# Patient Record
Sex: Male | Born: 1945 | Race: White | Hispanic: No | Marital: Married | Smoking: Former smoker
Health system: Southern US, Community
[De-identification: ages and names within clinical notes are randomized; demographics above are authoritative.]

## PROBLEM LIST (undated history)

## (undated) DIAGNOSIS — E785 Hyperlipidemia, unspecified: Secondary | ICD-10-CM

## (undated) DIAGNOSIS — I4891 Unspecified atrial fibrillation: Secondary | ICD-10-CM

## (undated) DIAGNOSIS — R0600 Dyspnea, unspecified: Secondary | ICD-10-CM

## (undated) DIAGNOSIS — I10 Essential (primary) hypertension: Secondary | ICD-10-CM

## (undated) DIAGNOSIS — I5032 Chronic diastolic (congestive) heart failure: Secondary | ICD-10-CM

## (undated) DIAGNOSIS — N400 Enlarged prostate without lower urinary tract symptoms: Secondary | ICD-10-CM

## (undated) DIAGNOSIS — E119 Type 2 diabetes mellitus without complications: Secondary | ICD-10-CM

## (undated) DIAGNOSIS — G4730 Sleep apnea, unspecified: Secondary | ICD-10-CM

## (undated) DIAGNOSIS — N184 Chronic kidney disease, stage 4 (severe): Secondary | ICD-10-CM

## (undated) DIAGNOSIS — J849 Interstitial pulmonary disease, unspecified: Secondary | ICD-10-CM

## (undated) DIAGNOSIS — T86810 Lung transplant rejection: Secondary | ICD-10-CM

## (undated) DIAGNOSIS — J449 Chronic obstructive pulmonary disease, unspecified: Secondary | ICD-10-CM

## (undated) DIAGNOSIS — R079 Chest pain, unspecified: Secondary | ICD-10-CM

## (undated) HISTORY — DX: Benign prostatic hyperplasia without lower urinary tract symptoms: N40.0

## (undated) HISTORY — DX: Essential (primary) hypertension: I10

## (undated) HISTORY — DX: Hyperlipidemia, unspecified: E78.5

## (undated) HISTORY — PX: LUNG TRANSPLANT: SHX234

## (undated) HISTORY — DX: Chest pain, unspecified: R07.9

## (undated) HISTORY — DX: Dyspnea, unspecified: R06.00

## (undated) HISTORY — DX: Type 2 diabetes mellitus without complications: E11.9

## (undated) HISTORY — PX: BRONCHOSCOPY: SUR163

---

## 2007-06-12 ENCOUNTER — Ambulatory Visit: Payer: Self-pay | Admitting: Family Medicine

## 2008-03-17 ENCOUNTER — Ambulatory Visit: Payer: Self-pay | Admitting: Internal Medicine

## 2008-03-17 ENCOUNTER — Observation Stay (HOSPITAL_COMMUNITY): Admission: EM | Admit: 2008-03-17 | Discharge: 2008-03-18 | Payer: Self-pay | Admitting: Emergency Medicine

## 2008-12-31 DIAGNOSIS — I10 Essential (primary) hypertension: Secondary | ICD-10-CM

## 2008-12-31 DIAGNOSIS — R0602 Shortness of breath: Secondary | ICD-10-CM

## 2008-12-31 DIAGNOSIS — E785 Hyperlipidemia, unspecified: Secondary | ICD-10-CM

## 2008-12-31 DIAGNOSIS — R079 Chest pain, unspecified: Secondary | ICD-10-CM | POA: Insufficient documentation

## 2010-10-12 NOTE — Consult Note (Signed)
NAMEALY, STALTER            ACCOUNT NO.:  [id number]   MEDICAL RECORD NO.:  [id number]          PATIENT TYPE:  INP   LOCATION:                               FACILITY:  [facility]   PHYSICIAN:  Shaune Pascal. Bensimhon, MD     DATE OF BIRTH:   DATE OF CONSULTATION:  03/17/2008  DATE OF DISCHARGE:                                 CONSULTATION   PRIMARY CARE PHYSICIAN/REQUESTING DOCTOR:  Dr. Carlyle Basques.   REASON FOR CONSULTATION:  Chest pain and abnormal EKG.   HISTORY OF PRESENT ILLNESS:  Mr. Caleb [name] is a very pleasant 65 year old  male with a history of obesity, hypertension, hyperlipidemia, and  diabetes.  He has no known history of coronary artery disease.  He did  have a routine stress test and  nuclear stress test about 5 years ago  which he said was done for routine screening.  He really has never had  significant chest pain.  He has never had a cardiac catheterization.   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  He took several aspirin and the pain  went away in about an hour.  Since that time, he has had essentially  chronic chest pressure which is worse with exertion.  He also notes a  marked increase in his baseline dyspnea on exertion.  He is short of  breath now just with mild to moderate activity.  He has also noticed  some mild increase in his chronic lower extremity edema.  He went to see  his primary care doctor today, Dr. Kimber Relic, who got an EKG and he is  referred here for further evaluation.   He denies any orthopnea, no PND.  No fevers, chills, no nausea and  vomiting.  No palpitations.  No syncope or presyncope.  The remainder of  review of systems is negative except for HPI and problem list.   PROBLEM LIST:  1. Hypertension.  2. Borderline hyperlipidemia.  3. Obesity.  4. History of previous normal stress tests which were done for routine      screening.  5. Diabetes x9 years.  6. BPH.   MEDICATIONS:  1. Actos plus metformin 15/850 b.i.d.  2. Enalapril 20 mg a day.  3. Lovastatin 10 a day.  4. Aspirin 81 a day.  5. Flomax 0.4 mg daily.   HE HAD NO KNOWN DRUG ALLERGIES.   SOCIAL HISTORY:  He is married with 3 children.  He lives with his wife  in [place].  He works as a Advertising copywriter.   HISTORY:  Tobacco but quit in 1980.  He drinks occasional beer.   FAMILY HISTORY:  Mother died at 39 from cancer.  Father died at 51 from  myocardial infarction.  He has 2 sisters and his brother.  His brother  is 35 and has history of pacemaker.   PHYSICAL EXAM:  He is in no acute distress.  Ambulates around the clinic  without any respiratory difficulty.  Blood pressure is 152/94.  Heart  rate 67.  HEENT:  Normal.  NECK:  Supple.  He has a thick neck.  No obvious JVD but it is very hard  to tell.  Carotids are 2+ bilaterally without bruits.  There is no  lymphadenopathy or thyromegaly.  CARDIAC:  PMI is nondisplaced.  He had a regular rate and rhythm with  S4.  No murmurs.  LUNGS:  Clear.  ABDOMEN:  Obese, nontender, nondistended.  No hepatosplenomegaly.  No  bruits.  No masses.  EXTREMITIES:  Warm with no cyanosis or clubbing.  There is 2 to 3+ edema  bilaterally.  Distal pulses are 1+ bilaterally.  No rash.  NEURO:  Alert and oriented x3.  Cranial nerves II-XII are intact.  Moves  all 4 extremities without difficulty.  Affect is pleasant.  EKG shows  normal sinus rhythm at a rate of 67.  He has LVH and a left axis  deviation.  There is loss of R-waves from V4 through V6.  There is also  question of small inferior Q-waves.   Bedside echocardiogram shows an ejection fraction of about 55%.  There  is some question of mild hypokinesis of the basilar posterior wall.  Otherwise, no obvious wall motion abnormalities.  His right ventricle  was mildly enlarged but it seems to be functioning well.   ASSESSMENT AND PLAN:  Mr. Mckinnie's symptoms are very  concerning to me  for unstable angina.  We will admit him to the hospital, start him on  aspirin, beta-blocker, nitroglycerin, statin, and heparin.  He will need  a cardiac cath in the morning.  We will also check serial cardiac  markers, as well as a D-dimer.  He will need gentle diuresis.  Given his  mildly enlarged RV, I think it is also reasonable to consider outpatient  workup for sleep apnea.   DISPOSITION:  Pending the results of his heart catheterization.      Shaune Pascal. Bensimhon, MD  Electronically Signed     DRB/MEDQ  D:  03/17/2008  T:  03/17/2008  Job:  [id number]

## 2010-10-12 NOTE — Discharge Summary (Signed)
Caleb [name], [name]            ACCOUNT NO.:  [id number]   MEDICAL RECORD NO.:  [id number]          PATIENT TYPE:  INP   LOCATION:  68                         FACILITY:  [facility]   PHYSICIAN:  Shaune Pascal. Bensimhon, MDDATE OF BIRTH:  01/30/1946   DATE OF ADMISSION:  03/17/2008  DATE OF DISCHARGE:  03/18/2008                               DISCHARGE SUMMARY   PRIMARY CARDIOLOGIST:  Shaune Pascal. Bensimhon, MD   PRIMARY CARE PHYSICIAN:  Treasa School. Erlinda Hong, MD   PROCEDURES PERFORMED DURING HOSPITALIZATION:  Cardiac catheterization  completed on January 17, 2008, by Dr. Glori Bickers revealing normal  coronary arteries and normal left ventricular function with normal right  heart pressures.   FINAL DISCHARGE DIAGNOSES:  1. Noncardiac chest pain.  2. Gastroesophageal reflux disease.  3. Diabetes.  4. Hyperlipidemia.  5. Enalapril.   HOSPITAL COURSE:  This is a 65 year old male with history of  hypertension and hyperlipidemia but no history of CAD who had complaints  of intermittent chest pain and progressive dyspnea on exertion.  Last  echocardiogram revealed an EF of 55% with questionable mild posterior  hypokinesis.  The patient was admitted to have cardiac catheterization  from our office.  Cardiac catheterization was completed and found to be  normal with no evidence of CAD.  The patient was diagnosed with  noncardiac chest pain and started on PPI.  On the day of discharge, the  patient was seen and examined by Dr. Glori Bickers and found to be  stable post-catheterization.  He will return home and follow up with his  primary care physician for continued medical management with no need for  cardiac followup at this time.   DISCHARGE LABORATORY DATA:  Hemoglobin A1c 7.8.  D-dimer 0.27.  Cholesterol 105, lipid 46, HDL 30, and LDL 66.  Troponin 0.01.  Hemoglobin 12.7, hematocrit 38.3, white blood cells 8.6, and platelets  136.  BNP 18.  Chest x-ray, cardiomegaly with minimal  right basilar  atelectasis.   DISCHARGE MEDICATIONS:  Allopurinol, Actos, lovastatin, Flomax, vitamin  D, metformin (which will be held for 48 hours post-cath), and Nexium 40  mg daily, new prescription provided.   FOLLOWUP PLANS AND APPOINTMENTS:  1. The patient has been given post-cardiac catheterization      instructions with particular emphasis on the right groin site for      evidence of bleeding, hematoma, and signs of infection.  2. The patient has no need for cardiology followup at this time.  3. The patient will follow up with primary care physician for      continued medical management.   Time spent with the patient to include physician time, 30 minutes.      Phill Myron. Purcell Nails, NP      [name], MD  Electronically Signed    KML/MEDQ  D:  03/18/2008  T:  03/19/2008  Job:  [id number]   cc:   Treasa School. Kimber Relic, MD

## 2010-10-12 NOTE — Cardiovascular Report (Signed)
NAME:  Caleb [name], Caleb [name]            ACCOUNT NO.:  [id number]   MEDICAL RECORD NO.:  [id number]           PATIENT TYPE:   LOCATION:                                 FACILITY:   PHYSICIAN:  Caleb [name], MDDATE OF BIRTH:  11/01/1945   DATE OF PROCEDURE:  03/18/2008  DATE OF DISCHARGE:                            CARDIAC CATHETERIZATION   PRIMARY CARE PHYSICIAN:  Dr. Carlyle [name], in [place].   Caleb [name] is a very pleasant 65 year old male with multiple cardiac  risk factors including diabetes, hypertension, and hyperlipidemia.  He  presented with a 4- or 5-day history of progressive chest pain and  exercise intolerance.  He was suspicious for unstable angina.  EKG  showed loss of lateral R waves concerning for previous myocardial  infarction.  He was brought to the Catheterization Lab for cardiac  catheterization.   PROCEDURES PERFORMED:  1. Right heart catheterization.  2. Left heart catheterization.  3. Left ventriculogram.  4. Selective coronary angiography.   DESCRIPTION OF PROCEDURE:  The risks and indication of the  catheterization were explained, consent was signed, and placed on the  chart.  A 5-French arterial sheath was placed in the right femoral  artery using a modified Seldinger technique.  Standard catheters  including JL4, JR4, and angled pigtail were used for procedure.  All  catheter exchanges made over wire.  No apparent complications.  A 7-  French venous sheath was placed in the right femoral vein and a standard  Swan-Ganz catheter was used for the right heart cath.  No apparent  complications.   HEMODYNAMICS:  Central aortic pressure 110/69 with a mean of 85.  LV  pressure 111/6 with an EDP of 14.  Right atrial pressure mean of 4.  RV  pressure 27/7.  PA pressure 24/69 with a mean of 22.  Wedge pressure  with a mean of 6.  Fick cardiac output was 5.8.  Cardiac index was 2.6.   Left main was normal.   LAD coursed to the apex gave off 2 diagonals,  was angiographically  normal.   Left circumflex was a large system gave off a large ramus branch,  moderate-sized OM1, and a large OM2, was angiographically normal.   Right coronary artery was a moderate-sized dominant vessel, gave off a  PDA and posterolateral, was angiographically normal.   Left ventriculogram done in the RAO position showed an EF of 55-60% with  no regional wall motion abnormalities.  On panning down over the  abdominal aorta after V-gram, there is no evidence of aneurysmal  dilatation.   ASSESSMENT:  1. Normal coronary arteries.  2. Normal left ventricular function.  3. Normal right heart pressures.  4. No evidence of abdominal aortic aneurysm.   PLAN/DISCUSSION:  Caleb [name]'s chest pain and shortness of breath do  appear noncardiac.  We will check a D-dimer to rule out pulmonary  embolus.  Given his normal right heart pressure, I think this is a low  likelihood.  If everything is negative, we will start a proton pump  inhibitor and plan an outpatient GI workup.      Caleb [name]  Caleb Agee, MD  Electronically Signed     DRB/MEDQ  D:  03/18/2008  T:  03/18/2008  Job:  [id number]   cc:   Caleb Basques, MD

## 2010-11-15 ENCOUNTER — Encounter: Payer: Self-pay | Admitting: Internal Medicine

## 2011-03-01 LAB — COMPREHENSIVE METABOLIC PANEL
AST: 20
BUN: 12
Calcium: 8.6
Glucose, Bld: 128 — ABNORMAL HIGH
Potassium: 3.8
Sodium: 140
Total Bilirubin: 0.8

## 2011-03-01 LAB — GLUCOSE, CAPILLARY
Glucose-Capillary: 150 — ABNORMAL HIGH
Glucose-Capillary: 152 — ABNORMAL HIGH

## 2011-03-01 LAB — CBC
HCT: 38.3 — ABNORMAL LOW
Hemoglobin: 12.7 — ABNORMAL LOW
MCHC: 33.3
MCV: 91.6
Platelets: 136 — ABNORMAL LOW
RBC: 4.45
RDW: 13.4
WBC: 5.9

## 2011-03-01 LAB — POCT I-STAT 3, ART BLOOD GAS (G3+)
Acid-Base Excess: 1
pO2, Arterial: 67 — ABNORMAL LOW

## 2011-03-01 LAB — TROPONIN I
Troponin I: <0.01
Troponin I: <0.01

## 2011-03-01 LAB — CK TOTAL AND CKMB (NOT AT ARMC)
Relative Index: 1.2
Total CK: 151

## 2011-03-01 LAB — APTT: aPTT: 29

## 2011-03-01 LAB — POCT I-STAT 3, VENOUS BLOOD GAS (G3P V)
Acid-base deficit: 2
Bicarbonate: 24.2 — ABNORMAL HIGH
O2 Saturation: 71
TCO2: 22
pH, Ven: 7.226 — ABNORMAL LOW
pH, Ven: 7.339 — ABNORMAL HIGH
pO2, Ven: 39
pO2, Ven: 40

## 2011-03-01 LAB — LIPID PANEL
HDL: 30 — ABNORMAL LOW
LDL Cholesterol: 66

## 2011-03-01 LAB — DIFFERENTIAL
Basophils Absolute: 0
Basophils Relative: 1
Eosinophils Relative: 2
Lymphs Abs: 2.1
Monocytes Relative: 7
Neutro Abs: 3.2

## 2011-03-01 LAB — PROTIME-INR
INR: 1
Prothrombin Time: 13.6

## 2011-03-01 LAB — B-NATRIURETIC PEPTIDE (CONVERTED LAB): Pro B Natriuretic peptide (BNP): 18

## 2011-03-01 LAB — HEMOGLOBIN A1C: Hgb A1c MFr Bld: 7.8 — ABNORMAL HIGH

## 2012-06-13 ENCOUNTER — Ambulatory Visit: Payer: Self-pay | Admitting: Nurse Practitioner

## 2012-06-13 DIAGNOSIS — J849 Interstitial pulmonary disease, unspecified: Secondary | ICD-10-CM | POA: Insufficient documentation

## 2012-06-13 DIAGNOSIS — K219 Gastro-esophageal reflux disease without esophagitis: Secondary | ICD-10-CM | POA: Insufficient documentation

## 2012-06-18 ENCOUNTER — Ambulatory Visit: Payer: Self-pay | Admitting: Nurse Practitioner

## 2012-07-30 ENCOUNTER — Ambulatory Visit: Payer: Self-pay | Admitting: Nurse Practitioner

## 2012-12-13 ENCOUNTER — Encounter: Payer: Self-pay | Admitting: Physician Assistant

## 2012-12-13 ENCOUNTER — Ambulatory Visit (INDEPENDENT_AMBULATORY_CARE_PROVIDER_SITE_OTHER): Payer: BC Managed Care – PPO | Admitting: Physician Assistant

## 2012-12-13 VITALS — BP 148/90 | HR 60 | Temp 97.5°F | Resp 18 | Ht 67.25 in | Wt 223.0 lb

## 2012-12-13 DIAGNOSIS — N400 Enlarged prostate without lower urinary tract symptoms: Secondary | ICD-10-CM

## 2012-12-13 DIAGNOSIS — E119 Type 2 diabetes mellitus without complications: Secondary | ICD-10-CM

## 2012-12-13 DIAGNOSIS — E785 Hyperlipidemia, unspecified: Secondary | ICD-10-CM

## 2012-12-13 DIAGNOSIS — I10 Essential (primary) hypertension: Secondary | ICD-10-CM

## 2012-12-13 LAB — COMPLETE METABOLIC PANEL WITH GFR
AST: 17 U/L (ref 0–37)
BUN: 13 mg/dL (ref 6–23)
Calcium: 9.2 mg/dL (ref 8.4–10.5)
Chloride: 106 mEq/L (ref 96–112)
Creat: 1.1 mg/dL (ref 0.50–1.35)

## 2012-12-13 LAB — HEMOGLOBIN A1C
Hgb A1c MFr Bld: 6 % — ABNORMAL HIGH (ref <5.7)
Mean Plasma Glucose: 126 mg/dL — ABNORMAL HIGH (ref <117)

## 2012-12-13 MED ORDER — AMLODIPINE BESYLATE 5 MG PO TABS: 5.0000 mg | ORAL_TABLET | Freq: Every day | ORAL | Status: DC

## 2012-12-13 NOTE — Progress Notes (Signed)
Patient ID: Caleb [name] MRN: [id number], DOB: March 19, 1946, 67 y.o. Date of Encounter: @DATE @  Chief Complaint:  Chief Complaint  Patient presents with  . new pt  est care  med refills    HPI: 67 y.o. year old white male  presents for initial OV here to establish care here. He says he did go to [place]. Then went to [facility] but they "moved." says he went there 3 times. LOV there was 3 months ago.   1-DM: Says he followed diabetic diet "but then lost weight too fast'-went from 240 to 207 an dfriends asked him what was wrong so he "stopped losing all that weight" and "now is at 220lb"  Checks Fasting AM BS: 100-123. Occasionally checks at other times of day.  Has decreased Glimipide to 1/2 pill b/c when took whole, BS down to 50s.  2-HTN: Takes med as directed. No adv effects 3-HLD: Takes med as directed. No myalgia or other adv effect. 4. BPH: Takes med as directed. No adv effect.   Past Medical History  Diagnosis Date  . Dyspnea   . Chest pain, unspecified        *PT SAYS HE WAS HOSPITALIZED AT  [facility] SEVERAL YEARS AGO. NOT SURE WHAT TESTS WERE DONE BUT CARDIAC TESTS WERE NEGATIVE. I LOOKED IN EPIC BUT NO RECORDS IN EPIC.MYUST HAVE BEEN PRIOR TO EPIC.  Marland Kitchen Hyperlipidemia   . Hypertension   . BPH (benign prostatic hyperplasia)   . Diabetes mellitus without complication      Home Meds: See attached medication section for current medication list. Any medications entered into computer today will not appear on this note's list. The medications listed below were entered prior to today. No current outpatient prescriptions on file prior to visit.   No current facility-administered medications on file prior to visit.    Allergies: No Known Allergies  History   Social History  . Marital Status: Married    Spouse Name: N/A    Number of Children: N/A  . Years of Education: N/A   Occupational History  . Not on file.    Social History Main Topics  . Smoking status: Former Smoker    Types: Cigarettes    Quit date: 05/16/1979  . Smokeless tobacco: Never Used     Comment: Quit 1980  . Alcohol Use: Yes  . Drug Use: No  . Sexually Active: Not on file   Other Topics Concern  . 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  Family History  Problem Relation Age of Onset  . Coronary artery disease Other   . Diabetes Other      Review of Systems:  See HPI for pertinent ROS. All other ROS negative.    Physical Exam: Blood pressure 148/90, pulse 60, temperature 97.5 F (36.4 C), temperature source Oral, resp. rate 18, height 5' 7.25" (1.708 m), weight 223 lb (101.152 kg)., Body mass index is 34.67 kg/(m^2). General: WNWD WM.Appears in no acute distress. Neck: Supple. No thyromegaly. No lymphadenopathy. No carotid bruits Lungs: Clear bilaterally to auscultation without wheezes, rales, or rhonchi. Breathing is unlabored. Heart: RRR with S1 S2. No murmurs, rubs, or gallops. Abdomen: Soft, non-tender, non-distended with normoactive bowel sounds. No hepatomegaly. No rebound/guarding. No obvious abdominal masses. Musculoskeletal:  Strength and tone normal for age. Extremities/Skin: Warm and dry. No clubbing or cyanosis. No edema. No  rashes or suspicious lesions. Neuro: Alert and oriented X 3. Moves all extremities spontaneously. Gait is normal. CNII-XII grossly in tact. Psych:  Responds to questions appropriately with a normal affect. DIABETIC FOOT EXAM; SEE ATTACHED SECTION     ASSESSMENT AND PLAN:  67 y.o. year old male with  1. Diabetes mellitus without complication Cont current meds now. Check labs . He has annual eye exams.  - COMPLETE METABOLIC PANEL WITH GFR - Hemoglobin A1c - Microalbumin, urine  2. Hyperlipidemia Cont current meds now. Pt is not fasting and cannot RTC fasting in near future sec to work schedule. Will  check other labs now. When he returns in 3 monsht, will come fasting and check FLP then. - COMPLETE METABOLIC PANEL WITH GFR  3. Hypertension He checks BP at home and has been getting high readings there also. Will go ahead and add Norvasc. Cont current meds the same.  - COMPLETE METABOLIC PANEL WITH GFR - amLODipine (NORVASC) 5 MG tablet; Take 1 tablet (5 mg total) by mouth daily.  Dispense: 90 tablet; Refill: 3  4. BPH (benign prostatic hyperplasia) Cont current meds. Check PSA. - PSA  ROV 3 months. Come fasting so can check FLP at that time.  F/U sooner if needed.  [address] [place], [place], [facility] 12/13/2012 10:28 AM

## 2012-12-14 ENCOUNTER — Encounter: Payer: Self-pay | Admitting: Family Medicine

## 2013-01-24 ENCOUNTER — Telehealth: Payer: Self-pay | Admitting: Physician Assistant

## 2013-01-24 MED ORDER — PANTOPRAZOLE SODIUM 40 MG PO TBEC: 40.0000 mg | DELAYED_RELEASE_TABLET | Freq: Every day | ORAL | Status: DC

## 2013-01-24 NOTE — Telephone Encounter (Signed)
Medication refilled per protocol. 

## 2013-02-22 ENCOUNTER — Other Ambulatory Visit: Payer: Self-pay | Admitting: Physician Assistant

## 2013-02-22 NOTE — Telephone Encounter (Signed)
One month refill future appt pending

## 2013-02-28 ENCOUNTER — Other Ambulatory Visit: Payer: Self-pay | Admitting: Physician Assistant

## 2013-02-28 NOTE — Telephone Encounter (Signed)
One month refill sent .  Pt has 3 mth f/u appt later this month

## 2013-03-18 ENCOUNTER — Encounter: Payer: Self-pay | Admitting: Physician Assistant

## 2013-03-18 ENCOUNTER — Ambulatory Visit (INDEPENDENT_AMBULATORY_CARE_PROVIDER_SITE_OTHER): Payer: BC Managed Care – PPO | Admitting: Physician Assistant

## 2013-03-18 VITALS — BP 118/80 | HR 78 | Temp 97.1°F | Resp 18 | Wt 220.5 lb

## 2013-03-18 DIAGNOSIS — N400 Enlarged prostate without lower urinary tract symptoms: Secondary | ICD-10-CM

## 2013-03-18 DIAGNOSIS — E785 Hyperlipidemia, unspecified: Secondary | ICD-10-CM

## 2013-03-18 DIAGNOSIS — I10 Essential (primary) hypertension: Secondary | ICD-10-CM

## 2013-03-18 DIAGNOSIS — Z23 Encounter for immunization: Secondary | ICD-10-CM

## 2013-03-18 DIAGNOSIS — E119 Type 2 diabetes mellitus without complications: Secondary | ICD-10-CM

## 2013-03-18 LAB — COMPLETE METABOLIC PANEL WITH GFR
ALT: 21 U/L (ref 0–53)
AST: 17 U/L (ref 0–37)
Albumin: 4.1 g/dL (ref 3.5–5.2)
CO2: 25 mEq/L (ref 19–32)
Calcium: 9.1 mg/dL (ref 8.4–10.5)
Chloride: 106 mEq/L (ref 96–112)
Creat: 1.01 mg/dL (ref 0.50–1.35)
GFR, Est African American: >89 mL/min
GFR, Est Non African American: 78 mL/min
Glucose, Bld: 122 mg/dL — ABNORMAL HIGH (ref 70–99)
Total Bilirubin: 1 mg/dL (ref 0.3–1.2)

## 2013-03-18 LAB — LIPID PANEL
HDL: 38 mg/dL — ABNORMAL LOW (ref >39)
LDL Cholesterol: 74 mg/dL (ref 0–99)
Triglycerides: 97 mg/dL (ref <150)

## 2013-03-18 LAB — HEMOGLOBIN A1C
Hgb A1c MFr Bld: 6.4 % — ABNORMAL HIGH (ref <5.7)
Mean Plasma Glucose: 137 mg/dL — ABNORMAL HIGH (ref <117)

## 2013-03-18 NOTE — Progress Notes (Signed)
Patient ID: Caleb [name] MRN: [id number], DOB: 16-Jul-1945, 67 y.o. Date of Encounter: @DATE @  Chief Complaint:  Chief Complaint  Patient presents with  . 3 mos follow up  . Blood work    HPI: 67 y.o. year old white male  presents for a routine followup office visit.  I saw him as a new patient to our office to establish care here on 12/13/12. Prior to that he had gone to [facility] with Dr.  Darliss Ridgel. Then he went to [facility] but they  'moved." His last office visit with them was just 3 months prior to his initial visit with me in July.  #1 diabetes: His with me in July he stated that he was following a diabetic diet "but then lost weight too fast and went from 240-207 and friends asked him what was wrong so he stopped losing all that weight." Today I asked him about his diet. He says  the biggest thing was he has stopped bread and stopped soda. Says he does follow the diabetic diet for the most part. Reports fasting morning blood sugar is about 105-132 2 hours postprandials about 147.  #2 hypertension: Last office visit his blood pressure was slightly elevated. He reported that he was also getting slightly high readings at home. Therefore we went ahead and added Norvasc 5 mg. He says that he did add Norvasc. He is taking it daily with no adverse effects. Says he is getting about 120/80 for his blood pressure readings at home.  3 hyperlipidemia: He takes medication as directed. No myalgias or other adverse effects.  4 BPH: He is taking his medications as directed. Has no adverse effects. His symptoms are well-controlled with medications.  He went on vacation for one week last week. He says that he was noncompliant with diet during that time. however states that he did have time to do a lot of walking --Felt good with this.   Past Medical History  Diagnosis Date  . Dyspnea   . Chest pain, unspecified   . Hyperlipidemia   . Hypertension   . BPH  (benign prostatic hyperplasia)   . Diabetes mellitus without complication      Home Meds: See attached medication section for current medication list. Any medications entered into computer today will not appear on this note's list. The medications listed below were entered prior to today. Current Outpatient Prescriptions on File Prior to Visit  Medication Sig Dispense Refill  . amLODipine (NORVASC) 5 MG tablet Take 1 tablet (5 mg total) by mouth daily.  90 tablet  3  . aspirin 81 MG tablet Take 81 mg by mouth daily.      . Cholecalciferol (D3 MAXIMUM STRENGTH) 5000 UNITS capsule Take 5,000 Units by mouth daily.      Marland Kitchen dutasteride (AVODART) 0.5 MG capsule Take 0.5 mg by mouth daily.      . enalapril (VASOTEC) 20 MG tablet Take 20 mg by mouth daily.      Marland Kitchen glimepiride (AMARYL) 2 MG tablet Take 2 mg by mouth daily before breakfast.      . KOMBIGLYZE XR 2.09-998 MG TB24 TAKE 2 TABLETS BY MOUTH EVERY DAY  60 tablet  0  . lovastatin (MEVACOR) 20 MG tablet TAKE 1 TABLET BY MOUTH ONCE A DAY  30 tablet  0  . meloxicam (MOBIC) 7.5 MG tablet Take 7.5 mg by mouth daily.      . pantoprazole (PROTONIX) 40 MG tablet Take 1 tablet (40  mg total) by mouth daily.  30 tablet  3  . tamsulosin (FLOMAX) 0.4 MG CAPS Take 0.4 mg by mouth daily after breakfast.       No current facility-administered medications on file prior to visit.    Allergies: No Known Allergies  History   Social History  . Marital Status: Married    Spouse Name: N/A    Number of Children: N/A  . Years of Education: N/A   Occupational History  . Not on file.   Social History Main Topics  . Smoking status: Former Smoker    Types: Cigarettes    Quit date: 05/16/1979  . Smokeless tobacco: Never Used     Comment: Quit 1980  . Alcohol Use: Yes  . Drug Use: No  . Sexual Activity: Not on file   Other Topics Concern  . 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     Family  History  Problem Relation Age of Onset  . Coronary artery disease Other   . Diabetes Other      Review of Systems:  See HPI for pertinent ROS. All other ROS negative.    Physical Exam: Blood pressure 118/80, pulse 78, temperature 97.1 F (36.2 C), temperature source Oral, resp. rate 18, weight 220 lb 8 oz (100.018 kg)., Body mass index is 34.28 kg/(m^2). General: Well-nourished well-developed white male . Appears in no acute distress. Head: Normocephalic, atraumatic, eyes without discharge, sclera non-icteric, nares are without discharge. Bilateral auditory canals clear, TM's are without perforation, pearly grey and translucent with reflective cone of light bilaterally. Oral cavity moist, posterior pharynx without exudate, erythema, peritonsillar abscess, or post nasal drip.  Neck: Supple. No thyromegaly. No lymphadenopathy. No carotid bruits. Lungs: Clear bilaterally to auscultation without wheezes, rales, or rhonchi. Breathing is unlabored. Heart: RRR with S1 S2. No murmurs, rubs, or gallops. Abdomen: Soft, non-tender, non-distended with normoactive bowel sounds. No hepatomegaly. No rebound/guarding. No obvious abdominal masses. Musculoskeletal:  Strength and tone normal for age. Extremities/Skin: Warm and dry. No clubbing or cyanosis. No edema. No rashes or suspicious lesions. Neuro: Alert and oriented X 3. Moves all extremities spontaneously. Gait is normal. CNII-XII grossly in tact. Psych:  Responds to questions appropriately with a normal affect. Diabetic foot exam:  Inspection is normal. Sensation is normal.  2+ bilateral dorsalis pedis pulses. 1+ bilateral posterior tibial pulses      ASSESSMENT AND PLAN:  67 y.o. year old male with  1. Diabetes mellitus without complication - COMPLETE METABOLIC PANEL WITH GFR - Hemoglobin A1c Microalbumin normal 7/14. He has annual eye exam. Diabetic foot exam is within normal limits.  2. Hyperlipidemia - COMPLETE METABOLIC PANEL WITH  GFR - Lipid panel  3. Hypertension BP is now at goal on  Current medications. - COMPLETE METABOLIC PANEL WITH GFR  4. BPH (benign prostatic hyperplasia) - COMPLETE METABOLIC PANEL WITH GFR  5. greening colonoscopy: He reports he has had one colonoscopy in the past. He thinks it was about 7 or 8 years ago. On the one hand says that they told him it looked good. On the other hand he says that they saw a couple of small polyps. He does not know when he is supposed to have a repeat colonoscopy. Says that that colonoscopy was done at [facility]. He will talk to his wife about this as far as trying to find a copy of the report etc.  #6 screening PSA: This  was done in 12/13/12 and was normal  #7 immunizations: Pneumovax: He is now age 89. He says that he does not recall having any Pneumovax before and especially definitely has not had one in the past year since being 89. Therefore we'll give this today. Influenza vaccine: We'll get this today. Tetanus vaccine: He cannot remember the last 20 has and is quite certain it's been at least 10 years. He is due for this. However since forgetting the name of the knee and flu vaccine today we will await and give tetanus vaccine at his next visit.  Regular office visit in 3 months. GIVE TETANUS VACCINE AT NEXT OV  Signed, [address] [place], [place], [facility] 03/18/2013 8:33 AM

## 2013-03-25 ENCOUNTER — Encounter: Payer: Self-pay | Admitting: Family Medicine

## 2013-04-01 ENCOUNTER — Other Ambulatory Visit: Payer: Self-pay | Admitting: Physician Assistant

## 2013-04-01 NOTE — Telephone Encounter (Signed)
Medication refilled per protocol. 

## 2013-04-07 ENCOUNTER — Emergency Department: Payer: Self-pay | Admitting: Emergency Medicine

## 2013-04-07 LAB — CBC
HCT: 43 % (ref 40.0–52.0)
HGB: 14.8 g/dL (ref 13.0–18.0)
MCH: 30.6 pg (ref 26.0–34.0)
MCHC: 34.4 g/dL (ref 32.0–36.0)
RBC: 4.84 10*6/uL (ref 4.40–5.90)
RDW: 13.2 % (ref 11.5–14.5)
WBC: 8.5 10*3/uL (ref 3.8–10.6)

## 2013-04-07 LAB — COMPREHENSIVE METABOLIC PANEL
Albumin: 3.5 g/dL (ref 3.4–5.0)
Anion Gap: 5 — ABNORMAL LOW (ref 7–16)
BUN: 14 mg/dL (ref 7–18)
Chloride: 110 mmol/L — ABNORMAL HIGH (ref 98–107)
EGFR (African American): >60
EGFR (Non-African Amer.): >60
Potassium: 3.8 mmol/L (ref 3.5–5.1)
SGPT (ALT): 31 U/L (ref 12–78)
Sodium: 141 mmol/L (ref 136–145)
Total Protein: 7.5 g/dL (ref 6.4–8.2)

## 2013-04-07 LAB — URINALYSIS, COMPLETE
Bilirubin,UR: NEGATIVE
Blood: NEGATIVE
Glucose,UR: NEGATIVE mg/dL (ref 0–75)
Ketone: NEGATIVE
Leukocyte Esterase: NEGATIVE
Protein: NEGATIVE
Specific Gravity: 1.014 (ref 1.003–1.030)
Squamous Epithelial: NONE SEEN

## 2013-04-07 LAB — TROPONIN I: Troponin-I: <0.02 ng/mL

## 2013-04-07 LAB — PRO B NATRIURETIC PEPTIDE: B-Type Natriuretic Peptide: 53 pg/mL (ref 0–125)

## 2013-04-11 ENCOUNTER — Other Ambulatory Visit: Payer: Self-pay | Admitting: Physician Assistant

## 2013-04-29 ENCOUNTER — Encounter: Payer: Self-pay | Admitting: Physician Assistant

## 2013-04-29 ENCOUNTER — Ambulatory Visit (INDEPENDENT_AMBULATORY_CARE_PROVIDER_SITE_OTHER): Payer: BC Managed Care – PPO | Admitting: Physician Assistant

## 2013-04-29 VITALS — BP 122/86 | HR 72 | Temp 97.4°F | Resp 18 | Wt 223.0 lb

## 2013-04-29 DIAGNOSIS — R0602 Shortness of breath: Secondary | ICD-10-CM

## 2013-04-29 NOTE — Progress Notes (Signed)
Patient ID: Caleb [name] MRN: [id number], DOB: [birth year], 67 y.o. Date of Encounter: @DATE @  Chief Complaint:  Chief Complaint  Patient presents with  . c/o SOB, fatique x 1 mth    seen [place]  lung mass/chronic lung infection, seen Pulm in [place] has had on levaquin    HPI: 67 y.o. year old white male  presents with his wife today.  I have seen the patient twice before.  I saw him as a new patient to establish care here on 12/13/12. I then saw him again for followup visit on 03/18/13. I have been seeing him for diabetes, hypertension, hyperlipidemia. When he came here for his initial visit he said that prior to that he had gone to [facility] with Dr. Brunetta Genera. Then he went to [facility] family practice but they "moved."  Today he and his wife report the following information. Records are currently pending. None of the records are in the epic computer system.  He reports that he had been experiencing shortness of breath with exertion for approximately one month. However it worsened.   Also, he had an episode where he was laying down working on a truck and felt kind of lightheaded. He then started to go in the house and "felt staggery as if he was drunk" when he was walking. His wife was in the house and saw him " walking as if he was drunk".  She took him to the [facility] walk[facility]in [facility]. This was on 04/07/13. They report that they did a chest x-ray there which showed a " mass in the lung ". They sent him to the emergency room where they did a CT scan. This was also on 04/07/13. They say that the CT scan showed "chronic lung infection". They report that the ER prescribed Levaquin 750 mg daily x7 days. He took this from 11/10 through 11/17.  As well the ER had him follow up with Dr. Chancy Milroy a pulmonologist in [place]. This visit was on 04/08/13. He told the patient to take the Levaquin as prescribed from the ER. As well he gave him samples of BREO.  Patient states that he  felt better while on the antibiotics. However after he completed the antibiotics the symptoms began to recur.  He says that he went for an echocardiogram at Dr. Ruben Gottron on 04/18/13. Patient states that he has not received the results from that test.  Patient states that because the symptoms were returning once he had gotten off of the antibiotics he had called Dr. Marella Bile office on the morning of 11/26. However he got no call back until he got a call on the evening of 11/28 from Dr. Marella Bile nurse. Patient states that he had had some cramping in his hands on the Levaquin 750 mg. Therefore they prescribed a lower dose. They prescribed Levaquin 500 mg daily x10 days.  Patient has now been back on the Levaquin for 4 days. He says that his breathing is improving.  His wife states that what happens is if he does any exertion he becomes short of breath and starts to cough and sweats and coughs up small amounts of mucus/phlegm. She says that sometimes he continues the cycle of cough, shortness of breath, cough for 30 minutes. He has had no fevers or chills. He reports that he is scheduled to go for pulmonary function tests at Dr. Ruben Gottron office on 05/01/13. His next appointment with Dr. Carren Rang is not until 05/20/13. Patient says that he came here because  he called [place] and we were able to get him an appointment to come in, whereas when he called Dr. Ruben Gottron office he had trouble getting through with them. As well he says that he feels as if they are not telling him everything. If he would rather have [place] manage this problem.   Past Medical History  Diagnosis Date  . Dyspnea   . Chest pain, unspecified   . Hyperlipidemia   . Hypertension   . BPH (benign prostatic hyperplasia)   . Diabetes mellitus without complication      Home Meds: See attached medication section for current medication list. Any medications entered into computer today will not appear on this note's list. The medications listed below were  entered prior to today. Current Outpatient Prescriptions on File Prior to Visit  Medication Sig Dispense Refill  . amLODipine (NORVASC) 5 MG tablet Take 1 tablet (5 mg total) by mouth daily.  90 tablet  3  . aspirin 81 MG tablet Take 81 mg by mouth daily.      . Cholecalciferol (D3 MAXIMUM STRENGTH) 5000 UNITS capsule Take 5,000 Units by mouth daily.      Marland Kitchen dutasteride (AVODART) 0.5 MG capsule Take 0.5 mg by mouth daily.      . enalapril (VASOTEC) 20 MG tablet TAKE 1 TABLET BY MOUTH EVERY DAY  30 tablet  3  . glimepiride (AMARYL) 2 MG tablet Take 2 mg by mouth daily before breakfast.      . KOMBIGLYZE XR 2.09-998 MG TB24 TAKE 2 TABLETS BY MOUTH EVERY DAY  60 tablet  2  . lovastatin (MEVACOR) 20 MG tablet TAKE 1 TABLET BY MOUTH EVERY DAY  30 tablet  2  . meloxicam (MOBIC) 7.5 MG tablet Take 7.5 mg by mouth daily.      . pantoprazole (PROTONIX) 40 MG tablet Take 1 tablet (40 mg total) by mouth daily.  30 tablet  3  . tamsulosin (FLOMAX) 0.4 MG CAPS Take 0.4 mg by mouth daily after breakfast.       No current facility-administered medications on file prior to visit.    Allergies: No Known Allergies  History   Social History  . Marital Status: Married    Spouse Name: N/A    Number of Children: N/A  . Years of Education: N/A   Occupational History  . Not on file.   Social History Main Topics  . Smoking status: Former Smoker    Types: Cigarettes    Quit date: 05/16/1979  . Smokeless tobacco: Never Used     Comment: Quit 1980  . Alcohol Use: Yes  . Drug Use: No  . Sexual Activity: Not on file   Other Topics Concern  . 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     Family History  Problem Relation Age of Onset  . Coronary artery disease Other   . Diabetes Other      Review of Systems:  See HPI for pertinent ROS. All other ROS negative.    Physical Exam: Blood pressure 122/86, pulse 72, temperature 97.4 F (36.3 C),  temperature source Oral, resp. rate 18, weight 223 lb (101.152 kg), SpO2 94.00%., Body mass index is 34.67 kg/(m^2). General: WNWD WM. Appears in no acute distress. Lungs: Clear bilaterally to auscultation without wheezes, rales, or rhonchi. Breathing is unlabored. Heart: RRR with S1 S2. No murmurs, rubs, or gallops. Musculoskeletal:  Strength and tone normal for age. Extremities/Skin:  Warm and dry. No clubbing or cyanosis. No edema. No rashes or suspicious lesions. Neuro: Alert and oriented X 3. Moves all extremities spontaneously. Gait is normal. CNII-XII grossly in tact. Psych:  Responds to questions appropriately with a normal affect.     ASSESSMENT AND PLAN:  67 y.o. year old male with  1. DYSPNEA His lungs are pretty clear right now. I hear no wheezing. Good air movement and breath sounds. Patient has signed release for all of the records. I will follow up on making sure that we get all of these records.-- Including CT from the ER, echo report from Dr. Marella Bile office, office visit from Dr. Carren Rang. I told patient to keep the appointment for the pulmonary function tests scheduled at Dr. Marella Bile office. I will follow the patient as soon as I get these records. Otherwise continue current treatment in the meantime.  30 minutes spent with the patient and his wife as well as time obtaining and reviewing all the records. Additionally, once I obtain these records I will have to notify the patient and discuss results with him.  [address] [place], [place], [facility] 04/29/2013 11:39 AM

## 2013-05-02 ENCOUNTER — Telehealth: Payer: Self-pay | Admitting: Physician Assistant

## 2013-05-02 NOTE — Telephone Encounter (Signed)
See my office note dated 04/29/13 for details about this issue. At that time the patient had recently gone to urgent care, then emergency room then Dr. Yancey Flemings pulmonologist in [place]. At the end of that visit, I told the patient that I would obtain records and review them and follow up with him. Have obtained and reviewed all of the records. This included records from the urgent care, ER, and Dr. Yancey Flemings. I reviewed reports of head CT, chest CT, echocardiogram.  I told him to continue to followup with Dr. Yancey Flemings as scheduled. I saw nothing to add to the evaluation and treatment that was already being done by Dr. Chancy Milroy.  Patient was agreeable with this approach. He will continue to follow  Up with  Dr. Yancey Flemings for this problem.

## 2013-05-14 ENCOUNTER — Other Ambulatory Visit: Payer: Self-pay | Admitting: Internal Medicine

## 2013-05-14 DIAGNOSIS — K219 Gastro-esophageal reflux disease without esophagitis: Secondary | ICD-10-CM

## 2013-05-16 ENCOUNTER — Other Ambulatory Visit: Payer: Self-pay | Admitting: Internal Medicine

## 2013-05-16 ENCOUNTER — Ambulatory Visit
Admission: RE | Admit: 2013-05-16 | Discharge: 2013-05-16 | Disposition: A | Discharge status: Home / Self Care | Payer: Medicare Other | Source: Ambulatory Visit | Attending: Internal Medicine | Admitting: Internal Medicine

## 2013-05-16 DIAGNOSIS — K219 Gastro-esophageal reflux disease without esophagitis: Secondary | ICD-10-CM

## 2013-05-20 ENCOUNTER — Other Ambulatory Visit: Payer: Self-pay | Admitting: Physician Assistant

## 2013-05-20 NOTE — Telephone Encounter (Signed)
Medication refilled per protocol. 

## 2013-05-26 ENCOUNTER — Other Ambulatory Visit: Payer: Self-pay | Admitting: Physician Assistant

## 2013-06-06 ENCOUNTER — Other Ambulatory Visit: Payer: Self-pay | Admitting: Physician Assistant

## 2013-06-06 NOTE — Telephone Encounter (Signed)
Medication refilled per protocol. 

## 2013-06-08 ENCOUNTER — Other Ambulatory Visit: Payer: Self-pay | Admitting: Physician Assistant

## 2013-06-14 ENCOUNTER — Ambulatory Visit: Payer: Self-pay | Admitting: Internal Medicine

## 2013-06-17 ENCOUNTER — Emergency Department (HOSPITAL_COMMUNITY): Payer: Medicare Other

## 2013-06-17 ENCOUNTER — Encounter (HOSPITAL_COMMUNITY): Payer: Self-pay | Admitting: Emergency Medicine

## 2013-06-17 ENCOUNTER — Ambulatory Visit: Payer: BC Managed Care – PPO | Admitting: Physician Assistant

## 2013-06-17 ENCOUNTER — Emergency Department (HOSPITAL_COMMUNITY)
Admission: EM | Admit: 2013-06-17 | Discharge: 2013-06-17 | Disposition: A | Discharge status: Home / Self Care | Payer: Medicare Other | Attending: Emergency Medicine | Admitting: Emergency Medicine

## 2013-06-17 DIAGNOSIS — R0989 Other specified symptoms and signs involving the circulatory and respiratory systems: Secondary | ICD-10-CM | POA: Insufficient documentation

## 2013-06-17 DIAGNOSIS — Z87891 Personal history of nicotine dependence: Secondary | ICD-10-CM | POA: Insufficient documentation

## 2013-06-17 DIAGNOSIS — Z79899 Other long term (current) drug therapy: Secondary | ICD-10-CM | POA: Insufficient documentation

## 2013-06-17 DIAGNOSIS — E119 Type 2 diabetes mellitus without complications: Secondary | ICD-10-CM | POA: Insufficient documentation

## 2013-06-17 DIAGNOSIS — R079 Chest pain, unspecified: Secondary | ICD-10-CM | POA: Insufficient documentation

## 2013-06-17 DIAGNOSIS — E785 Hyperlipidemia, unspecified: Secondary | ICD-10-CM | POA: Insufficient documentation

## 2013-06-17 DIAGNOSIS — R110 Nausea: Secondary | ICD-10-CM | POA: Insufficient documentation

## 2013-06-17 DIAGNOSIS — R109 Unspecified abdominal pain: Secondary | ICD-10-CM

## 2013-06-17 DIAGNOSIS — I10 Essential (primary) hypertension: Secondary | ICD-10-CM | POA: Insufficient documentation

## 2013-06-17 DIAGNOSIS — J8410 Pulmonary fibrosis, unspecified: Secondary | ICD-10-CM

## 2013-06-17 DIAGNOSIS — J84112 Idiopathic pulmonary fibrosis: Secondary | ICD-10-CM | POA: Insufficient documentation

## 2013-06-17 DIAGNOSIS — R0609 Other forms of dyspnea: Secondary | ICD-10-CM | POA: Insufficient documentation

## 2013-06-17 DIAGNOSIS — R10819 Abdominal tenderness, unspecified site: Secondary | ICD-10-CM | POA: Insufficient documentation

## 2013-06-17 LAB — URINALYSIS, ROUTINE W REFLEX MICROSCOPIC
Bilirubin Urine: NEGATIVE
Glucose, UA: NEGATIVE mg/dL
Hgb urine dipstick: NEGATIVE
KETONES UR: NEGATIVE mg/dL
LEUKOCYTES UA: NEGATIVE
NITRITE: NEGATIVE
PROTEIN: NEGATIVE mg/dL
Specific Gravity, Urine: 1.006 (ref 1.005–1.030)
UROBILINOGEN UA: 0.2 mg/dL (ref 0.0–1.0)
pH: 5.5 (ref 5.0–8.0)

## 2013-06-17 LAB — CBC WITH DIFFERENTIAL/PLATELET
Basophils Absolute: 0 10*3/uL (ref 0.0–0.1)
Basophils Relative: 0 % (ref 0–1)
Eosinophils Absolute: 0.2 10*3/uL (ref 0.0–0.7)
Eosinophils Relative: 2 % (ref 0–5)
HEMATOCRIT: 40.8 % (ref 39.0–52.0)
HEMOGLOBIN: 14.4 g/dL (ref 13.0–17.0)
LYMPHS ABS: 2.2 10*3/uL (ref 0.7–4.0)
Lymphocytes Relative: 22 % (ref 12–46)
MCH: 30.4 pg (ref 26.0–34.0)
MCHC: 35.3 g/dL (ref 30.0–36.0)
MCV: 86.3 fL (ref 78.0–100.0)
MONOS PCT: 7 % (ref 3–12)
Monocytes Absolute: 0.7 10*3/uL (ref 0.1–1.0)
NEUTROS PCT: 68 % (ref 43–77)
Neutro Abs: 6.7 10*3/uL (ref 1.7–7.7)
Platelets: 166 10*3/uL (ref 150–400)
RBC: 4.73 MIL/uL (ref 4.22–5.81)
RDW: 12.7 % (ref 11.5–15.5)
WBC: 9.9 10*3/uL (ref 4.0–10.5)

## 2013-06-17 LAB — COMPREHENSIVE METABOLIC PANEL
ALK PHOS: 69 U/L (ref 39–117)
ALT: 19 U/L (ref 0–53)
AST: 16 U/L (ref 0–37)
Albumin: 3.1 g/dL — ABNORMAL LOW (ref 3.5–5.2)
BILIRUBIN TOTAL: 0.4 mg/dL (ref 0.3–1.2)
BUN: 15 mg/dL (ref 6–23)
CHLORIDE: 103 meq/L (ref 96–112)
CO2: 20 mEq/L (ref 19–32)
Calcium: 8.3 mg/dL — ABNORMAL LOW (ref 8.4–10.5)
Creatinine, Ser: 0.97 mg/dL (ref 0.50–1.35)
GFR, EST NON AFRICAN AMERICAN: 85 mL/min — AB (ref >90)
Glucose, Bld: 219 mg/dL — ABNORMAL HIGH (ref 70–99)
POTASSIUM: 4.3 meq/L (ref 3.7–5.3)
Sodium: 137 mEq/L (ref 137–147)
Total Protein: 6.7 g/dL (ref 6.0–8.3)

## 2013-06-17 LAB — LIPASE, BLOOD: Lipase: 46 U/L (ref 11–59)

## 2013-06-17 MED ORDER — DEXTROSE 50 % IV SOLN
INTRAVENOUS | Status: AC
  Filled 2013-06-17: qty 50

## 2013-06-17 MED ORDER — IOHEXOL 300 MG/ML  SOLN
80.0000 mL | Freq: Once | INTRAMUSCULAR | Status: AC | PRN
  Administered 2013-06-17: 80 mL via INTRAVENOUS

## 2013-06-17 MED ORDER — SODIUM CHLORIDE 0.9 % IV BOLUS (SEPSIS)
1000.0000 mL | Freq: Once | INTRAVENOUS | Status: AC
  Administered 2013-06-17: 1000 mL via INTRAVENOUS

## 2013-06-17 MED ORDER — ONDANSETRON 4 MG PO TBDP: 4.0000 mg | ORAL_TABLET | Freq: Three times a day (TID) | ORAL | Status: DC | PRN

## 2013-06-17 MED ORDER — IOHEXOL 300 MG/ML  SOLN
25.0000 mL | INTRAMUSCULAR | Status: AC
  Administered 2013-06-17: 25 mL via ORAL

## 2013-06-17 MED ORDER — MORPHINE SULFATE 4 MG/ML IJ SOLN: 4.0000 mg | Freq: Once | INTRAMUSCULAR | Status: DC

## 2013-06-17 MED ORDER — ONDANSETRON HCL 4 MG/2ML IJ SOLN
4.0000 mg | Freq: Once | INTRAMUSCULAR | Status: AC
  Administered 2013-06-17: 4 mg via INTRAVENOUS
  Filled 2013-06-17: qty 2

## 2013-06-17 NOTE — ED Provider Notes (Signed)
CSN: [id number]     Arrival date & time 06/17/13  [id number] History   First MD Initiated Contact with Patient 06/17/13 [phone]     Chief Complaint  Patient presents with  . Abdominal Pain   (Consider location/radiation/quality/duration/timing/severity/associated sxs/prior Treatment) HPI Comments: Patient is a 68 yo M PHMx significant for HLD, HTN, BPH, DM presenting to the ED for acute onset severe burning cramping lower abdominal pain that began around 6AM this morning after eating breakfast. The patient states he had associated chills and nausea. He states that abdominal pain has subsided, but he continues to have some nausea. Patient denies any history of this abdominal pain in the past. He states he is being followed by his doctor for recurrent lung infections that he was placed on month long antibiotics two months ago. He had a CT of his chest on Friday at [facility] outpatient imaging center. Patient denies any history of abdominal surgeries. He denies any fevers, emesis, diarrhea, urinary symptoms, CP, new or worsening SOB or cough from chronic SOB and cough. No other acute changes with chronic pulmonary issues.    Patient is a 68 y.o. male presenting with abdominal pain.  Abdominal Pain Associated symptoms: chills and nausea   Associated symptoms: no chest pain, no constipation, no diarrhea, no fever, no shortness of breath and no vomiting     Past Medical History  Diagnosis Date  . Dyspnea   . Chest pain, unspecified   . Hyperlipidemia   . Hypertension   . BPH (benign prostatic hyperplasia)   . Diabetes mellitus without complication    History reviewed. No pertinent past surgical history. Family History  Problem Relation Age of Onset  . Coronary artery disease Other   . Diabetes Other    History  Substance Use Topics  . Smoking status: Former Smoker    Types: Cigarettes    Quit date: 05/16/1979  . Smokeless tobacco: Never Used     Comment: Quit 1980  . Alcohol Use: Yes     Review of Systems  Constitutional: Positive for chills. Negative for fever.  Respiratory: Negative for shortness of breath.   Cardiovascular: Negative for chest pain and leg swelling.  Gastrointestinal: Positive for nausea and abdominal pain. Negative for vomiting, diarrhea, constipation, blood in stool, abdominal distention, anal bleeding and rectal pain.  Genitourinary: Negative.   All other systems reviewed and are negative.    Allergies  Review of patient's allergies indicates no known allergies.  Home Medications   Current Outpatient Rx  Name  Route  Sig  Dispense  Refill  . amLODipine (NORVASC) 5 MG tablet   Oral   Take 1 tablet (5 mg total) by mouth daily.   90 tablet   3   . aspirin 81 MG tablet   Oral   Take 81 mg by mouth daily.         . benzonatate (TESSALON) 100 MG capsule   Oral   Take 100 mg by mouth 3 (three) times daily as needed for cough.         . Cholecalciferol (D3 MAXIMUM STRENGTH) 5000 UNITS capsule   Oral   Take 5,000 Units by mouth daily.         Marland Kitchen dutasteride (AVODART) 0.5 MG capsule   Oral   Take 0.5 mg by mouth daily.         . enalapril (VASOTEC) 20 MG tablet   Oral   Take 20 mg by mouth daily.         Marland Kitchen  glimepiride (AMARYL) 2 MG tablet   Oral   Take 2 mg by mouth daily with breakfast.         . lovastatin (MEVACOR) 20 MG tablet   Oral   Take 20 mg by mouth daily.         . meloxicam (MOBIC) 7.5 MG tablet   Oral   Take 7.5 mg by mouth daily as needed for pain.         . pantoprazole (PROTONIX) 40 MG tablet   Oral   Take 40 mg by mouth daily.         . Saxagliptin-Metformin (KOMBIGLYZE XR) 2.09-998 MG TB24   Oral   Take 1 tablet by mouth 2 (two) times daily.         . tamsulosin (FLOMAX) 0.4 MG CAPS   Oral   Take 0.4 mg by mouth daily after breakfast.         . ondansetron (ZOFRAN ODT) 4 MG disintegrating tablet   Oral   Take 1 tablet (4 mg total) by mouth every 8 (eight) hours as needed for  nausea or vomiting.   10 tablet   0    BP 130/69  Pulse 68  Temp(Src) 97.5 F (36.4 C) (Oral)  Resp 25  SpO2 96% Physical Exam  Constitutional: He is oriented to person, place, and time. He appears well-developed and well-nourished. No distress.  HENT:  Head: Normocephalic and atraumatic.  Right Ear: External ear normal.  Left Ear: External ear normal.  Nose: Nose normal.  Mouth/Throat: No oropharyngeal exudate.  Eyes: Conjunctivae are normal.  Neck: Neck supple.  Cardiovascular: Normal rate, regular rhythm and normal heart sounds.   Pulmonary/Chest: Effort normal and breath sounds normal.  Abdominal: Soft. Bowel sounds are normal. He exhibits no distension. There is tenderness (mildly) in the right lower quadrant, suprapubic area and left lower quadrant. There is no rigidity, no rebound and no guarding.  Neurological: He is alert and oriented to person, place, and time.  Skin: Skin is warm and dry. He is not diaphoretic.    ED Course  Procedures (including critical care time) Medications  morphine 4 MG/ML injection 4 mg (not administered)  iohexol (OMNIPAQUE) 300 MG/ML solution 25 mL (25 mLs Oral Contrast Given 06/17/13 0757)  sodium chloride 0.9 % bolus 1,000 mL (0 mLs Intravenous Stopped 06/17/13 0939)  ondansetron (ZOFRAN) injection 4 mg (4 mg Intravenous Given 06/17/13 0754)  iohexol (OMNIPAQUE) 300 MG/ML solution 80 mL (80 mLs Intravenous Contrast Given 06/17/13 0948)    Labs Review Labs Reviewed  COMPREHENSIVE METABOLIC PANEL - Abnormal; Notable for the following:    Glucose, Bld 219 (*)    Calcium 8.3 (*)    Albumin 3.1 (*)    GFR calc non Af Amer 85 (*)    All other components within normal limits  CBC WITH DIFFERENTIAL  URINALYSIS, ROUTINE W REFLEX MICROSCOPIC  LIPASE, BLOOD   Imaging Review Ct Abdomen Pelvis W Contrast  06/17/2013   CLINICAL DATA:  Abdominal pain.  EXAM: CT ABDOMEN AND PELVIS WITH CONTRAST  TECHNIQUE: Multidetector CT imaging of the abdomen  and pelvis was performed using the standard protocol following bolus administration of intravenous contrast.  CONTRAST:  4mL OMNIPAQUE IOHEXOL 300 MG/ML  SOLN  COMPARISON:  CT chest 04/07/2013.  FINDINGS: Liver normal. Hepatic veins and portal vein patent. Spleen normal. Splenic vein patent. Pancreas normal. No biliary distention or gallbladder distention.  Adrenals normal. Kidneys normal. No obstructing ureteral stone. Bladder is nondistended. No  free pelvic fluid. Prostate calcifications noted.  Aorta normal caliber. Visceral vessels are patent. No significant adenopathy.  Appendix normal. No inflammatory changes noted right or left lower quadrant. No bowel distention. No free air. Sliding hiatal hernia.  Pulmonary basilar interstitial fibrosis again noted. Cardiomegaly. Stable sub- carinal lymph nodes present. Tiny umbilical hernia with herniation of fat only. Small inguinal hernias with herniation of fat only. Degenerative changes lumbar spine.  IMPRESSION: 1. Tiny umbilical and bilateral inguinal hernias with herniation of fat only. No acute intra-abdominal abnormality. 2. Stable bibasilar pulmonary interstitial fibrosis and sub- carinal lymph nodes. Similar findings noted on prior chest CT of 04/07/2013 .   Electronically Signed   By: Marcello Moores  Register   On: 06/17/2013 10:27    EKG Interpretation    Date/Time:  Monday June 17 2013 07:07:25 EST Ventricular Rate:  69 PR Interval:  178 QRS Duration: 107 QT Interval:  390 QTC Calculation: 418 R Axis:   -111 Text Interpretation:  A-V dual-paced complexes w/ some inhibition No further analysis attempted due to paced rhythm Confirmed by ALLEN  MD, ANTHONY ([id number]) on 06/17/2013 7:11:42 AM            MDM   1. Abdominal pain in male   2. Interstitial pulmonary fibrosis     Filed Vitals:   06/17/13 1057  BP: 130/69  Pulse: 68  Temp: 97.5 F (36.4 C)  Resp: 25   Afebrile, NAD, non-toxic appearing, AAOx4.   Patient is nontoxic,  nonseptic appearing, in no apparent distress.  Patient's pain and other symptoms adequately managed in emergency department.  Fluid bolus given.  Labs, imaging and vitals reviewed.  Patient does not meet the SIRS or Sepsis criteria.  On repeat exam patient does not have a surgical abdomen and there are nor peritoneal signs.  No indication of appendicitis, bowel obstruction, bowel perforation, cholecystitis, diverticulitis.  Patient discharged home with symptomatic treatment and given strict instructions for follow-up with their primary care physician. 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 No acute changes with pulmonary complaints today.  I have also discussed reasons to return immediately to the ER.  Patient expresses understanding and agrees with plan. Patient d/w with Dr. Zenia Resides, agrees with plan.         Harlow Mares, PA-C 06/17/13 1404

## 2013-06-17 NOTE — Discharge Instructions (Signed)
Please follow up with your primary care physician in 1-2 days. If you do not have one please call the [facility] number listed above. Please call the [facility] to follow up with. Please use Zofran as needed for nausea. Please read all discharge instructions and return precautions.   Abdominal Pain, Adult Many things can cause abdominal pain. Usually, abdominal pain is not caused by a disease and will improve without treatment. It can often be observed and treated at home. Your health care provider will do a physical exam and possibly order blood tests and X-rays to help determine the seriousness of your pain. However, in many cases, more time must pass before a clear cause of the pain can be found. Before that point, your health care provider may not know if you need more testing or further treatment. HOME CARE INSTRUCTIONS  Monitor your abdominal pain for any changes. The following actions may help to alleviate any discomfort you are experiencing:  Only take over-the-counter or prescription medicines as directed by your health care provider.  Do not take laxatives unless directed to do so by your health care provider.  Try a clear liquid diet (broth, tea, or water) as directed by your health care provider. Slowly move to a bland diet as tolerated. SEEK MEDICAL CARE IF:  You have unexplained abdominal pain.  You have abdominal pain associated with nausea or diarrhea.  You have pain when you urinate or have a bowel movement.  You experience abdominal pain that wakes you in the night.  You have abdominal pain that is worsened or improved by eating food.  You have abdominal pain that is worsened with eating fatty foods. SEEK IMMEDIATE MEDICAL CARE IF:   Your pain does not go away within 2 hours.  You have a fever.  You keep throwing up (vomiting).  Your pain is felt only in portions of the abdomen, such as the right side or the left lower portion of  the abdomen.  You pass bloody or black tarry stools. MAKE SURE YOU:  Understand these instructions.   Will watch your condition.   Will get help right away if you are not doing well or get worse.  Document Released: 02/23/2005 Document Revised: 03/06/2013 Document Reviewed: 01/23/2013 [facility] Patient Information 2014 [place].  Idiopathic Pulmonary Fibrosis Idiopathic pulmonary fibrosis is an inflammation (soreness and irritation) in the lungs which eventually causes scarring. This usually shows in middle age over a several year time period. The cause is unknown (idiopathic). Usually death occurs after several years but there are no time tables which will predict perfectly what the course of the illness will be. It affects males and females equally. In this condition there is a formation of fibrous (tough leathery) tissue in the small ducts which carry air to and from your lungs. Because of this, the lungs do not work as well as they should for taking in oxygen from the air you breathe and getting rid of wastes (carbon dioxide). Because the lungs are damaged, there may be more problems with infections or the heart.  Other problems may include pulmonary hypertension (high blood pressure in the lungs), and formation of blood clots. Some symptoms of this illness are:  Coughing and breathing difficulties; cough is usually dry and hacking.  Bluish (cyanotic) skin and lips due to lack of circulating oxygen.  Loss of appetite.  Loss of strength which comes from just the increased work of breathing.  Rapid, shallow breathing  occur with moderate exercise and later even while resting.  Increasing shortness of breath (dyspnea) which progresses as the disease gets worse.  Weight loss and fatigue due partly to the increased work of breathing.  Clubbing of the fingers (the ends of the fingers become rounded and enlarged). DIAGNOSIS  A diagnosis of idiopathic pulmonary fibrosis may be  suspected based on exam and a patient's history.  Specialized X-rays, pulmonary function tests, pulse oximetry, and laboratory tests including blood gasses help confirm the problem.  Sometimes a biopsy is done and a small piece of lung tissue is removed. This may be done through a bronchoscope or during an operation in which the chest is opened. This is looked at under a microscope by a specialist who can tell what the lung problem is. CAUSES If the cause of pulmonary fibrosis is known, it is no longer known as idiopathic. Several causes of pulmonary fibrosis include:  Occupational and environmental exposures to asbestos, silica, and or metal dusts.  Illegal or street drug use.  Agricultural workers may inhale substances, such as moldy hay, which can cause an allergic reaction in the lung. This reaction is called Farmer's Lung and can cause pulmonary fibrosis. Some other fumes found on farms are directly toxic to the lungs.  Exposure of the lungs to radiation.  Collagen diseases.  Sarcoidosis is a disease which forms granulomas (areas of inflammatory cells), which can attack any area of the body but most frequently affects the lungs.  Drugs. Certain medicines may have the undesirable side effect of causing pulmonary fibrosis. Check with your doctor about the medicines you are taking and ask about any possible side effects.  Some cases of pulmonary fibrosis seem to be genetic. TREATMENT   There are no drugs currently approved for the treatment of pulmonary fibrosis. Steroids (a potent medication which cuts down on inflammation) are sometimes given to prevent lung changes before they become permanent. High doses may be recommended at first, followed by lower maintenance dosages. Other medications may be tried if steroids do not work.  Lung disease may be monitored with X-rays and laboratory work.  Oxygen may be helpful if oxygen in the blood is diminished. This improves the quality of  life. Your caregiver will give you a prescription for this if it is helpful.  Antibiotics are used for treatment of infections.  Exercise may be beneficial.  Lung transplants are being investigated and a single lung transplant may be considered for some patients.  Influenza vaccine and pneumococcal pneumonia vaccine are both recommended for people with IPF or any lung disease. These two shots may help keep you healthy. Document Released: 08/06/2003 Document Revised: 08/08/2011 Document Reviewed: 05/16/2005 [facility] Patient Information 2014 [place], [place].

## 2013-06-17 NOTE — ED Provider Notes (Signed)
Medical screening examination/treatment/procedure(s) were conducted as a shared visit with non-physician practitioner(s) and myself.  I personally evaluated the patient during the encounter.  EKG Interpretation    Date/Time:  Monday June 17 2013 07:07:25 EST Ventricular Rate:  69 PR Interval:  178 QRS Duration: 107 QT Interval:  390 QTC Calculation: 418 R Axis:   -111 Text Interpretation:  A-V dual-paced complexes w/ some inhibition No further analysis attempted due to paced rhythm Confirmed by Zenia Resides  MD, Rayshad Riviello ([id number]) on 06/17/2013 7:11:42 AM             Leota Jacobsen, MD 06/17/13 [phone]

## 2013-06-17 NOTE — ED Provider Notes (Signed)
Medical screening examination/treatment/procedure(s) were conducted as a shared visit with non-physician practitioner(s) and myself.  I personally evaluated the patient during the encounter.  EKG Interpretation    Date/Time:  Monday June 17 2013 07:07:25 EST Ventricular Rate:  69 PR Interval:  178 QRS Duration: 107 QT Interval:  390 QTC Calculation: 418 R Axis:   -111 Text Interpretation:  A-V dual-paced complexes w/ some inhibition No further analysis attempted due to paced rhythm Confirmed by Adelie Croswell  MD, Gould ([id number]) on 06/17/2013 7:11:42 AM           Patient seen examined. Abdominal exam is benign at this point. Abdominal CT shows no acute surgical process. Patient does have long-standing pulmonary issues x2 and half months. He is not acutely hypoxic at this time. He'll be given referral to pulmonary  Leota Jacobsen, MD 06/17/13 1042

## 2013-06-17 NOTE — ED Notes (Signed)
Patient transported to CT 

## 2013-06-17 NOTE — ED Notes (Signed)
Pt here from home with c/o burning stomach pains which have eased off at this time , no n/v/d

## 2013-06-17 NOTE — ED Notes (Signed)
Pt doesn't want the morphine at this time

## 2013-06-17 NOTE — ED Notes (Signed)
Returned from CT scan.

## 2013-06-25 ENCOUNTER — Ambulatory Visit: Payer: Self-pay | Admitting: Internal Medicine

## 2013-06-29 LAB — BRONCHIAL WASH CULTURE

## 2013-07-16 LAB — CULTURE, FUNGUS WITHOUT SMEAR

## 2013-07-17 ENCOUNTER — Ambulatory Visit: Payer: Medicare Other | Admitting: Adult Health

## 2013-07-30 ENCOUNTER — Other Ambulatory Visit: Payer: Self-pay | Admitting: Physician Assistant

## 2013-07-30 NOTE — Telephone Encounter (Signed)
Refill appropriate and filled per protocol. 

## 2013-09-12 ENCOUNTER — Ambulatory Visit: Payer: Self-pay | Admitting: Internal Medicine

## 2013-10-29 ENCOUNTER — Encounter: Payer: Self-pay | Admitting: Internal Medicine

## 2013-11-27 ENCOUNTER — Encounter: Payer: Self-pay | Admitting: Internal Medicine

## 2014-02-11 ENCOUNTER — Ambulatory Visit: Payer: Self-pay | Admitting: Gastroenterology

## 2014-02-28 IMAGING — RF DG UGI W/ HIGH DENSITY W/KUB
15 of 19 series · 18 of 24 positions shown · non-contrast
Comparison: None.

FLUOROSCOPY TIME:  2 min 0 seconds

CLINICAL DATA: GERD

EXAM:
UPPER GI SERIES W/HIGH DENSITY W/KUB
TECHNIQUE: After obtaining a scout radiograph a routine upper GI series was
performed using thin and high density barium.

[Series 1: run · 4 of 18 slices shown (1 of 14)]
[im 1/18]
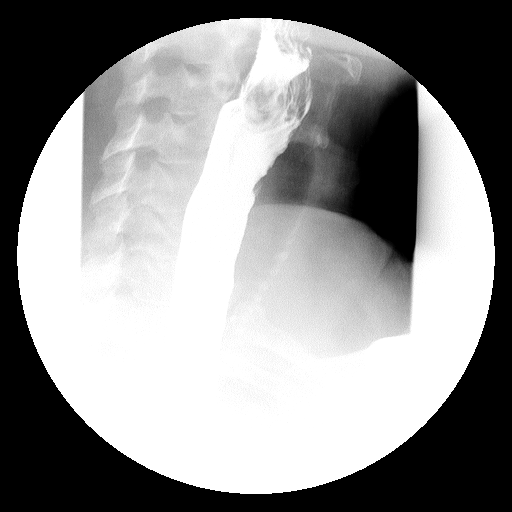
[im 7/18]
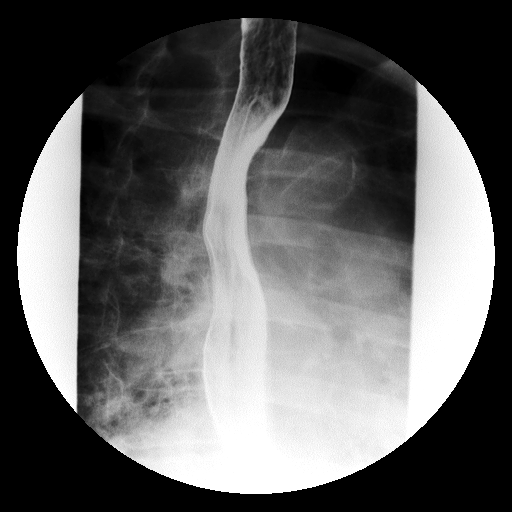
[im 11/18]
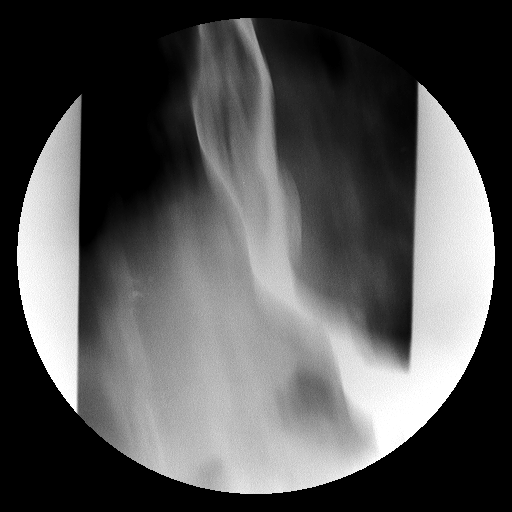
[im 14/18]
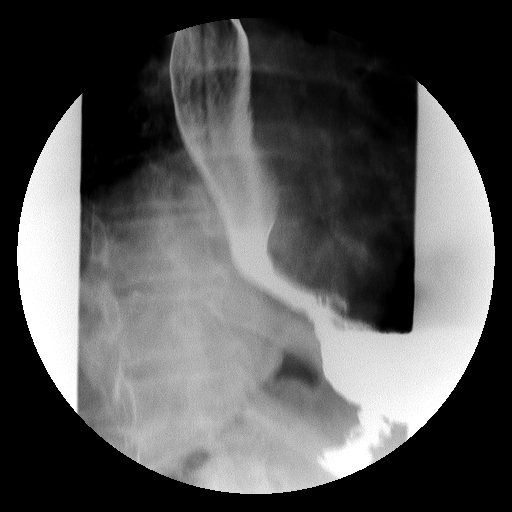

[Series 2: run · 1 of 1 slices shown (2 of 14)]
[im 1/1]
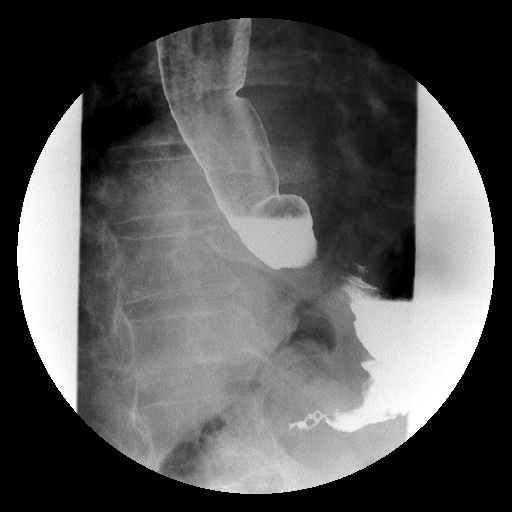

[Series 3: run · 1 of 1 slices shown (3 of 14)]
[im 1/1]
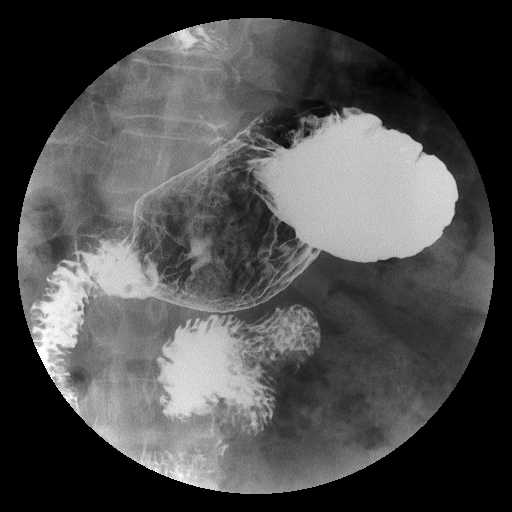

[Series 4: run · 1 of 1 slices shown (4 of 14)]
[im 1/1]
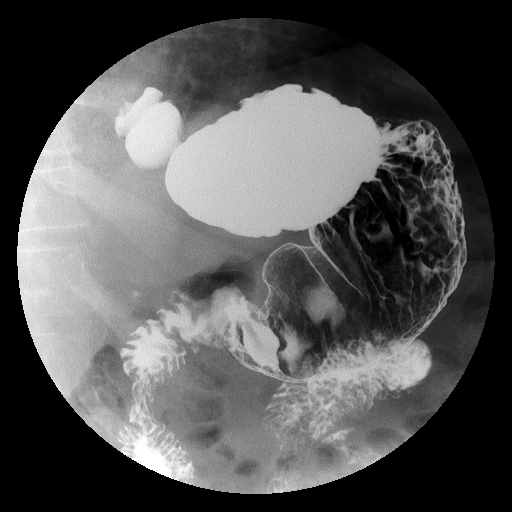

[Series 6: run · 1 of 1 slices shown (5 of 14)]
[im 1/1]
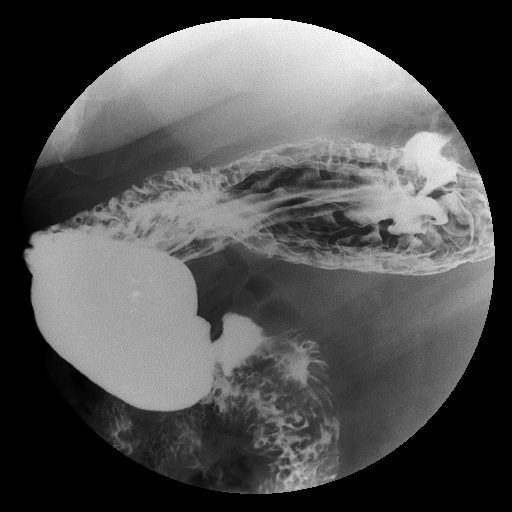

[Series 7: run · 1 of 1 slices shown (6 of 14)]
[im 1/1]
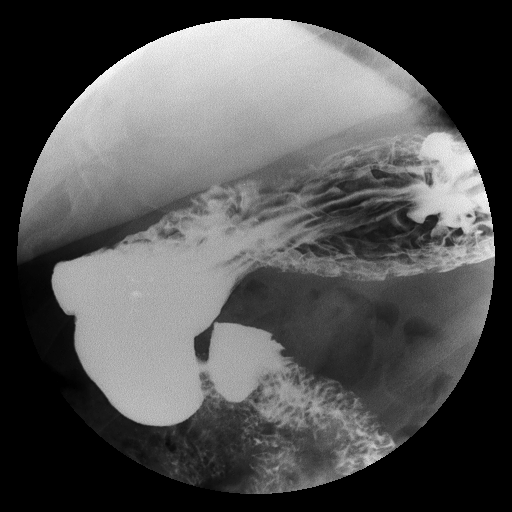

[Series 8: run · 1 of 1 slices shown (7 of 14)]
[im 1/1]
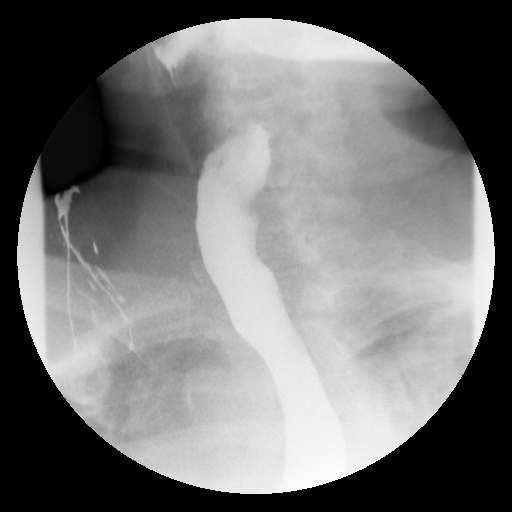

[Series 10: run · 1 of 1 slices shown (8 of 14)]
[im 1/1]
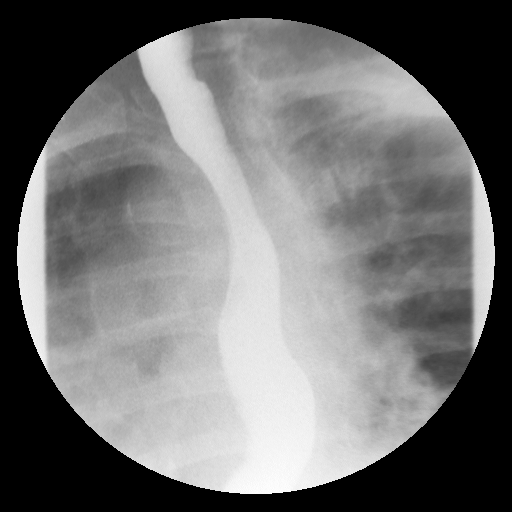

[Series 11: run · 1 of 1 slices shown (9 of 14)]
[im 1/1]
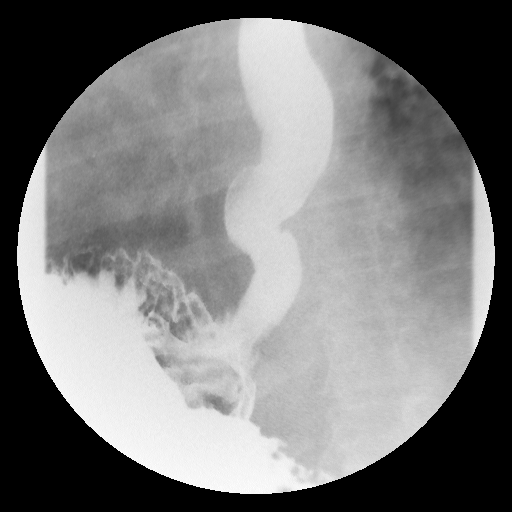

[Series 12: run · 1 of 1 slices shown (10 of 14)]
[im 1/1]
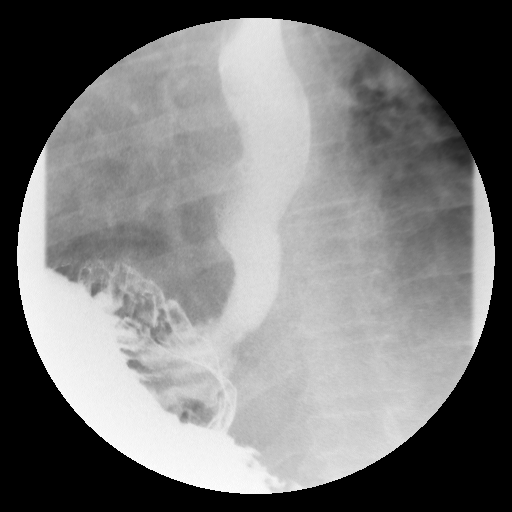

[Series 14: run · 1 of 1 slices shown (11 of 14)]
[im 1/1]
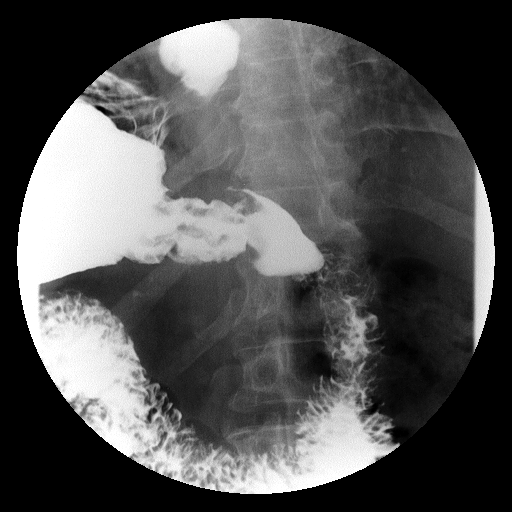

[Series 15: run · 1 of 1 slices shown (12 of 14)]
[im 1/1]
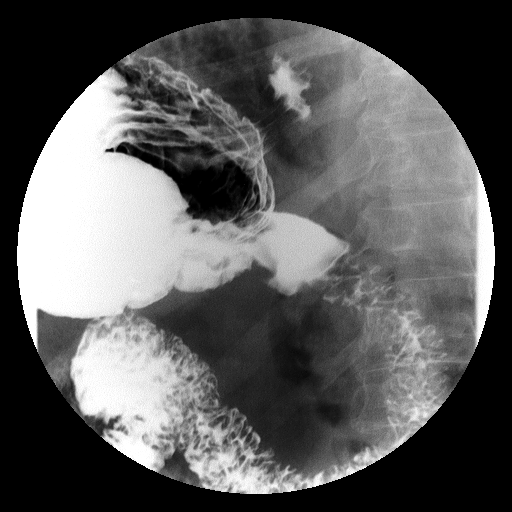

[Series 16: run · 1 of 1 slices shown (13 of 14)]
[im 1/1]
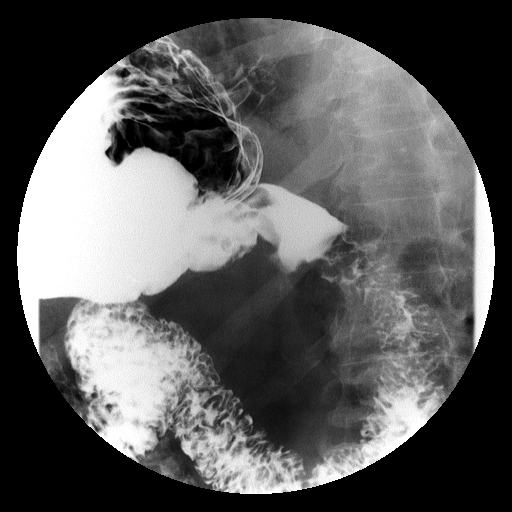

[Series 18: run · 1 of 1 slices shown (14 of 14)]
[im 1/1]
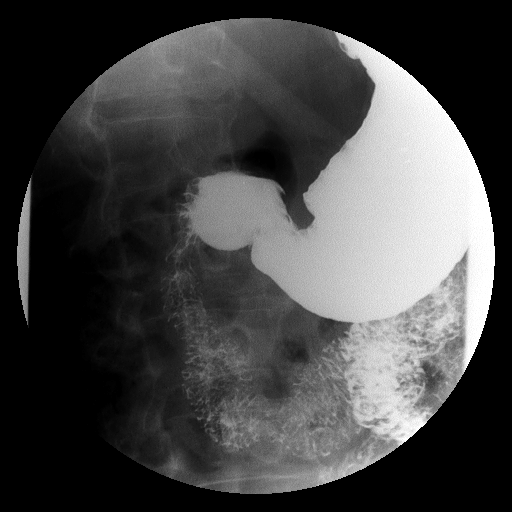

[Series 1001: view not recorded · 0.20mm/px · 1 of 1 slices shown]
[im 1/1]
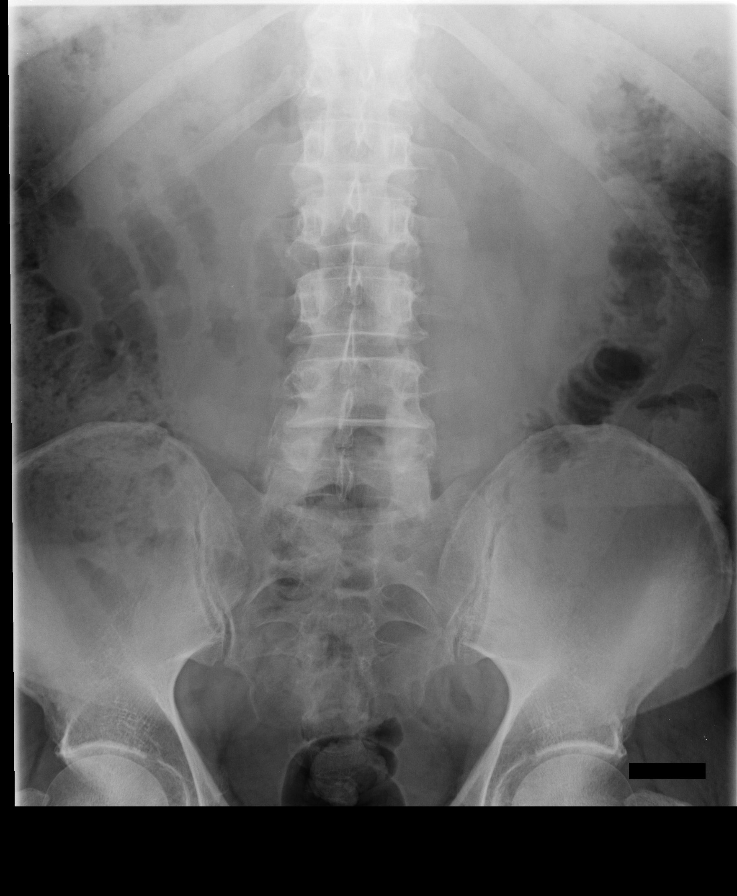

[18 of 24 positions shown; findings below may reference images not displayed]

FINDINGS: Esophageal mucosa and motility are within normal limits. Small
hiatal hernia with moderate gastroesophageal reflux to the
midesophagus level. No stricture. Barium tablet passed readily into
the stomach.

Stomach and duodenum bulb are normal. No ulcer mass or mucosal
edema.

Bowel gas pattern is normal.
IMPRESSION: Gastroesophageal reflux without esophageal stricture.

## 2014-03-14 ENCOUNTER — Telehealth: Payer: Self-pay | Admitting: Family Medicine

## 2014-03-14 NOTE — Telephone Encounter (Signed)
Per request from pharmacy "Please send new prescription with formulary medicine.  Advodart changed to generic and insurance won't cover it."

## 2014-03-17 NOTE — Telephone Encounter (Signed)
He is already on Flomax in addition to his Avodart. Will simply have to stop the Avodart and tell patient that his insurance is not covering this any further. Would just continue the Flomax.

## 2014-03-17 NOTE — Telephone Encounter (Signed)
Pt notified to stop Advodart and just continue with Flomax.

## 2014-04-06 ENCOUNTER — Other Ambulatory Visit: Payer: Self-pay | Admitting: Physician Assistant

## 2014-04-12 ENCOUNTER — Other Ambulatory Visit: Payer: Self-pay | Admitting: Physician Assistant

## 2014-04-14 ENCOUNTER — Encounter: Payer: Self-pay | Admitting: Family Medicine

## 2014-04-14 NOTE — Telephone Encounter (Signed)
Medication refill for one time only.  Patient needs to be seen.  Letter sent for patient to call and schedule 

## 2014-05-05 ENCOUNTER — Other Ambulatory Visit: Payer: Self-pay | Admitting: Physician Assistant

## 2014-05-05 NOTE — Telephone Encounter (Signed)
Medication filled x1 with no refills.   Requires office visit before any further refills can be given.   Letter sent.  

## 2014-05-30 DIAGNOSIS — Z942 Lung transplant status: Secondary | ICD-10-CM

## 2014-05-30 HISTORY — DX: Lung transplant status: Z94.2

## 2014-07-09 DIAGNOSIS — Z9281 Personal history of extracorporeal membrane oxygenation (ECMO): Secondary | ICD-10-CM | POA: Insufficient documentation

## 2014-07-09 DIAGNOSIS — J449 Chronic obstructive pulmonary disease, unspecified: Secondary | ICD-10-CM | POA: Insufficient documentation

## 2014-07-30 DIAGNOSIS — D849 Immunodeficiency, unspecified: Secondary | ICD-10-CM | POA: Insufficient documentation

## 2014-07-30 DIAGNOSIS — N401 Enlarged prostate with lower urinary tract symptoms: Secondary | ICD-10-CM | POA: Insufficient documentation

## 2015-01-02 DIAGNOSIS — E661 Drug-induced obesity: Secondary | ICD-10-CM | POA: Insufficient documentation

## 2015-09-07 DIAGNOSIS — Z85828 Personal history of other malignant neoplasm of skin: Secondary | ICD-10-CM | POA: Insufficient documentation

## 2016-06-09 DIAGNOSIS — D225 Melanocytic nevi of trunk: Secondary | ICD-10-CM | POA: Diagnosis not present

## 2016-06-09 DIAGNOSIS — L814 Other melanin hyperpigmentation: Secondary | ICD-10-CM | POA: Diagnosis not present

## 2016-06-09 DIAGNOSIS — D2270 Melanocytic nevi of unspecified lower limb, including hip: Secondary | ICD-10-CM | POA: Diagnosis not present

## 2016-06-09 DIAGNOSIS — S90222A Contusion of left lesser toe(s) with damage to nail, initial encounter: Secondary | ICD-10-CM | POA: Diagnosis not present

## 2016-06-09 DIAGNOSIS — Z85828 Personal history of other malignant neoplasm of skin: Secondary | ICD-10-CM | POA: Diagnosis not present

## 2016-06-09 DIAGNOSIS — D2260 Melanocytic nevi of unspecified upper limb, including shoulder: Secondary | ICD-10-CM | POA: Diagnosis not present

## 2016-06-09 DIAGNOSIS — L570 Actinic keratosis: Secondary | ICD-10-CM | POA: Diagnosis not present

## 2016-06-20 DIAGNOSIS — Z4824 Encounter for aftercare following lung transplant: Secondary | ICD-10-CM | POA: Diagnosis not present

## 2016-06-20 DIAGNOSIS — J309 Allergic rhinitis, unspecified: Secondary | ICD-10-CM | POA: Diagnosis not present

## 2016-06-20 DIAGNOSIS — K228 Other specified diseases of esophagus: Secondary | ICD-10-CM | POA: Diagnosis not present

## 2016-06-20 DIAGNOSIS — J410 Simple chronic bronchitis: Secondary | ICD-10-CM | POA: Diagnosis not present

## 2016-06-20 DIAGNOSIS — Z942 Lung transplant status: Secondary | ICD-10-CM | POA: Diagnosis not present

## 2016-06-20 DIAGNOSIS — I10 Essential (primary) hypertension: Secondary | ICD-10-CM | POA: Diagnosis not present

## 2016-06-20 DIAGNOSIS — R413 Other amnesia: Secondary | ICD-10-CM | POA: Diagnosis not present

## 2016-06-20 DIAGNOSIS — E785 Hyperlipidemia, unspecified: Secondary | ICD-10-CM | POA: Diagnosis not present

## 2016-06-20 DIAGNOSIS — G4733 Obstructive sleep apnea (adult) (pediatric): Secondary | ICD-10-CM | POA: Diagnosis not present

## 2016-06-20 DIAGNOSIS — N400 Enlarged prostate without lower urinary tract symptoms: Secondary | ICD-10-CM | POA: Diagnosis not present

## 2016-06-20 DIAGNOSIS — J849 Interstitial pulmonary disease, unspecified: Secondary | ICD-10-CM | POA: Diagnosis not present

## 2016-06-20 DIAGNOSIS — E669 Obesity, unspecified: Secondary | ICD-10-CM | POA: Diagnosis not present

## 2016-06-20 DIAGNOSIS — Z9889 Other specified postprocedural states: Secondary | ICD-10-CM | POA: Diagnosis not present

## 2016-06-20 DIAGNOSIS — E1122 Type 2 diabetes mellitus with diabetic chronic kidney disease: Secondary | ICD-10-CM | POA: Diagnosis not present

## 2016-06-20 DIAGNOSIS — I129 Hypertensive chronic kidney disease with stage 1 through stage 4 chronic kidney disease, or unspecified chronic kidney disease: Secondary | ICD-10-CM | POA: Diagnosis not present

## 2016-06-20 DIAGNOSIS — D899 Disorder involving the immune mechanism, unspecified: Secondary | ICD-10-CM | POA: Diagnosis not present

## 2016-06-20 DIAGNOSIS — N183 Chronic kidney disease, stage 3 (moderate): Secondary | ICD-10-CM | POA: Diagnosis not present

## 2016-06-20 DIAGNOSIS — T86819 Unspecified complication of lung transplant: Secondary | ICD-10-CM | POA: Diagnosis not present

## 2016-07-07 DIAGNOSIS — E1129 Type 2 diabetes mellitus with other diabetic kidney complication: Secondary | ICD-10-CM | POA: Diagnosis not present

## 2016-07-07 DIAGNOSIS — Z794 Long term (current) use of insulin: Secondary | ICD-10-CM | POA: Diagnosis not present

## 2016-07-07 DIAGNOSIS — E119 Type 2 diabetes mellitus without complications: Secondary | ICD-10-CM | POA: Diagnosis not present

## 2016-07-11 DIAGNOSIS — Z942 Lung transplant status: Secondary | ICD-10-CM | POA: Diagnosis not present

## 2016-07-11 DIAGNOSIS — J8417 Other interstitial pulmonary diseases with fibrosis in diseases classified elsewhere: Secondary | ICD-10-CM | POA: Diagnosis not present

## 2016-07-11 DIAGNOSIS — E6609 Other obesity due to excess calories: Secondary | ICD-10-CM | POA: Diagnosis not present

## 2016-07-25 DIAGNOSIS — Z942 Lung transplant status: Secondary | ICD-10-CM | POA: Diagnosis not present

## 2016-07-25 DIAGNOSIS — T86819 Unspecified complication of lung transplant: Secondary | ICD-10-CM | POA: Diagnosis not present

## 2016-08-01 DIAGNOSIS — N183 Chronic kidney disease, stage 3 (moderate): Secondary | ICD-10-CM | POA: Diagnosis not present

## 2016-08-01 DIAGNOSIS — Z942 Lung transplant status: Secondary | ICD-10-CM | POA: Diagnosis not present

## 2016-08-01 DIAGNOSIS — Z79899 Other long term (current) drug therapy: Secondary | ICD-10-CM | POA: Diagnosis not present

## 2016-08-01 DIAGNOSIS — B029 Zoster without complications: Secondary | ICD-10-CM | POA: Diagnosis not present

## 2016-08-01 DIAGNOSIS — D899 Disorder involving the immune mechanism, unspecified: Secondary | ICD-10-CM | POA: Diagnosis not present

## 2016-08-01 DIAGNOSIS — Z5181 Encounter for therapeutic drug level monitoring: Secondary | ICD-10-CM | POA: Diagnosis not present

## 2016-08-17 DIAGNOSIS — R0602 Shortness of breath: Secondary | ICD-10-CM | POA: Diagnosis not present

## 2016-08-29 DIAGNOSIS — Z9281 Personal history of extracorporeal membrane oxygenation (ECMO): Secondary | ICD-10-CM | POA: Diagnosis not present

## 2016-08-29 DIAGNOSIS — D899 Disorder involving the immune mechanism, unspecified: Secondary | ICD-10-CM | POA: Diagnosis not present

## 2016-08-29 DIAGNOSIS — N183 Chronic kidney disease, stage 3 (moderate): Secondary | ICD-10-CM | POA: Diagnosis not present

## 2016-08-29 DIAGNOSIS — E1122 Type 2 diabetes mellitus with diabetic chronic kidney disease: Secondary | ICD-10-CM | POA: Diagnosis not present

## 2016-08-29 DIAGNOSIS — K228 Other specified diseases of esophagus: Secondary | ICD-10-CM | POA: Diagnosis not present

## 2016-08-29 DIAGNOSIS — Z7984 Long term (current) use of oral hypoglycemic drugs: Secondary | ICD-10-CM | POA: Diagnosis not present

## 2016-08-29 DIAGNOSIS — Z79899 Other long term (current) drug therapy: Secondary | ICD-10-CM | POA: Diagnosis not present

## 2016-08-29 DIAGNOSIS — Z7952 Long term (current) use of systemic steroids: Secondary | ICD-10-CM | POA: Diagnosis not present

## 2016-08-29 DIAGNOSIS — B0229 Other postherpetic nervous system involvement: Secondary | ICD-10-CM | POA: Diagnosis not present

## 2016-08-29 DIAGNOSIS — J398 Other specified diseases of upper respiratory tract: Secondary | ICD-10-CM | POA: Diagnosis not present

## 2016-08-29 DIAGNOSIS — T86819 Unspecified complication of lung transplant: Secondary | ICD-10-CM | POA: Diagnosis not present

## 2016-08-29 DIAGNOSIS — T86812 Lung transplant infection: Secondary | ICD-10-CM | POA: Diagnosis not present

## 2016-08-29 DIAGNOSIS — G4733 Obstructive sleep apnea (adult) (pediatric): Secondary | ICD-10-CM | POA: Diagnosis not present

## 2016-08-29 DIAGNOSIS — Z6832 Body mass index (BMI) 32.0-32.9, adult: Secondary | ICD-10-CM | POA: Diagnosis not present

## 2016-08-29 DIAGNOSIS — I129 Hypertensive chronic kidney disease with stage 1 through stage 4 chronic kidney disease, or unspecified chronic kidney disease: Secondary | ICD-10-CM | POA: Diagnosis not present

## 2016-08-29 DIAGNOSIS — J410 Simple chronic bronchitis: Secondary | ICD-10-CM | POA: Diagnosis not present

## 2016-08-29 DIAGNOSIS — Z942 Lung transplant status: Secondary | ICD-10-CM | POA: Diagnosis not present

## 2016-08-29 DIAGNOSIS — Z9889 Other specified postprocedural states: Secondary | ICD-10-CM | POA: Diagnosis not present

## 2016-08-29 DIAGNOSIS — N400 Enlarged prostate without lower urinary tract symptoms: Secondary | ICD-10-CM | POA: Diagnosis not present

## 2016-08-29 DIAGNOSIS — E7800 Pure hypercholesterolemia, unspecified: Secondary | ICD-10-CM | POA: Diagnosis not present

## 2016-08-29 DIAGNOSIS — J84112 Idiopathic pulmonary fibrosis: Secondary | ICD-10-CM | POA: Diagnosis not present

## 2016-08-29 DIAGNOSIS — E669 Obesity, unspecified: Secondary | ICD-10-CM | POA: Diagnosis not present

## 2016-08-29 DIAGNOSIS — E661 Drug-induced obesity: Secondary | ICD-10-CM | POA: Diagnosis not present

## 2016-08-29 DIAGNOSIS — Z792 Long term (current) use of antibiotics: Secondary | ICD-10-CM | POA: Diagnosis not present

## 2016-08-29 DIAGNOSIS — J309 Allergic rhinitis, unspecified: Secondary | ICD-10-CM | POA: Diagnosis not present

## 2016-08-29 DIAGNOSIS — Z9989 Dependence on other enabling machines and devices: Secondary | ICD-10-CM | POA: Diagnosis not present

## 2016-08-29 DIAGNOSIS — Z4824 Encounter for aftercare following lung transplant: Secondary | ICD-10-CM | POA: Diagnosis not present

## 2016-08-29 DIAGNOSIS — Z7982 Long term (current) use of aspirin: Secondary | ICD-10-CM | POA: Diagnosis not present

## 2016-08-29 DIAGNOSIS — J9809 Other diseases of bronchus, not elsewhere classified: Secondary | ICD-10-CM | POA: Diagnosis not present

## 2016-08-29 DIAGNOSIS — E785 Hyperlipidemia, unspecified: Secondary | ICD-10-CM | POA: Diagnosis not present

## 2016-08-29 DIAGNOSIS — R0989 Other specified symptoms and signs involving the circulatory and respiratory systems: Secondary | ICD-10-CM | POA: Diagnosis not present

## 2016-08-29 DIAGNOSIS — J849 Interstitial pulmonary disease, unspecified: Secondary | ICD-10-CM | POA: Diagnosis not present

## 2016-09-07 DIAGNOSIS — R413 Other amnesia: Secondary | ICD-10-CM | POA: Diagnosis not present

## 2016-09-08 DIAGNOSIS — D899 Disorder involving the immune mechanism, unspecified: Secondary | ICD-10-CM | POA: Diagnosis not present

## 2016-09-08 DIAGNOSIS — Z8619 Personal history of other infectious and parasitic diseases: Secondary | ICD-10-CM | POA: Diagnosis not present

## 2016-09-08 DIAGNOSIS — B0229 Other postherpetic nervous system involvement: Secondary | ICD-10-CM | POA: Diagnosis not present

## 2016-09-08 DIAGNOSIS — D1801 Hemangioma of skin and subcutaneous tissue: Secondary | ICD-10-CM | POA: Diagnosis not present

## 2016-09-08 DIAGNOSIS — L821 Other seborrheic keratosis: Secondary | ICD-10-CM | POA: Diagnosis not present

## 2016-09-08 DIAGNOSIS — L820 Inflamed seborrheic keratosis: Secondary | ICD-10-CM | POA: Diagnosis not present

## 2016-09-08 DIAGNOSIS — L814 Other melanin hyperpigmentation: Secondary | ICD-10-CM | POA: Diagnosis not present

## 2016-09-08 DIAGNOSIS — D2260 Melanocytic nevi of unspecified upper limb, including shoulder: Secondary | ICD-10-CM | POA: Diagnosis not present

## 2016-09-08 DIAGNOSIS — D2270 Melanocytic nevi of unspecified lower limb, including hip: Secondary | ICD-10-CM | POA: Diagnosis not present

## 2016-09-08 DIAGNOSIS — Z85828 Personal history of other malignant neoplasm of skin: Secondary | ICD-10-CM | POA: Diagnosis not present

## 2016-09-08 DIAGNOSIS — D225 Melanocytic nevi of trunk: Secondary | ICD-10-CM | POA: Diagnosis not present

## 2016-09-13 DIAGNOSIS — Z942 Lung transplant status: Secondary | ICD-10-CM | POA: Diagnosis not present

## 2016-09-13 DIAGNOSIS — T86818 Other complications of lung transplant: Secondary | ICD-10-CM | POA: Diagnosis not present

## 2016-10-10 DIAGNOSIS — T86818 Other complications of lung transplant: Secondary | ICD-10-CM | POA: Diagnosis not present

## 2016-10-10 DIAGNOSIS — Z942 Lung transplant status: Secondary | ICD-10-CM | POA: Diagnosis not present

## 2016-11-04 DIAGNOSIS — Z79899 Other long term (current) drug therapy: Secondary | ICD-10-CM | POA: Diagnosis not present

## 2016-11-04 DIAGNOSIS — E1165 Type 2 diabetes mellitus with hyperglycemia: Secondary | ICD-10-CM | POA: Diagnosis not present

## 2016-11-04 DIAGNOSIS — Z942 Lung transplant status: Secondary | ICD-10-CM | POA: Diagnosis not present

## 2016-11-04 DIAGNOSIS — Z794 Long term (current) use of insulin: Secondary | ICD-10-CM | POA: Diagnosis not present

## 2016-11-08 DIAGNOSIS — T86818 Other complications of lung transplant: Secondary | ICD-10-CM | POA: Diagnosis not present

## 2016-11-08 DIAGNOSIS — Z942 Lung transplant status: Secondary | ICD-10-CM | POA: Diagnosis not present

## 2016-11-17 DIAGNOSIS — T86818 Other complications of lung transplant: Secondary | ICD-10-CM | POA: Diagnosis not present

## 2016-12-15 DIAGNOSIS — Z942 Lung transplant status: Secondary | ICD-10-CM | POA: Diagnosis not present

## 2016-12-15 DIAGNOSIS — T86818 Other complications of lung transplant: Secondary | ICD-10-CM | POA: Diagnosis not present

## 2016-12-20 DIAGNOSIS — T86818 Other complications of lung transplant: Secondary | ICD-10-CM | POA: Diagnosis not present

## 2017-01-09 DIAGNOSIS — T86819 Unspecified complication of lung transplant: Secondary | ICD-10-CM | POA: Diagnosis not present

## 2017-01-09 DIAGNOSIS — K219 Gastro-esophageal reflux disease without esophagitis: Secondary | ICD-10-CM | POA: Diagnosis not present

## 2017-01-09 DIAGNOSIS — Z6834 Body mass index (BMI) 34.0-34.9, adult: Secondary | ICD-10-CM | POA: Diagnosis not present

## 2017-01-09 DIAGNOSIS — J849 Interstitial pulmonary disease, unspecified: Secondary | ICD-10-CM | POA: Diagnosis not present

## 2017-01-09 DIAGNOSIS — J309 Allergic rhinitis, unspecified: Secondary | ICD-10-CM | POA: Diagnosis not present

## 2017-01-09 DIAGNOSIS — Z4824 Encounter for aftercare following lung transplant: Secondary | ICD-10-CM | POA: Diagnosis not present

## 2017-01-09 DIAGNOSIS — N183 Chronic kidney disease, stage 3 (moderate): Secondary | ICD-10-CM | POA: Diagnosis not present

## 2017-01-09 DIAGNOSIS — E7800 Pure hypercholesterolemia, unspecified: Secondary | ICD-10-CM | POA: Diagnosis not present

## 2017-01-09 DIAGNOSIS — E785 Hyperlipidemia, unspecified: Secondary | ICD-10-CM | POA: Diagnosis not present

## 2017-01-09 DIAGNOSIS — E1122 Type 2 diabetes mellitus with diabetic chronic kidney disease: Secondary | ICD-10-CM | POA: Diagnosis not present

## 2017-01-09 DIAGNOSIS — Z9281 Personal history of extracorporeal membrane oxygenation (ECMO): Secondary | ICD-10-CM | POA: Diagnosis not present

## 2017-01-09 DIAGNOSIS — E669 Obesity, unspecified: Secondary | ICD-10-CM | POA: Diagnosis not present

## 2017-01-09 DIAGNOSIS — Z9989 Dependence on other enabling machines and devices: Secondary | ICD-10-CM | POA: Diagnosis not present

## 2017-01-09 DIAGNOSIS — B009 Herpesviral infection, unspecified: Secondary | ICD-10-CM | POA: Diagnosis not present

## 2017-01-09 DIAGNOSIS — N400 Enlarged prostate without lower urinary tract symptoms: Secondary | ICD-10-CM | POA: Diagnosis not present

## 2017-01-09 DIAGNOSIS — D899 Disorder involving the immune mechanism, unspecified: Secondary | ICD-10-CM | POA: Diagnosis not present

## 2017-01-09 DIAGNOSIS — I129 Hypertensive chronic kidney disease with stage 1 through stage 4 chronic kidney disease, or unspecified chronic kidney disease: Secondary | ICD-10-CM | POA: Diagnosis not present

## 2017-01-09 DIAGNOSIS — G4733 Obstructive sleep apnea (adult) (pediatric): Secondary | ICD-10-CM | POA: Diagnosis not present

## 2017-01-09 DIAGNOSIS — I10 Essential (primary) hypertension: Secondary | ICD-10-CM | POA: Diagnosis not present

## 2017-01-09 DIAGNOSIS — Z942 Lung transplant status: Secondary | ICD-10-CM | POA: Diagnosis not present

## 2017-02-06 DIAGNOSIS — T86818 Other complications of lung transplant: Secondary | ICD-10-CM | POA: Diagnosis not present

## 2017-02-15 DIAGNOSIS — T86818 Other complications of lung transplant: Secondary | ICD-10-CM | POA: Diagnosis not present

## 2017-02-21 DIAGNOSIS — T86818 Other complications of lung transplant: Secondary | ICD-10-CM | POA: Diagnosis not present

## 2017-03-22 DIAGNOSIS — T86818 Other complications of lung transplant: Secondary | ICD-10-CM | POA: Diagnosis not present

## 2017-03-28 DIAGNOSIS — L820 Inflamed seborrheic keratosis: Secondary | ICD-10-CM | POA: Diagnosis not present

## 2017-03-28 DIAGNOSIS — D225 Melanocytic nevi of trunk: Secondary | ICD-10-CM | POA: Diagnosis not present

## 2017-03-28 DIAGNOSIS — L814 Other melanin hyperpigmentation: Secondary | ICD-10-CM | POA: Diagnosis not present

## 2017-03-28 DIAGNOSIS — D899 Disorder involving the immune mechanism, unspecified: Secondary | ICD-10-CM | POA: Diagnosis not present

## 2017-03-28 DIAGNOSIS — D1801 Hemangioma of skin and subcutaneous tissue: Secondary | ICD-10-CM | POA: Diagnosis not present

## 2017-03-28 DIAGNOSIS — L821 Other seborrheic keratosis: Secondary | ICD-10-CM | POA: Diagnosis not present

## 2017-03-28 DIAGNOSIS — D485 Neoplasm of uncertain behavior of skin: Secondary | ICD-10-CM | POA: Diagnosis not present

## 2017-03-28 DIAGNOSIS — Z85828 Personal history of other malignant neoplasm of skin: Secondary | ICD-10-CM | POA: Diagnosis not present

## 2017-03-28 DIAGNOSIS — D2270 Melanocytic nevi of unspecified lower limb, including hip: Secondary | ICD-10-CM | POA: Diagnosis not present

## 2017-03-28 DIAGNOSIS — L570 Actinic keratosis: Secondary | ICD-10-CM | POA: Diagnosis not present

## 2017-03-28 DIAGNOSIS — D2260 Melanocytic nevi of unspecified upper limb, including shoulder: Secondary | ICD-10-CM | POA: Diagnosis not present

## 2017-03-28 DIAGNOSIS — C4492 Squamous cell carcinoma of skin, unspecified: Secondary | ICD-10-CM | POA: Diagnosis not present

## 2017-04-10 DIAGNOSIS — D899 Disorder involving the immune mechanism, unspecified: Secondary | ICD-10-CM | POA: Diagnosis not present

## 2017-04-10 DIAGNOSIS — Z5181 Encounter for therapeutic drug level monitoring: Secondary | ICD-10-CM | POA: Diagnosis not present

## 2017-04-10 DIAGNOSIS — T86818 Other complications of lung transplant: Secondary | ICD-10-CM | POA: Diagnosis not present

## 2017-04-10 DIAGNOSIS — Z4824 Encounter for aftercare following lung transplant: Secondary | ICD-10-CM | POA: Diagnosis not present

## 2017-04-10 DIAGNOSIS — Z79899 Other long term (current) drug therapy: Secondary | ICD-10-CM | POA: Diagnosis not present

## 2017-04-10 DIAGNOSIS — R05 Cough: Secondary | ICD-10-CM | POA: Diagnosis not present

## 2017-04-10 DIAGNOSIS — J8410 Pulmonary fibrosis, unspecified: Secondary | ICD-10-CM | POA: Diagnosis not present

## 2017-04-10 DIAGNOSIS — N183 Chronic kidney disease, stage 3 (moderate): Secondary | ICD-10-CM | POA: Diagnosis not present

## 2017-04-10 DIAGNOSIS — Z942 Lung transplant status: Secondary | ICD-10-CM | POA: Diagnosis not present

## 2017-04-19 DIAGNOSIS — Z942 Lung transplant status: Secondary | ICD-10-CM | POA: Diagnosis not present

## 2017-04-19 DIAGNOSIS — T86818 Other complications of lung transplant: Secondary | ICD-10-CM | POA: Diagnosis not present

## 2017-04-26 DIAGNOSIS — T86818 Other complications of lung transplant: Secondary | ICD-10-CM | POA: Diagnosis not present

## 2017-05-25 DIAGNOSIS — T86818 Other complications of lung transplant: Secondary | ICD-10-CM | POA: Diagnosis not present

## 2017-06-05 DIAGNOSIS — E119 Type 2 diabetes mellitus without complications: Secondary | ICD-10-CM | POA: Diagnosis not present

## 2017-06-05 DIAGNOSIS — Z794 Long term (current) use of insulin: Secondary | ICD-10-CM | POA: Diagnosis not present

## 2017-06-12 DIAGNOSIS — E661 Drug-induced obesity: Secondary | ICD-10-CM | POA: Diagnosis not present

## 2017-06-12 DIAGNOSIS — B0229 Other postherpetic nervous system involvement: Secondary | ICD-10-CM | POA: Diagnosis not present

## 2017-06-12 DIAGNOSIS — J84112 Idiopathic pulmonary fibrosis: Secondary | ICD-10-CM | POA: Diagnosis not present

## 2017-06-12 DIAGNOSIS — N400 Enlarged prostate without lower urinary tract symptoms: Secondary | ICD-10-CM | POA: Diagnosis not present

## 2017-06-12 DIAGNOSIS — N183 Chronic kidney disease, stage 3 (moderate): Secondary | ICD-10-CM | POA: Diagnosis not present

## 2017-06-12 DIAGNOSIS — G4733 Obstructive sleep apnea (adult) (pediatric): Secondary | ICD-10-CM | POA: Diagnosis not present

## 2017-06-12 DIAGNOSIS — Z942 Lung transplant status: Secondary | ICD-10-CM | POA: Diagnosis not present

## 2017-06-12 DIAGNOSIS — E785 Hyperlipidemia, unspecified: Secondary | ICD-10-CM | POA: Diagnosis not present

## 2017-06-12 DIAGNOSIS — I129 Hypertensive chronic kidney disease with stage 1 through stage 4 chronic kidney disease, or unspecified chronic kidney disease: Secondary | ICD-10-CM | POA: Diagnosis not present

## 2017-06-12 DIAGNOSIS — Z85828 Personal history of other malignant neoplasm of skin: Secondary | ICD-10-CM | POA: Diagnosis not present

## 2017-06-12 DIAGNOSIS — T86819 Unspecified complication of lung transplant: Secondary | ICD-10-CM | POA: Diagnosis not present

## 2017-06-12 DIAGNOSIS — J8410 Pulmonary fibrosis, unspecified: Secondary | ICD-10-CM | POA: Diagnosis not present

## 2017-06-12 DIAGNOSIS — E1165 Type 2 diabetes mellitus with hyperglycemia: Secondary | ICD-10-CM | POA: Diagnosis not present

## 2017-06-12 DIAGNOSIS — D899 Disorder involving the immune mechanism, unspecified: Secondary | ICD-10-CM | POA: Diagnosis not present

## 2017-06-12 DIAGNOSIS — E669 Obesity, unspecified: Secondary | ICD-10-CM | POA: Diagnosis not present

## 2017-06-12 DIAGNOSIS — E1122 Type 2 diabetes mellitus with diabetic chronic kidney disease: Secondary | ICD-10-CM | POA: Diagnosis not present

## 2017-06-20 DIAGNOSIS — T86818 Other complications of lung transplant: Secondary | ICD-10-CM | POA: Diagnosis not present

## 2017-06-26 DIAGNOSIS — T86818 Other complications of lung transplant: Secondary | ICD-10-CM | POA: Diagnosis not present

## 2017-07-04 ENCOUNTER — Ambulatory Visit (INDEPENDENT_AMBULATORY_CARE_PROVIDER_SITE_OTHER): Payer: Medicare Other | Admitting: Nurse Practitioner

## 2017-07-04 ENCOUNTER — Encounter: Payer: Self-pay | Admitting: Nurse Practitioner

## 2017-07-04 VITALS — BP 143/78 | HR 84 | Resp 16 | Ht 69.0 in | Wt 225.0 lb

## 2017-07-04 DIAGNOSIS — Z942 Lung transplant status: Secondary | ICD-10-CM | POA: Diagnosis not present

## 2017-07-04 DIAGNOSIS — J0140 Acute pansinusitis, unspecified: Secondary | ICD-10-CM

## 2017-07-04 DIAGNOSIS — T86818 Other complications of lung transplant: Secondary | ICD-10-CM | POA: Diagnosis not present

## 2017-07-04 DIAGNOSIS — I10 Essential (primary) hypertension: Secondary | ICD-10-CM | POA: Diagnosis not present

## 2017-07-04 MED ORDER — AMOXICILLIN-POT CLAVULANATE 875-125 MG PO TABS: 1.0000 | ORAL_TABLET | Freq: Two times a day (BID) | ORAL | 0 refills | Status: DC

## 2017-07-04 NOTE — Progress Notes (Addendum)
[facility] [place], [place] [address]  Internal MEDICINE  Office Visit Note  Patient Name: Caleb [name]  [id number]  [id number]  Date of Service: 07/04/2017  Chief Complaint  Patient presents with  . Sinusitis  . Cough    The patient is here for follow up visit. Has had congestin, sinus pressure, sore throat, and headache for past few weeks. 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    Sinusitis  This is a new problem. The current episode started 1 to 4 weeks ago. The problem is unchanged. There has been no fever. He is experiencing no pain. Associated symptoms include congestion, coughing, headaches, a hoarse voice, sinus pressure and a sore throat. Pertinent negatives include no chills. Past treatments include acetaminophen. The treatment provided mild relief.    Pt is here for routine follow up.    Current Medication: Outpatient Encounter Medications as of 07/04/2017  Medication Sig  . amLODipine (NORVASC) 5 MG tablet Take 1 tablet (5 mg total) by mouth daily.  Marland Kitchen aspirin 81 MG tablet Take 81 mg by mouth daily.  . Cholecalciferol (D3 MAXIMUM STRENGTH) 5000 UNITS capsule Take 5,000 Units by mouth daily.  . enalapril (VASOTEC) 20 MG tablet Take 20 mg by mouth daily.  Marland Kitchen glipiZIDE (GLUCOTROL) 5 MG tablet TK 1 T PO BID B MEALS  . glucose blood (ONETOUCH VERIO) test strip TEST FOUR TIMES DAILY  . Lancets 28G MISC Use as instructed QID  . lovastatin (MEVACOR) 20 MG tablet Take 20 mg by mouth daily.  . meloxicam (MOBIC) 7.5 MG tablet TAKE 1 TABLET BY MOUTH EVERY DAY  . Multiple Vitamin (MULTI-VITAMINS) TABS Take by mouth.  . ondansetron (ZOFRAN ODT) 4 MG disintegrating tablet Take 1 tablet (4 mg total) by mouth every 8 (eight) hours as needed for nausea or vomiting.  . pantoprazole (PROTONIX) 40 MG tablet Take 40 mg by mouth daily.  .  predniSONE (DELTASONE) 5 MG tablet TK 1 T PO  QD  . Saxagliptin-Metformin (KOMBIGLYZE XR) 2.09-998 MG TB24 Take 1 tablet by mouth 2 (two) times daily.  . tamsulosin (FLOMAX) 0.4 MG CAPS Take 0.4 mg by mouth daily after breakfast.  . dutasteride (AVODART) 0.5 MG capsule Take 0.5 mg by mouth daily.  Marland Kitchen glimepiride (AMARYL) 2 MG tablet Take 2 mg by mouth daily with breakfast.  . [DISCONTINUED] benzonatate (TESSALON) 100 MG capsule Take 100 mg by mouth 3 (three) times daily as needed for cough.  . [DISCONTINUED] enalapril (VASOTEC) 20 MG tablet TAKE 1 TABLET BY MOUTH EVERY DAY   No facility-administered encounter medications on file as of 07/04/2017.     Surgical History: Past Surgical History:  Procedure Laterality Date  . LUNG TRANSPLANT      Medical History: Past Medical History:  Diagnosis Date  . BPH (benign prostatic hyperplasia)   . Chest pain, unspecified   . Diabetes mellitus without complication ([place])   . Dyspnea   . Hyperlipidemia   . Hypertension     Family History: Family History  Problem Relation Age of Onset  . Coronary artery disease Other   . Diabetes Other     Social History   Socioeconomic History  . Marital status: Married    Spouse name: Not on file  . Number of children: Not on file  . Years of education: Not on file  .  Highest education level: Not on file  Social Needs  . Financial resource strain: Not on file  . Food insecurity - worry: Not on file  . Food insecurity - inability: Not on file  . Transportation needs - medical: Not on file  . Transportation needs - non-medical: Not on file  Occupational History  . Not on file  Tobacco Use  . Smoking status: Former Smoker    Types: Cigarettes    Last attempt to quit: 05/16/1979    Years since quitting: 38.1  . Smokeless tobacco: Never Used  . Tobacco comment: Quit 1980  Substance and Sexual Activity  . Alcohol use: Yes  . Drug use: No  . Sexual activity: Not on file  Other Topics Concern  .  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       Review of Systems  Constitutional: Positive for fatigue. Negative for chills, fever and unexpected weight change.  HENT: Positive for congestion, hoarse voice, postnasal drip, rhinorrhea, sinus pressure, sinus pain, sore throat and voice change.   Eyes: Negative.   Respiratory: Positive for cough and wheezing.   Cardiovascular: Negative for chest pain and palpitations.  Gastrointestinal: Positive for nausea. Negative for vomiting.  Endocrine:       Diabetes is well controlled and managed per [facility].   Musculoskeletal: Negative for arthralgias, back pain and myalgias.  Skin: Negative.   Allergic/Immunologic: Positive for environmental allergies.  Neurological: Positive for headaches.  Hematological: Negative for adenopathy. Does not bruise/bleed easily.  Psychiatric/Behavioral: The patient is not nervous/anxious.     Today's Vitals   07/04/17 1529  BP: (!) 143/78  Pulse: 84  Resp: 16  SpO2: 95%  Weight: 225 lb (102.1 kg)  Height: 5\' 9"  (1.753 m)    Physical Exam  Constitutional: He is oriented to person, place, and time. He appears well-developed and well-nourished. No distress.  HENT:  Head: Normocephalic and atraumatic.  Right Ear: Tympanic membrane is erythematous and bulging.  Left Ear: Tympanic membrane is erythematous and bulging.  Nose: Rhinorrhea present. Right sinus exhibits maxillary sinus tenderness and frontal sinus tenderness. Left sinus exhibits maxillary sinus tenderness and frontal sinus tenderness.  Mouth/Throat: Posterior oropharyngeal erythema present. No oropharyngeal exudate.  Eyes: EOM are normal. Pupils are equal, round, and reactive to light.  Neck: Normal range of motion. Neck supple. No JVD present. No tracheal deviation present. No thyromegaly present.  Cardiovascular: Normal rate, regular rhythm and normal heart sounds. Exam reveals  no gallop and no friction rub.  No murmur heard. Pulmonary/Chest: Effort normal and breath sounds normal. No respiratory distress. He has no wheezes. He has no rales. He exhibits no tenderness.  Breath sounds are mild/moderatly diminished on left side.   Abdominal: Soft. Bowel sounds are normal. There is no tenderness.  Musculoskeletal: Normal range of motion.  Lymphadenopathy:    He has cervical adenopathy.  Neurological: He is alert and oriented to person, place, and time. No cranial nerve deficit.  Skin: Skin is warm and dry. He is not diaphoretic.  Psychiatric: He has a normal mood and affect. His behavior is normal. Judgment and thought content normal.  Nursing note and vitals reviewed.    Assessment/Plan: 1. Acute non-recurrent pansinusitis - amoxicillin-clavulanate (AUGMENTIN) 875-125 MG tablet; Take 1 tablet by mouth 2 (two) times daily.  Dispense: 20 tablet; Refill: 0 Continue OTC mucinex as indicated to relieve congestion. May take tylenol as needed and as indicated  for headache/fever  2. Essential hypertension Generally stable. Continue bp meds as prescribed.   3. H/O lung transplant [facility]) rglar visits with [facility] as scheduled.   General Counseling: junius faucett understanding of the findings of todays visit and agrees with plan of treatment. I have discussed any further diagnostic evaluation that may be needed or ordered today. We also reviewed his medications today. he has been encouraged to call the office with any questions or concerns that should arise related to todays visit.   This patient was seen by Leretha Pol, FNP- C in Collaboration with Dr Lavera Guise as a part of collaborative care agreement    Time spent: 30 Minutes   Dr Lavera Guise Internal medicine

## 2017-07-05 DIAGNOSIS — Z942 Lung transplant status: Secondary | ICD-10-CM | POA: Insufficient documentation

## 2017-07-17 DIAGNOSIS — R918 Other nonspecific abnormal finding of lung field: Secondary | ICD-10-CM | POA: Diagnosis not present

## 2017-07-17 DIAGNOSIS — Z7952 Long term (current) use of systemic steroids: Secondary | ICD-10-CM | POA: Diagnosis not present

## 2017-07-17 DIAGNOSIS — J219 Acute bronchiolitis, unspecified: Secondary | ICD-10-CM | POA: Diagnosis not present

## 2017-07-17 DIAGNOSIS — R05 Cough: Secondary | ICD-10-CM | POA: Diagnosis not present

## 2017-07-17 DIAGNOSIS — Z794 Long term (current) use of insulin: Secondary | ICD-10-CM | POA: Diagnosis not present

## 2017-07-17 DIAGNOSIS — Z942 Lung transplant status: Secondary | ICD-10-CM | POA: Diagnosis not present

## 2017-07-17 DIAGNOSIS — Z5181 Encounter for therapeutic drug level monitoring: Secondary | ICD-10-CM | POA: Diagnosis not present

## 2017-07-17 DIAGNOSIS — Z79899 Other long term (current) drug therapy: Secondary | ICD-10-CM | POA: Diagnosis not present

## 2017-07-17 DIAGNOSIS — E119 Type 2 diabetes mellitus without complications: Secondary | ICD-10-CM | POA: Diagnosis not present

## 2017-07-17 DIAGNOSIS — T86818 Other complications of lung transplant: Secondary | ICD-10-CM | POA: Diagnosis not present

## 2017-07-17 DIAGNOSIS — Z7982 Long term (current) use of aspirin: Secondary | ICD-10-CM | POA: Diagnosis not present

## 2017-07-18 DIAGNOSIS — Z85828 Personal history of other malignant neoplasm of skin: Secondary | ICD-10-CM | POA: Diagnosis not present

## 2017-07-18 DIAGNOSIS — D2270 Melanocytic nevi of unspecified lower limb, including hip: Secondary | ICD-10-CM | POA: Diagnosis not present

## 2017-07-18 DIAGNOSIS — L814 Other melanin hyperpigmentation: Secondary | ICD-10-CM | POA: Diagnosis not present

## 2017-07-18 DIAGNOSIS — L570 Actinic keratosis: Secondary | ICD-10-CM | POA: Diagnosis not present

## 2017-07-18 DIAGNOSIS — D1801 Hemangioma of skin and subcutaneous tissue: Secondary | ICD-10-CM | POA: Diagnosis not present

## 2017-07-18 DIAGNOSIS — E119 Type 2 diabetes mellitus without complications: Secondary | ICD-10-CM | POA: Diagnosis not present

## 2017-07-18 DIAGNOSIS — L821 Other seborrheic keratosis: Secondary | ICD-10-CM | POA: Diagnosis not present

## 2017-07-18 DIAGNOSIS — D899 Disorder involving the immune mechanism, unspecified: Secondary | ICD-10-CM | POA: Diagnosis not present

## 2017-07-18 DIAGNOSIS — D225 Melanocytic nevi of trunk: Secondary | ICD-10-CM | POA: Diagnosis not present

## 2017-07-18 DIAGNOSIS — D2260 Melanocytic nevi of unspecified upper limb, including shoulder: Secondary | ICD-10-CM | POA: Diagnosis not present

## 2017-08-02 DIAGNOSIS — D0439 Carcinoma in situ of skin of other parts of face: Secondary | ICD-10-CM | POA: Diagnosis not present

## 2017-08-21 DIAGNOSIS — T86818 Other complications of lung transplant: Secondary | ICD-10-CM | POA: Diagnosis not present

## 2017-09-06 DIAGNOSIS — T86818 Other complications of lung transplant: Secondary | ICD-10-CM | POA: Diagnosis not present

## 2017-09-18 DIAGNOSIS — T86818 Other complications of lung transplant: Secondary | ICD-10-CM | POA: Diagnosis not present

## 2017-09-21 DIAGNOSIS — T86818 Other complications of lung transplant: Secondary | ICD-10-CM | POA: Diagnosis not present

## 2017-09-21 DIAGNOSIS — Z5181 Encounter for therapeutic drug level monitoring: Secondary | ICD-10-CM | POA: Diagnosis not present

## 2017-09-21 DIAGNOSIS — Z79899 Other long term (current) drug therapy: Secondary | ICD-10-CM | POA: Diagnosis not present

## 2017-10-02 DIAGNOSIS — R5383 Other fatigue: Secondary | ICD-10-CM | POA: Diagnosis not present

## 2017-10-02 DIAGNOSIS — I444 Left anterior fascicular block: Secondary | ICD-10-CM | POA: Diagnosis not present

## 2017-10-02 DIAGNOSIS — R0602 Shortness of breath: Secondary | ICD-10-CM | POA: Diagnosis not present

## 2017-10-02 DIAGNOSIS — R200 Anesthesia of skin: Secondary | ICD-10-CM | POA: Diagnosis not present

## 2017-10-02 DIAGNOSIS — J8410 Pulmonary fibrosis, unspecified: Secondary | ICD-10-CM | POA: Diagnosis not present

## 2017-10-02 DIAGNOSIS — R42 Dizziness and giddiness: Secondary | ICD-10-CM | POA: Diagnosis not present

## 2017-10-02 DIAGNOSIS — Z942 Lung transplant status: Secondary | ICD-10-CM | POA: Diagnosis not present

## 2017-10-02 DIAGNOSIS — R202 Paresthesia of skin: Secondary | ICD-10-CM | POA: Diagnosis not present

## 2017-10-02 DIAGNOSIS — N189 Chronic kidney disease, unspecified: Secondary | ICD-10-CM | POA: Diagnosis not present

## 2017-10-02 DIAGNOSIS — R0789 Other chest pain: Secondary | ICD-10-CM | POA: Diagnosis not present

## 2017-10-02 DIAGNOSIS — J189 Pneumonia, unspecified organism: Secondary | ICD-10-CM | POA: Diagnosis not present

## 2017-10-02 DIAGNOSIS — R05 Cough: Secondary | ICD-10-CM | POA: Diagnosis not present

## 2017-10-02 DIAGNOSIS — I129 Hypertensive chronic kidney disease with stage 1 through stage 4 chronic kidney disease, or unspecified chronic kidney disease: Secondary | ICD-10-CM | POA: Diagnosis not present

## 2017-10-02 DIAGNOSIS — E871 Hypo-osmolality and hyponatremia: Secondary | ICD-10-CM | POA: Diagnosis not present

## 2017-10-02 DIAGNOSIS — J479 Bronchiectasis, uncomplicated: Secondary | ICD-10-CM | POA: Diagnosis not present

## 2017-10-02 DIAGNOSIS — I447 Left bundle-branch block, unspecified: Secondary | ICD-10-CM | POA: Diagnosis not present

## 2017-10-02 DIAGNOSIS — R918 Other nonspecific abnormal finding of lung field: Secondary | ICD-10-CM | POA: Diagnosis not present

## 2017-10-02 DIAGNOSIS — Z87891 Personal history of nicotine dependence: Secondary | ICD-10-CM | POA: Diagnosis not present

## 2017-10-02 DIAGNOSIS — R911 Solitary pulmonary nodule: Secondary | ICD-10-CM | POA: Diagnosis not present

## 2017-10-02 DIAGNOSIS — R110 Nausea: Secondary | ICD-10-CM | POA: Diagnosis not present

## 2017-10-05 DIAGNOSIS — J849 Interstitial pulmonary disease, unspecified: Secondary | ICD-10-CM | POA: Diagnosis not present

## 2017-10-05 DIAGNOSIS — N400 Enlarged prostate without lower urinary tract symptoms: Secondary | ICD-10-CM | POA: Diagnosis not present

## 2017-10-05 DIAGNOSIS — Z79899 Other long term (current) drug therapy: Secondary | ICD-10-CM | POA: Diagnosis not present

## 2017-10-05 DIAGNOSIS — Z9989 Dependence on other enabling machines and devices: Secondary | ICD-10-CM | POA: Diagnosis not present

## 2017-10-05 DIAGNOSIS — J8410 Pulmonary fibrosis, unspecified: Secondary | ICD-10-CM | POA: Diagnosis not present

## 2017-10-05 DIAGNOSIS — E669 Obesity, unspecified: Secondary | ICD-10-CM | POA: Diagnosis not present

## 2017-10-05 DIAGNOSIS — Z942 Lung transplant status: Secondary | ICD-10-CM | POA: Diagnosis not present

## 2017-10-05 DIAGNOSIS — Z5181 Encounter for therapeutic drug level monitoring: Secondary | ICD-10-CM | POA: Diagnosis not present

## 2017-10-05 DIAGNOSIS — G4733 Obstructive sleep apnea (adult) (pediatric): Secondary | ICD-10-CM | POA: Diagnosis not present

## 2017-10-05 DIAGNOSIS — E785 Hyperlipidemia, unspecified: Secondary | ICD-10-CM | POA: Diagnosis not present

## 2017-10-05 DIAGNOSIS — D899 Disorder involving the immune mechanism, unspecified: Secondary | ICD-10-CM | POA: Diagnosis not present

## 2017-10-05 DIAGNOSIS — R918 Other nonspecific abnormal finding of lung field: Secondary | ICD-10-CM | POA: Diagnosis not present

## 2017-10-05 DIAGNOSIS — Z85828 Personal history of other malignant neoplasm of skin: Secondary | ICD-10-CM | POA: Diagnosis not present

## 2017-10-05 DIAGNOSIS — Z4824 Encounter for aftercare following lung transplant: Secondary | ICD-10-CM | POA: Diagnosis not present

## 2017-10-05 DIAGNOSIS — E1165 Type 2 diabetes mellitus with hyperglycemia: Secondary | ICD-10-CM | POA: Diagnosis not present

## 2017-10-05 DIAGNOSIS — T86818 Other complications of lung transplant: Secondary | ICD-10-CM | POA: Diagnosis not present

## 2017-10-05 DIAGNOSIS — E1122 Type 2 diabetes mellitus with diabetic chronic kidney disease: Secondary | ICD-10-CM | POA: Diagnosis not present

## 2017-10-05 DIAGNOSIS — I10 Essential (primary) hypertension: Secondary | ICD-10-CM | POA: Diagnosis not present

## 2017-10-05 DIAGNOSIS — K219 Gastro-esophageal reflux disease without esophagitis: Secondary | ICD-10-CM | POA: Diagnosis not present

## 2017-10-05 DIAGNOSIS — N183 Chronic kidney disease, stage 3 (moderate): Secondary | ICD-10-CM | POA: Diagnosis not present

## 2017-10-05 DIAGNOSIS — I129 Hypertensive chronic kidney disease with stage 1 through stage 4 chronic kidney disease, or unspecified chronic kidney disease: Secondary | ICD-10-CM | POA: Diagnosis not present

## 2017-10-05 DIAGNOSIS — B0229 Other postherpetic nervous system involvement: Secondary | ICD-10-CM | POA: Diagnosis not present

## 2017-10-05 DIAGNOSIS — R0600 Dyspnea, unspecified: Secondary | ICD-10-CM | POA: Diagnosis not present

## 2017-10-06 DIAGNOSIS — J8410 Pulmonary fibrosis, unspecified: Secondary | ICD-10-CM | POA: Diagnosis not present

## 2017-10-06 DIAGNOSIS — T86818 Other complications of lung transplant: Secondary | ICD-10-CM | POA: Diagnosis not present

## 2017-10-06 DIAGNOSIS — Z7982 Long term (current) use of aspirin: Secondary | ICD-10-CM | POA: Diagnosis not present

## 2017-10-06 DIAGNOSIS — T86810 Lung transplant rejection: Secondary | ICD-10-CM | POA: Diagnosis not present

## 2017-10-06 DIAGNOSIS — E785 Hyperlipidemia, unspecified: Secondary | ICD-10-CM | POA: Diagnosis not present

## 2017-10-06 DIAGNOSIS — Z79899 Other long term (current) drug therapy: Secondary | ICD-10-CM | POA: Diagnosis not present

## 2017-10-06 DIAGNOSIS — E119 Type 2 diabetes mellitus without complications: Secondary | ICD-10-CM | POA: Diagnosis not present

## 2017-10-06 DIAGNOSIS — G4733 Obstructive sleep apnea (adult) (pediatric): Secondary | ICD-10-CM | POA: Diagnosis not present

## 2017-10-06 DIAGNOSIS — I10 Essential (primary) hypertension: Secondary | ICD-10-CM | POA: Diagnosis not present

## 2017-10-06 DIAGNOSIS — Z942 Lung transplant status: Secondary | ICD-10-CM | POA: Diagnosis not present

## 2017-10-06 DIAGNOSIS — R942 Abnormal results of pulmonary function studies: Secondary | ICD-10-CM | POA: Diagnosis not present

## 2017-10-06 DIAGNOSIS — Z794 Long term (current) use of insulin: Secondary | ICD-10-CM | POA: Diagnosis not present

## 2017-10-06 DIAGNOSIS — K219 Gastro-esophageal reflux disease without esophagitis: Secondary | ICD-10-CM | POA: Diagnosis not present

## 2017-10-06 DIAGNOSIS — J449 Chronic obstructive pulmonary disease, unspecified: Secondary | ICD-10-CM | POA: Diagnosis not present

## 2017-10-06 DIAGNOSIS — R05 Cough: Secondary | ICD-10-CM | POA: Diagnosis not present

## 2017-10-06 DIAGNOSIS — T86819 Unspecified complication of lung transplant: Secondary | ICD-10-CM | POA: Diagnosis not present

## 2017-10-06 DIAGNOSIS — J9809 Other diseases of bronchus, not elsewhere classified: Secondary | ICD-10-CM | POA: Diagnosis not present

## 2017-10-06 DIAGNOSIS — N400 Enlarged prostate without lower urinary tract symptoms: Secondary | ICD-10-CM | POA: Diagnosis not present

## 2017-10-09 DIAGNOSIS — Z5181 Encounter for therapeutic drug level monitoring: Secondary | ICD-10-CM | POA: Diagnosis not present

## 2017-10-09 DIAGNOSIS — T86818 Other complications of lung transplant: Secondary | ICD-10-CM | POA: Diagnosis not present

## 2017-10-09 DIAGNOSIS — I519 Heart disease, unspecified: Secondary | ICD-10-CM | POA: Diagnosis not present

## 2017-10-09 DIAGNOSIS — Z79899 Other long term (current) drug therapy: Secondary | ICD-10-CM | POA: Diagnosis not present

## 2017-10-09 DIAGNOSIS — R931 Abnormal findings on diagnostic imaging of heart and coronary circulation: Secondary | ICD-10-CM | POA: Diagnosis not present

## 2017-10-09 DIAGNOSIS — I517 Cardiomegaly: Secondary | ICD-10-CM | POA: Diagnosis not present

## 2017-10-14 DIAGNOSIS — G4733 Obstructive sleep apnea (adult) (pediatric): Secondary | ICD-10-CM | POA: Diagnosis present

## 2017-10-14 DIAGNOSIS — E875 Hyperkalemia: Secondary | ICD-10-CM | POA: Diagnosis present

## 2017-10-14 DIAGNOSIS — N401 Enlarged prostate with lower urinary tract symptoms: Secondary | ICD-10-CM | POA: Diagnosis present

## 2017-10-14 DIAGNOSIS — Z87891 Personal history of nicotine dependence: Secondary | ICD-10-CM | POA: Diagnosis not present

## 2017-10-14 DIAGNOSIS — E1122 Type 2 diabetes mellitus with diabetic chronic kidney disease: Secondary | ICD-10-CM | POA: Diagnosis present

## 2017-10-14 DIAGNOSIS — I129 Hypertensive chronic kidney disease with stage 1 through stage 4 chronic kidney disease, or unspecified chronic kidney disease: Secondary | ICD-10-CM | POA: Diagnosis present

## 2017-10-14 DIAGNOSIS — R1312 Dysphagia, oropharyngeal phase: Secondary | ICD-10-CM | POA: Diagnosis present

## 2017-10-14 DIAGNOSIS — N183 Chronic kidney disease, stage 3 (moderate): Secondary | ICD-10-CM | POA: Diagnosis present

## 2017-10-14 DIAGNOSIS — Z942 Lung transplant status: Secondary | ICD-10-CM | POA: Diagnosis not present

## 2017-10-14 DIAGNOSIS — I776 Arteritis, unspecified: Secondary | ICD-10-CM | POA: Diagnosis present

## 2017-10-14 DIAGNOSIS — Z85828 Personal history of other malignant neoplasm of skin: Secondary | ICD-10-CM | POA: Diagnosis not present

## 2017-10-14 DIAGNOSIS — T86810 Lung transplant rejection: Secondary | ICD-10-CM | POA: Diagnosis present

## 2017-10-14 DIAGNOSIS — K219 Gastro-esophageal reflux disease without esophagitis: Secondary | ICD-10-CM | POA: Diagnosis present

## 2017-10-14 DIAGNOSIS — E785 Hyperlipidemia, unspecified: Secondary | ICD-10-CM | POA: Diagnosis present

## 2017-10-14 DIAGNOSIS — R918 Other nonspecific abnormal finding of lung field: Secondary | ICD-10-CM | POA: Diagnosis not present

## 2017-10-14 DIAGNOSIS — E1165 Type 2 diabetes mellitus with hyperglycemia: Secondary | ICD-10-CM | POA: Diagnosis present

## 2017-10-21 DIAGNOSIS — N179 Acute kidney failure, unspecified: Secondary | ICD-10-CM | POA: Diagnosis not present

## 2017-10-21 DIAGNOSIS — R918 Other nonspecific abnormal finding of lung field: Secondary | ICD-10-CM | POA: Diagnosis not present

## 2017-10-21 DIAGNOSIS — E119 Type 2 diabetes mellitus without complications: Secondary | ICD-10-CM | POA: Diagnosis not present

## 2017-10-21 DIAGNOSIS — I517 Cardiomegaly: Secondary | ICD-10-CM | POA: Diagnosis not present

## 2017-10-21 DIAGNOSIS — Z942 Lung transplant status: Secondary | ICD-10-CM | POA: Diagnosis not present

## 2017-10-21 DIAGNOSIS — R600 Localized edema: Secondary | ICD-10-CM | POA: Diagnosis not present

## 2017-10-21 DIAGNOSIS — E1122 Type 2 diabetes mellitus with diabetic chronic kidney disease: Secondary | ICD-10-CM | POA: Diagnosis present

## 2017-10-21 DIAGNOSIS — R0602 Shortness of breath: Secondary | ICD-10-CM | POA: Diagnosis not present

## 2017-10-21 DIAGNOSIS — E7849 Other hyperlipidemia: Secondary | ICD-10-CM | POA: Diagnosis not present

## 2017-10-21 DIAGNOSIS — M549 Dorsalgia, unspecified: Secondary | ICD-10-CM | POA: Diagnosis not present

## 2017-10-21 DIAGNOSIS — G4733 Obstructive sleep apnea (adult) (pediatric): Secondary | ICD-10-CM | POA: Diagnosis present

## 2017-10-21 DIAGNOSIS — R21 Rash and other nonspecific skin eruption: Secondary | ICD-10-CM | POA: Diagnosis not present

## 2017-10-21 DIAGNOSIS — R1312 Dysphagia, oropharyngeal phase: Secondary | ICD-10-CM | POA: Diagnosis present

## 2017-10-21 DIAGNOSIS — R202 Paresthesia of skin: Secondary | ICD-10-CM | POA: Diagnosis not present

## 2017-10-21 DIAGNOSIS — E871 Hypo-osmolality and hyponatremia: Secondary | ICD-10-CM | POA: Diagnosis not present

## 2017-10-21 DIAGNOSIS — R5383 Other fatigue: Secondary | ICD-10-CM | POA: Diagnosis not present

## 2017-10-21 DIAGNOSIS — T8069XS Other serum reaction due to other serum, sequela: Secondary | ICD-10-CM | POA: Diagnosis not present

## 2017-10-21 DIAGNOSIS — N183 Chronic kidney disease, stage 3 (moderate): Secondary | ICD-10-CM | POA: Diagnosis present

## 2017-10-21 DIAGNOSIS — J449 Chronic obstructive pulmonary disease, unspecified: Secondary | ICD-10-CM | POA: Diagnosis present

## 2017-10-21 DIAGNOSIS — T8069XA Other serum reaction due to other serum, initial encounter: Secondary | ICD-10-CM | POA: Diagnosis present

## 2017-10-21 DIAGNOSIS — T86810 Lung transplant rejection: Secondary | ICD-10-CM | POA: Diagnosis not present

## 2017-10-21 DIAGNOSIS — L539 Erythematous condition, unspecified: Secondary | ICD-10-CM | POA: Diagnosis not present

## 2017-10-21 DIAGNOSIS — E1165 Type 2 diabetes mellitus with hyperglycemia: Secondary | ICD-10-CM | POA: Diagnosis present

## 2017-10-21 DIAGNOSIS — E785 Hyperlipidemia, unspecified: Secondary | ICD-10-CM | POA: Diagnosis present

## 2017-10-21 DIAGNOSIS — R5381 Other malaise: Secondary | ICD-10-CM | POA: Diagnosis not present

## 2017-10-21 DIAGNOSIS — E875 Hyperkalemia: Secondary | ICD-10-CM | POA: Diagnosis not present

## 2017-10-21 DIAGNOSIS — Z9989 Dependence on other enabling machines and devices: Secondary | ICD-10-CM | POA: Diagnosis not present

## 2017-10-21 DIAGNOSIS — Z85828 Personal history of other malignant neoplasm of skin: Secondary | ICD-10-CM | POA: Diagnosis not present

## 2017-10-21 DIAGNOSIS — D696 Thrombocytopenia, unspecified: Secondary | ICD-10-CM | POA: Diagnosis not present

## 2017-10-21 DIAGNOSIS — R200 Anesthesia of skin: Secondary | ICD-10-CM | POA: Diagnosis not present

## 2017-10-21 DIAGNOSIS — E860 Dehydration: Secondary | ICD-10-CM | POA: Diagnosis not present

## 2017-10-21 DIAGNOSIS — R0789 Other chest pain: Secondary | ICD-10-CM | POA: Diagnosis not present

## 2017-10-21 DIAGNOSIS — K219 Gastro-esophageal reflux disease without esophagitis: Secondary | ICD-10-CM | POA: Diagnosis present

## 2017-10-21 DIAGNOSIS — R062 Wheezing: Secondary | ICD-10-CM | POA: Diagnosis not present

## 2017-10-21 DIAGNOSIS — N401 Enlarged prostate with lower urinary tract symptoms: Secondary | ICD-10-CM | POA: Diagnosis present

## 2017-10-21 DIAGNOSIS — I129 Hypertensive chronic kidney disease with stage 1 through stage 4 chronic kidney disease, or unspecified chronic kidney disease: Secondary | ICD-10-CM | POA: Diagnosis present

## 2017-10-22 DIAGNOSIS — T8069XS Other serum reaction due to other serum, sequela: Secondary | ICD-10-CM | POA: Diagnosis not present

## 2017-10-22 DIAGNOSIS — R21 Rash and other nonspecific skin eruption: Secondary | ICD-10-CM | POA: Diagnosis not present

## 2017-10-22 DIAGNOSIS — E860 Dehydration: Secondary | ICD-10-CM | POA: Diagnosis not present

## 2017-10-22 DIAGNOSIS — N179 Acute kidney failure, unspecified: Secondary | ICD-10-CM | POA: Diagnosis not present

## 2017-10-22 DIAGNOSIS — I517 Cardiomegaly: Secondary | ICD-10-CM | POA: Diagnosis not present

## 2017-10-30 DIAGNOSIS — Z942 Lung transplant status: Secondary | ICD-10-CM | POA: Diagnosis not present

## 2017-10-30 DIAGNOSIS — Z79899 Other long term (current) drug therapy: Secondary | ICD-10-CM | POA: Diagnosis not present

## 2017-10-30 DIAGNOSIS — T86818 Other complications of lung transplant: Secondary | ICD-10-CM | POA: Diagnosis not present

## 2017-10-30 DIAGNOSIS — Z5181 Encounter for therapeutic drug level monitoring: Secondary | ICD-10-CM | POA: Diagnosis not present

## 2017-11-06 DIAGNOSIS — Z79899 Other long term (current) drug therapy: Secondary | ICD-10-CM | POA: Diagnosis not present

## 2017-11-06 DIAGNOSIS — T86810 Lung transplant rejection: Secondary | ICD-10-CM | POA: Diagnosis not present

## 2017-11-06 DIAGNOSIS — Z5181 Encounter for therapeutic drug level monitoring: Secondary | ICD-10-CM | POA: Diagnosis not present

## 2017-11-13 DIAGNOSIS — T86810 Lung transplant rejection: Secondary | ICD-10-CM | POA: Diagnosis not present

## 2017-11-20 DIAGNOSIS — T86810 Lung transplant rejection: Secondary | ICD-10-CM | POA: Diagnosis not present

## 2017-11-20 DIAGNOSIS — Z79899 Other long term (current) drug therapy: Secondary | ICD-10-CM | POA: Diagnosis not present

## 2017-11-20 DIAGNOSIS — Z5181 Encounter for therapeutic drug level monitoring: Secondary | ICD-10-CM | POA: Diagnosis not present

## 2017-11-21 DIAGNOSIS — Z942 Lung transplant status: Secondary | ICD-10-CM | POA: Diagnosis not present

## 2017-11-21 DIAGNOSIS — Z79899 Other long term (current) drug therapy: Secondary | ICD-10-CM | POA: Diagnosis not present

## 2017-11-21 DIAGNOSIS — D899 Disorder involving the immune mechanism, unspecified: Secondary | ICD-10-CM | POA: Diagnosis not present

## 2017-11-21 DIAGNOSIS — J8410 Pulmonary fibrosis, unspecified: Secondary | ICD-10-CM | POA: Diagnosis not present

## 2017-11-21 DIAGNOSIS — Z4824 Encounter for aftercare following lung transplant: Secondary | ICD-10-CM | POA: Diagnosis not present

## 2017-11-21 DIAGNOSIS — G4733 Obstructive sleep apnea (adult) (pediatric): Secondary | ICD-10-CM | POA: Diagnosis not present

## 2017-11-21 DIAGNOSIS — T86818 Other complications of lung transplant: Secondary | ICD-10-CM | POA: Diagnosis not present

## 2017-11-21 DIAGNOSIS — Z5181 Encounter for therapeutic drug level monitoring: Secondary | ICD-10-CM | POA: Diagnosis not present

## 2017-11-27 DIAGNOSIS — Z5181 Encounter for therapeutic drug level monitoring: Secondary | ICD-10-CM | POA: Diagnosis not present

## 2017-11-27 DIAGNOSIS — Z79899 Other long term (current) drug therapy: Secondary | ICD-10-CM | POA: Diagnosis not present

## 2017-11-27 DIAGNOSIS — T86810 Lung transplant rejection: Secondary | ICD-10-CM | POA: Diagnosis not present

## 2017-11-28 DIAGNOSIS — T86810 Lung transplant rejection: Secondary | ICD-10-CM | POA: Diagnosis not present

## 2017-12-19 DIAGNOSIS — Z79899 Other long term (current) drug therapy: Secondary | ICD-10-CM | POA: Diagnosis not present

## 2017-12-19 DIAGNOSIS — T17890A Other foreign object in other parts of respiratory tract causing asphyxiation, initial encounter: Secondary | ICD-10-CM | POA: Diagnosis not present

## 2017-12-19 DIAGNOSIS — Z5181 Encounter for therapeutic drug level monitoring: Secondary | ICD-10-CM | POA: Diagnosis not present

## 2017-12-19 DIAGNOSIS — T86818 Other complications of lung transplant: Secondary | ICD-10-CM | POA: Diagnosis not present

## 2017-12-19 DIAGNOSIS — T86810 Lung transplant rejection: Secondary | ICD-10-CM | POA: Diagnosis not present

## 2017-12-19 DIAGNOSIS — R918 Other nonspecific abnormal finding of lung field: Secondary | ICD-10-CM | POA: Diagnosis not present

## 2017-12-19 DIAGNOSIS — Z8709 Personal history of other diseases of the respiratory system: Secondary | ICD-10-CM | POA: Diagnosis not present

## 2017-12-19 DIAGNOSIS — D899 Disorder involving the immune mechanism, unspecified: Secondary | ICD-10-CM | POA: Diagnosis not present

## 2017-12-19 DIAGNOSIS — Z942 Lung transplant status: Secondary | ICD-10-CM | POA: Diagnosis not present

## 2017-12-19 DIAGNOSIS — R760 Raised antibody titer: Secondary | ICD-10-CM | POA: Diagnosis not present

## 2017-12-19 DIAGNOSIS — E119 Type 2 diabetes mellitus without complications: Secondary | ICD-10-CM | POA: Diagnosis not present

## 2017-12-29 DIAGNOSIS — T86810 Lung transplant rejection: Secondary | ICD-10-CM | POA: Diagnosis not present

## 2018-01-01 ENCOUNTER — Ambulatory Visit (INDEPENDENT_AMBULATORY_CARE_PROVIDER_SITE_OTHER): Payer: Medicare Other | Admitting: Nurse Practitioner

## 2018-01-01 ENCOUNTER — Encounter: Payer: Self-pay | Admitting: Nurse Practitioner

## 2018-01-01 VITALS — BP 121/75 | HR 82 | Resp 16 | Ht 69.0 in | Wt 208.4 lb

## 2018-01-01 DIAGNOSIS — Z942 Lung transplant status: Secondary | ICD-10-CM

## 2018-01-01 DIAGNOSIS — I10 Essential (primary) hypertension: Secondary | ICD-10-CM

## 2018-01-01 DIAGNOSIS — E1165 Type 2 diabetes mellitus with hyperglycemia: Secondary | ICD-10-CM | POA: Diagnosis not present

## 2018-01-01 LAB — POCT GLYCOSYLATED HEMOGLOBIN (HGB A1C): HEMOGLOBIN A1C: 8.7 % — AB (ref 4.0–5.6)

## 2018-01-01 NOTE — Progress Notes (Signed)
[facility], [place] [address]  Internal MEDICINE  Office Visit Note  Patient Name: Caleb [name]  [id number]  [id number]  Date of Service: 01/01/2018  Chief Complaint  Patient presents with  . Sinusitis    6 month follow up    The patient is currently c/o shortness of breath and elevated blood sugars. 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 He is receiving "chemotherapy" treatments as well as oral and IV steroids to lessen rejection. He had infusion and steroid treatments last week. Due for another next month as well as new bronch to determine status of rejection. He is taking glipizide 5mg  orally, and is now using insulin prior to meals on sliding scale to help control his blood sugars. He feels very fatigued and tight in the chest. Oxygen saturations are good.       Current Medication: Outpatient Encounter Medications as of 01/01/2018  Medication Sig  . amLODipine (NORVASC) 5 MG tablet Take 1 tablet (5 mg total) by mouth daily.  Marland Kitchen amoxicillin-clavulanate (AUGMENTIN) 875-125 MG tablet Take 1 tablet by mouth 2 (two) times daily.  Marland Kitchen aspirin 81 MG tablet Take 81 mg by mouth daily.  . Cholecalciferol (D3 MAXIMUM STRENGTH) 5000 UNITS capsule Take 5,000 Units by mouth daily.  Marland Kitchen dutasteride (AVODART) 0.5 MG capsule Take 0.5 mg by mouth daily.  . enalapril (VASOTEC) 20 MG tablet Take 20 mg by mouth daily.  Marland Kitchen glipiZIDE (GLUCOTROL) 5 MG tablet TK 1 T PO BID B MEALS  . glucose blood (ONETOUCH VERIO) test strip TEST FOUR TIMES DAILY  . Lancets 28G MISC Use as instructed QID  . lovastatin (MEVACOR) 20 MG tablet Take 20 mg by mouth daily.  . meloxicam (MOBIC) 7.5 MG tablet TAKE 1 TABLET BY MOUTH EVERY DAY  . Multiple Vitamin (MULTI-VITAMINS) TABS Take by mouth.  . ondansetron (ZOFRAN ODT) 4 MG disintegrating tablet Take 1 tablet (4 mg total) by mouth every 8 (eight) hours as needed for nausea or vomiting.  . pantoprazole  (PROTONIX) 40 MG tablet Take 40 mg by mouth daily.  . predniSONE (DELTASONE) 5 MG tablet TK 1 T PO  QD  . Saxagliptin-Metformin (KOMBIGLYZE XR) 2.09-998 MG TB24 Take 1 tablet by mouth 2 (two) times daily.  . tamsulosin (FLOMAX) 0.4 MG CAPS Take 0.4 mg by mouth daily after breakfast.  . [DISCONTINUED] glimepiride (AMARYL) 2 MG tablet Take 2 mg by mouth daily with breakfast.   No facility-administered encounter medications on file as of 01/01/2018.     Surgical History: Past Surgical History:  Procedure Laterality Date  . LUNG TRANSPLANT      Medical History: Past Medical History:  Diagnosis Date  . BPH (benign prostatic hyperplasia)   . Chest pain, unspecified   . Diabetes mellitus without complication ([place])   . Dyspnea   . Hyperlipidemia   . Hypertension     Family History: Family History  Problem Relation Age of Onset  . Coronary artery disease Other   . Diabetes Other     Social History   Socioeconomic History  . Marital status: Married    Spouse name: Not on file  . Number of children: Not on file  . Years of education: Not on file  . Highest education level: Not on file  Occupational History  . Not on file  Social Needs  . Financial resource strain: Not on file  . Food insecurity:    Worry: Not on file  Inability: Not on file  . Transportation needs:    Medical: Not on file    Non-medical: Not on file  Tobacco Use  . Smoking status: Former Smoker    Types: Cigarettes    Last attempt to quit: 05/16/1979    Years since quitting: 38.6  . Smokeless tobacco: Never Used  . Tobacco comment: Quit 1980  Substance and Sexual Activity  . Alcohol use: Not Currently  . Drug use: No  . Sexual activity: Not on file  Lifestyle  . Physical activity:    Days per week: Not on file    Minutes per session: Not on file  . Stress: Not on file  Relationships  . Social connections:    Talks on phone: Not on file    Gets together: Not on file    Attends religious  service: Not on file    Active member of club or organization: Not on file    Attends meetings of clubs or organizations: Not on file    Relationship status: Not on file  . Intimate partner violence:    Fear of current or ex partner: Not on file    Emotionally abused: Not on file    Physically abused: Not on file    Forced sexual activity: Not on file  Other Topics Concern  . 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       Review of Systems  Constitutional: Positive for fatigue. Negative for chills and unexpected weight change.  HENT: Negative for congestion, postnasal drip, rhinorrhea, sneezing and sore throat.   Eyes: Negative.  Negative for redness.  Respiratory: Positive for cough, chest tightness, shortness of breath and wheezing.   Cardiovascular: Negative for chest pain and palpitations.  Gastrointestinal: Negative for abdominal pain, constipation, diarrhea, nausea and vomiting.  Endocrine: Negative for cold intolerance, heat intolerance, polydipsia, polyphagia and polyuria.       Elevated blood sugars. Using sliding scale insulin at this point to help control sugar.   Genitourinary: Negative for dysuria and frequency.  Musculoskeletal: Negative for arthralgias, back pain, joint swelling and neck pain.  Skin: Negative for rash.  Allergic/Immunologic: Negative for environmental allergies.  Neurological: Negative for dizziness, tremors, numbness and headaches.  Hematological: Negative for adenopathy. Does not bruise/bleed easily.  Psychiatric/Behavioral: Negative for behavioral problems (Depression), sleep disturbance and suicidal ideas. The patient is not nervous/anxious.     Vital Signs: BP 121/75 (BP Location: Left Arm, Patient Position: Sitting, Cuff Size: Normal)   Pulse 82   Resp 16   Ht 5\' 9"  (1.753 m)   Wt 208 lb 6.4 oz (94.5 kg)   SpO2 97%   BMI 30.78 kg/m    Physical Exam  Constitutional: He is oriented  to person, place, and time. He appears well-developed and well-nourished. No distress.  HENT:  Head: Normocephalic and atraumatic.  Nose: Nose normal.  Mouth/Throat: Oropharynx is clear and moist. No oropharyngeal exudate.  Eyes: Pupils are equal, round, and reactive to light. Conjunctivae and EOM are normal.  Neck: Normal range of motion. Neck supple. No JVD present. Carotid bruit is not present. No tracheal deviation present. No thyromegaly present.  Cardiovascular: Normal rate, regular rhythm and normal heart sounds. Exam reveals no gallop and no friction rub.  No murmur heard. Pulmonary/Chest: Effort normal. No respiratory distress. He has no wheezes. He has no rales. He exhibits no tenderness.  coarse crackles auscultated over the left lower lobe of  the lungs. Breath sounds are otherwise clesr. He does have congested cough present.   Abdominal: Soft. Bowel sounds are normal. There is no tenderness.  Musculoskeletal: Normal range of motion.  Lymphadenopathy:    He has no cervical adenopathy.  Neurological: He is alert and oriented to person, place, and time. No cranial nerve deficit.  Skin: Skin is warm and dry. He is not diaphoretic.  Psychiatric: He has a normal mood and affect. His behavior is normal. Judgment and thought content normal.  Nursing note and vitals reviewed.  Assessment/Plan: 1. Uncontrolled type 2 diabetes mellitus with hyperglycemia (HCC) - POCT HgB A1C 8.7 today. Now taking insulin prior to meals, based on sliding scale. Continue glipizide 5mg  daily. Refer out for diabetic eye exam.  - Ambulatory referral to Ophthalmology  2. Essential hypertension Stable. Continue bp medication as prescribed .  3. H/O lung transplant ([place]) Followed by [facility]. Reviewed labs. Appointments as scheduled.   General Counseling: coleston dirosa understanding of the findings of todays visit and agrees with plan of treatment. I have discussed any further diagnostic  evaluation that may be needed or ordered today. We also reviewed his medications today. he has been encouraged to call the office with any questions or concerns that should arise related to todays visit.  Diabetes Counseling:  1. Addition of ACE inh/ ARB'S for nephroprotection. Microalbumin is updated  2. Diabetic foot care, prevention of complications. Podiatry consult 3. Exercise and lose weight.  4. Diabetic eye examination, Diabetic eye exam is updated  5. Monitor blood sugar closlely. nutrition counseling.  6. Sign and symptoms of hypoglycemia including shaking sweating,confusion and headaches.   This patient was seen by Leretha Pol FNP Collaboration with Dr Lavera Guise as a part of collaborative care agreement  Orders Placed This Encounter  Procedures  . Ambulatory referral to Ophthalmology  . POCT HgB A1C      Time spent: 15 Minutes      Dr Lavera Guise Internal medicine

## 2018-01-05 DIAGNOSIS — Z942 Lung transplant status: Secondary | ICD-10-CM | POA: Diagnosis not present

## 2018-01-05 DIAGNOSIS — T86818 Other complications of lung transplant: Secondary | ICD-10-CM | POA: Diagnosis not present

## 2018-01-10 DIAGNOSIS — E113292 Type 2 diabetes mellitus with mild nonproliferative diabetic retinopathy without macular edema, left eye: Secondary | ICD-10-CM | POA: Diagnosis not present

## 2018-01-17 DIAGNOSIS — T86818 Other complications of lung transplant: Secondary | ICD-10-CM | POA: Diagnosis not present

## 2018-01-18 DIAGNOSIS — L568 Other specified acute skin changes due to ultraviolet radiation: Secondary | ICD-10-CM | POA: Diagnosis not present

## 2018-01-18 DIAGNOSIS — L821 Other seborrheic keratosis: Secondary | ICD-10-CM | POA: Diagnosis not present

## 2018-01-18 DIAGNOSIS — D1801 Hemangioma of skin and subcutaneous tissue: Secondary | ICD-10-CM | POA: Diagnosis not present

## 2018-01-18 DIAGNOSIS — L814 Other melanin hyperpigmentation: Secondary | ICD-10-CM | POA: Diagnosis not present

## 2018-01-18 DIAGNOSIS — D899 Disorder involving the immune mechanism, unspecified: Secondary | ICD-10-CM | POA: Diagnosis not present

## 2018-01-18 DIAGNOSIS — L570 Actinic keratosis: Secondary | ICD-10-CM | POA: Diagnosis not present

## 2018-01-18 DIAGNOSIS — Z85828 Personal history of other malignant neoplasm of skin: Secondary | ICD-10-CM | POA: Diagnosis not present

## 2018-01-30 DIAGNOSIS — J8410 Pulmonary fibrosis, unspecified: Secondary | ICD-10-CM | POA: Diagnosis not present

## 2018-01-30 DIAGNOSIS — M609 Myositis, unspecified: Secondary | ICD-10-CM | POA: Diagnosis not present

## 2018-01-30 DIAGNOSIS — Z942 Lung transplant status: Secondary | ICD-10-CM | POA: Diagnosis not present

## 2018-01-30 DIAGNOSIS — R942 Abnormal results of pulmonary function studies: Secondary | ICD-10-CM | POA: Diagnosis not present

## 2018-01-30 DIAGNOSIS — T86818 Other complications of lung transplant: Secondary | ICD-10-CM | POA: Diagnosis not present

## 2018-01-30 DIAGNOSIS — Z5181 Encounter for therapeutic drug level monitoring: Secondary | ICD-10-CM | POA: Diagnosis not present

## 2018-01-30 DIAGNOSIS — T86819 Unspecified complication of lung transplant: Secondary | ICD-10-CM | POA: Diagnosis not present

## 2018-01-30 DIAGNOSIS — R918 Other nonspecific abnormal finding of lung field: Secondary | ICD-10-CM | POA: Diagnosis not present

## 2018-01-30 DIAGNOSIS — J95811 Postprocedural pneumothorax: Secondary | ICD-10-CM | POA: Diagnosis not present

## 2018-01-30 DIAGNOSIS — T86810 Lung transplant rejection: Secondary | ICD-10-CM | POA: Diagnosis not present

## 2018-01-30 DIAGNOSIS — J939 Pneumothorax, unspecified: Secondary | ICD-10-CM | POA: Diagnosis not present

## 2018-01-30 DIAGNOSIS — R9389 Abnormal findings on diagnostic imaging of other specified body structures: Secondary | ICD-10-CM | POA: Diagnosis not present

## 2018-01-30 DIAGNOSIS — D899 Disorder involving the immune mechanism, unspecified: Secondary | ICD-10-CM | POA: Diagnosis not present

## 2018-01-31 DIAGNOSIS — R1312 Dysphagia, oropharyngeal phase: Secondary | ICD-10-CM | POA: Diagnosis not present

## 2018-01-31 DIAGNOSIS — R918 Other nonspecific abnormal finding of lung field: Secondary | ICD-10-CM | POA: Diagnosis not present

## 2018-01-31 DIAGNOSIS — I129 Hypertensive chronic kidney disease with stage 1 through stage 4 chronic kidney disease, or unspecified chronic kidney disease: Secondary | ICD-10-CM | POA: Diagnosis present

## 2018-01-31 DIAGNOSIS — J939 Pneumothorax, unspecified: Secondary | ICD-10-CM | POA: Diagnosis not present

## 2018-01-31 DIAGNOSIS — D899 Disorder involving the immune mechanism, unspecified: Secondary | ICD-10-CM | POA: Diagnosis not present

## 2018-01-31 DIAGNOSIS — E1122 Type 2 diabetes mellitus with diabetic chronic kidney disease: Secondary | ICD-10-CM | POA: Diagnosis present

## 2018-01-31 DIAGNOSIS — I517 Cardiomegaly: Secondary | ICD-10-CM | POA: Diagnosis not present

## 2018-01-31 DIAGNOSIS — T380X5A Adverse effect of glucocorticoids and synthetic analogues, initial encounter: Secondary | ICD-10-CM | POA: Diagnosis not present

## 2018-01-31 DIAGNOSIS — E1165 Type 2 diabetes mellitus with hyperglycemia: Secondary | ICD-10-CM | POA: Diagnosis not present

## 2018-01-31 DIAGNOSIS — J8410 Pulmonary fibrosis, unspecified: Secondary | ICD-10-CM | POA: Diagnosis not present

## 2018-01-31 DIAGNOSIS — N183 Chronic kidney disease, stage 3 (moderate): Secondary | ICD-10-CM | POA: Diagnosis present

## 2018-01-31 DIAGNOSIS — J449 Chronic obstructive pulmonary disease, unspecified: Secondary | ICD-10-CM | POA: Diagnosis present

## 2018-01-31 DIAGNOSIS — G4733 Obstructive sleep apnea (adult) (pediatric): Secondary | ICD-10-CM | POA: Diagnosis present

## 2018-01-31 DIAGNOSIS — T86810 Lung transplant rejection: Secondary | ICD-10-CM | POA: Diagnosis present

## 2018-01-31 DIAGNOSIS — Z942 Lung transplant status: Secondary | ICD-10-CM | POA: Diagnosis not present

## 2018-01-31 DIAGNOSIS — J95811 Postprocedural pneumothorax: Secondary | ICD-10-CM | POA: Diagnosis present

## 2018-01-31 DIAGNOSIS — E669 Obesity, unspecified: Secondary | ICD-10-CM | POA: Diagnosis present

## 2018-01-31 DIAGNOSIS — K219 Gastro-esophageal reflux disease without esophagitis: Secondary | ICD-10-CM | POA: Diagnosis present

## 2018-01-31 DIAGNOSIS — N400 Enlarged prostate without lower urinary tract symptoms: Secondary | ICD-10-CM | POA: Diagnosis present

## 2018-01-31 DIAGNOSIS — Z6831 Body mass index (BMI) 31.0-31.9, adult: Secondary | ICD-10-CM | POA: Diagnosis not present

## 2018-01-31 DIAGNOSIS — Z7982 Long term (current) use of aspirin: Secondary | ICD-10-CM | POA: Diagnosis not present

## 2018-01-31 DIAGNOSIS — Z794 Long term (current) use of insulin: Secondary | ICD-10-CM | POA: Diagnosis not present

## 2018-01-31 DIAGNOSIS — Z85828 Personal history of other malignant neoplasm of skin: Secondary | ICD-10-CM | POA: Diagnosis not present

## 2018-01-31 DIAGNOSIS — E785 Hyperlipidemia, unspecified: Secondary | ICD-10-CM | POA: Diagnosis present

## 2018-01-31 DIAGNOSIS — I361 Nonrheumatic tricuspid (valve) insufficiency: Secondary | ICD-10-CM | POA: Diagnosis not present

## 2018-02-16 ENCOUNTER — Encounter: Payer: Self-pay | Admitting: Adult Health

## 2018-02-16 ENCOUNTER — Ambulatory Visit (INDEPENDENT_AMBULATORY_CARE_PROVIDER_SITE_OTHER): Payer: Medicare Other | Admitting: Adult Health

## 2018-02-16 DIAGNOSIS — T797XXS Traumatic subcutaneous emphysema, sequela: Secondary | ICD-10-CM | POA: Diagnosis not present

## 2018-02-16 DIAGNOSIS — R300 Dysuria: Secondary | ICD-10-CM | POA: Diagnosis not present

## 2018-02-16 DIAGNOSIS — Z0001 Encounter for general adult medical examination with abnormal findings: Secondary | ICD-10-CM | POA: Diagnosis not present

## 2018-02-16 DIAGNOSIS — I10 Essential (primary) hypertension: Secondary | ICD-10-CM

## 2018-02-16 DIAGNOSIS — Z942 Lung transplant status: Secondary | ICD-10-CM | POA: Diagnosis not present

## 2018-02-16 DIAGNOSIS — Z125 Encounter for screening for malignant neoplasm of prostate: Secondary | ICD-10-CM | POA: Diagnosis not present

## 2018-02-16 DIAGNOSIS — E1165 Type 2 diabetes mellitus with hyperglycemia: Secondary | ICD-10-CM

## 2018-02-16 NOTE — Patient Instructions (Signed)

## 2018-02-16 NOTE — Progress Notes (Signed)
[facility] [place], [place] [address]  Internal MEDICINE  Office Visit Note  Patient Name: Caleb [name]  [id number]  [id number]  Date of Service: 02/22/2018  Chief Complaint  Patient presents with  . Annual Exam    medicare wellness visit   . Diabetes  . Hypertension  . Hyperlipidemia   HPI Pt is here for routine health maintenance examination.  The patient is a 72 yo well nourished white male. 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  He had a right sided transplant, they left his native left lung in place, however patient reports it is "not doing much". He reports his blood sugars have improved now that he is off the steroids.  His blood pressure is WNL today.  He reports good results with the Vasotec and Avodart. Seventeen days ago patient had a bronchoscopy to follow up on a level two rejection.  This bronchoscopy caused a pneumothorax.  He was admitted to the hospital for two days and discharged being told that the hole was still there and that they would watch it.  Pt presents today for AWV, and has what appears to be subcutaneous air at base of neck over left clavicle.  He denies pain, or increased SOB.  States he noticed this two days ago, and feels like it has gotten bigger.     Current Medication: Outpatient Encounter Medications as of 02/16/2018  Medication Sig  . acetaminophen (TYLENOL) 325 MG tablet Take 650 mg by mouth every 6 (six) hours as needed.  Marland Kitchen aspirin 81 MG tablet Take 81 mg by mouth daily.  Marland Kitchen azithromycin (ZITHROMAX) 250 MG tablet Take by mouth daily.  . calcium citrate-vitamin D (CITRACAL+D) 315-200 MG-UNIT tablet Take 1 tablet by mouth 2 (two) times daily.  . Cholecalciferol (VITAMIN D3) 1000 units CAPS Take by mouth.  . fexofenadine (ALLEGRA) 180 MG tablet Take 180 mg by mouth daily.  . furosemide (LASIX) 20 MG tablet Take 20 mg by mouth. One tablet by mouth three  times a week Sunday , Tuesday , friday  . glipiZIDE (GLUCOTROL) 5 MG tablet TK 1 T PO BID B MEALS  . Immune Globulin 10% (GAMUNEX-C) 10 GM/100ML SOLN Inject into the vein. Inject 40 g into the vein monthly  . Isavuconazonium Sulfate (CRESEMBA) 186 MG CAPS Take by mouth.  . lovastatin (MEVACOR) 20 MG tablet Take 20 mg by mouth daily.  . metoprolol tartrate (LOPRESSOR) 25 MG tablet TK 1/2 T PO BID  . Multiple Vitamin (MULTI-VITAMINS) TABS Take by mouth.  . mycophenolate (CELLCEPT) 250 MG capsule Take by mouth 2 (two) times daily. Take four capsules by mouth every twelve hours  . pantoprazole (PROTONIX) 40 MG tablet Take 40 mg by mouth 2 (two) times daily.   . predniSONE (DELTASONE) 5 MG tablet TK 1 T PO  QD  . tacrolimus (PROGRAF) 0.5 MG capsule 02/01/18: Take in combination with 1 mg tablets for a total of 1 mg by mouth in the morning, and 0.5 mg in the evening.  . tacrolimus (PROGRAF) 1 MG capsule 02/01/18: Take in combination with 0.5 mg tablets for a total of 1 mg by mouth in the morning, and 0.5 mg in the evening.  . tamsulosin (FLOMAX) 0.4 MG CAPS Take 0.4 mg by mouth daily after breakfast.  . dutasteride (AVODART) 0.5 MG capsule Take 0.5 mg by mouth daily.  . enalapril (VASOTEC) 20 MG tablet Take 20  mg by mouth daily.  Marland Kitchen glucose blood (ONETOUCH VERIO) test strip TEST FOUR TIMES DAILY  . Lancets 28G MISC Use as instructed QID  . [DISCONTINUED] amLODipine (NORVASC) 5 MG tablet Take 1 tablet (5 mg total) by mouth daily. (Patient not taking: Reported on 02/16/2018)  . [DISCONTINUED] amoxicillin-clavulanate (AUGMENTIN) 875-125 MG tablet Take 1 tablet by mouth 2 (two) times daily. (Patient not taking: Reported on 02/16/2018)  . [DISCONTINUED] Cholecalciferol (D3 MAXIMUM STRENGTH) 5000 UNITS capsule Take 5,000 Units by mouth daily.  . [DISCONTINUED] meloxicam (MOBIC) 7.5 MG tablet TAKE 1 TABLET BY MOUTH EVERY DAY  . [DISCONTINUED] ondansetron (ZOFRAN ODT) 4 MG disintegrating tablet Take 1 tablet (4 mg  total) by mouth every 8 (eight) hours as needed for nausea or vomiting.  . [DISCONTINUED] Saxagliptin-Metformin (KOMBIGLYZE XR) 2.09-998 MG TB24 Take 1 tablet by mouth 2 (two) times daily.   No facility-administered encounter medications on file as of 02/16/2018.    Surgical History: Past Surgical History:  Procedure Laterality Date  . LUNG TRANSPLANT     Medical History: Past Medical History:  Diagnosis Date  . BPH (benign prostatic hyperplasia)   . Chest pain, unspecified   . Diabetes mellitus without complication ([place])   . Dyspnea   . Hyperlipidemia   . Hypertension    Family History: Family History  Problem Relation Age of Onset  . Coronary artery disease Other   . Diabetes Other    Review of Systems  Constitutional: Negative.  Negative for chills, fatigue and unexpected weight change.  HENT: Negative.  Negative for congestion, rhinorrhea, sneezing and sore throat.   Eyes: Negative for redness.  Respiratory: Negative.  Negative for cough, chest tightness and shortness of breath.   Cardiovascular: Negative.  Negative for chest pain and palpitations.  Gastrointestinal: Negative.  Negative for abdominal pain, constipation, diarrhea, nausea and vomiting.  Endocrine: Negative.   Genitourinary: Negative.  Negative for dysuria and frequency.  Musculoskeletal: Negative.  Negative for arthralgias, back pain, joint swelling and neck pain.  Skin: Negative.  Negative for rash.  Allergic/Immunologic: Negative.   Neurological: Negative.  Negative for tremors and numbness.  Hematological: Negative for adenopathy. Does not bruise/bleed easily.  Psychiatric/Behavioral: Negative.  Negative for behavioral problems, sleep disturbance and suicidal ideas. The patient is not nervous/anxious.   Vital Signs: BP 122/78   Pulse 77   Resp 16   Ht 5\' 9"  (1.753 m)   Wt 206 lb 3.2 oz (93.5 kg)   SpO2 96%   BMI 30.45 kg/m   Physical Exam  Constitutional: He is oriented to person, place, and  time. He appears well-developed and well-nourished. No distress.  HENT:  Head: Normocephalic and atraumatic.  Mouth/Throat: Oropharynx is clear and moist. No oropharyngeal exudate.  Eyes: Pupils are equal, round, and reactive to light. EOM are normal.  Neck: Normal range of motion. Neck supple. No JVD present. No tracheal deviation present. No thyromegaly present.  Cardiovascular: Normal rate, regular rhythm and normal heart sounds. Exam reveals no gallop and no friction rub.  No murmur heard. Pulmonary/Chest: Effort normal and breath sounds normal. No respiratory distress. He has no wheezes. He has no rales. He exhibits no tenderness.  Abdominal: Soft. There is no tenderness. There is no guarding.  Musculoskeletal: Normal range of motion.  Lymphadenopathy:    He has no cervical adenopathy.  Neurological: He is alert and oriented to person, place, and time. No cranial nerve deficit.  Skin: Skin is warm and dry. He is not diaphoretic.  Psychiatric:  He has a normal mood and affect. His behavior is normal. Judgment and thought content normal.  Nursing note and vitals reviewed.   LABS: Recent Results (from the past 2160 hour(s))  POCT HgB A1C     Status: Abnormal   Collection Time: 01/01/18  9:16 AM  Result Value Ref Range   Hemoglobin A1C 8.7 (A) 4.0 - 5.6 %   HbA1c POC (<> result, manual entry)  4.0 - 5.6 %   HbA1c, POC (prediabetic range)  5.7 - 6.4 %   HbA1c, POC (controlled diabetic range)  0.0 - 7.0 %  UA/M w/rflx Culture, Routine     Status: None   Collection Time: 02/16/18  9:38 AM  Result Value Ref Range   Specific Gravity, UA 1.009 1.005 - 1.030   pH, UA 6.5 5.0 - 7.5   Color, UA Yellow Yellow   Appearance Ur Clear Clear   Leukocytes, UA Negative Negative   Protein, UA Negative Negative/Trace   Glucose, UA Negative Negative   Ketones, UA Negative Negative   RBC, UA Negative Negative   Bilirubin, UA Negative Negative   Urobilinogen, Ur 0.2 0.2 - 1.0 mg/dL   Nitrite, UA  Negative Negative   Microscopic Examination Comment     Comment: Microscopic follows if indicated.   Microscopic Examination See below:     Comment: Microscopic was indicated and was performed.   Urinalysis Reflex Comment     Comment: This specimen will not reflex to a Urine Culture.  Microscopic Examination     Status: Abnormal   Collection Time: 02/16/18  9:38 AM  Result Value Ref Range   WBC, UA None seen 0 - 5 /hpf   RBC, UA None seen 0 - 2 /hpf   Epithelial Cells (non renal) None seen 0 - 10 /hpf   Casts Present (A) None seen /lpf   Cast Type Hyaline casts N/A   Bacteria, UA None seen None seen/Few   Depression screen [facility] 2/9 02/16/2018 01/01/2018 07/04/2017  Decreased Interest 0 3 0  Down, Depressed, Hopeless 0 3 0  PHQ - 2 Score 0 6 0  Altered sleeping - 3 -  Tired, decreased energy - 3 -  Change in appetite - 3 -  Feeling bad or failure about yourself  - 1 -  Trouble concentrating - 0 -  Moving slowly or fidgety/restless - 0 -  Suicidal thoughts - 0 -  PHQ-9 Score - 16 -    Functional Status Survey: Is the patient deaf or have difficulty hearing?: Yes Does the patient have difficulty seeing, even when wearing glasses/contacts?: No Does the patient have difficulty concentrating, remembering, or making decisions?: No Does the patient have difficulty walking or climbing stairs?: Yes Does the patient have difficulty dressing or bathing?: No Does the patient have difficulty doing errands alone such as visiting a doctor's office or shopping?: No  MMSE - Mini Mental State Exam 02/16/2018  Orientation to time 5  Orientation to Place 5  Registration 3  Attention/ Calculation 5  Recall 3  Language- name 2 objects 2  Language- repeat 1  Language- follow 3 step command 3  Language- read & follow direction 1  Write a sentence 1  Copy design 1  Total score 30    Fall Risk  02/16/2018 01/01/2018 07/04/2017  Falls in the past year? No No No   Assessment/Plan: 1. Encounter for  general adult medical examination with abnormal findings Pt declined labs at this visit, they will be drawn  in 4 days at transplant visit.    2. Dysuria - UA/M w/rflx Culture, Routine  3. Uncontrolled type 2 diabetes mellitus with hyperglycemia (HCC) Last A1C one month ago was elevated.  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   4. Essential hypertension Stable, continue current medications.   5. H/O lung transplant ([place]) Continue to be followed by [facility] Transplant team.  6. Subcutaneous air, sequela Pt refused chest x-ray, denies overwhelming symptoms.  Will get chest x-ray at transplant follow up on Wednesday.   7. Screening for prostate cancer Pt declined to do blood work, since he has it done 4 days from now at [facility] for transplant team, and due to cost of using [facility].  General Counseling: Caleb [name] understanding of the findings of todays visit and agrees with plan of treatment. I have discussed any further diagnostic evaluation that may be needed or ordered today. We also reviewed his medications today. he has been encouraged to call the office with any questions or concerns that should arise related to todays visit.   Orders Placed This Encounter  Procedures  . Microscopic Examination  . UA/M w/rflx Culture, Routine    Time spent: 30 Minutes  This patient was seen by Orson Gear AGNP-C in Collaboration with Dr Lavera Guise as a part of collaborative care agreement   Lavera Guise, MD  Internal Medicine

## 2018-02-17 LAB — MICROSCOPIC EXAMINATION
BACTERIA UA: NONE SEEN
EPITHELIAL CELLS (NON RENAL): NONE SEEN /HPF (ref 0–10)
RBC, UA: NONE SEEN /hpf (ref 0–2)
WBC, UA: NONE SEEN /hpf (ref 0–5)

## 2018-02-17 LAB — UA/M W/RFLX CULTURE, ROUTINE
BILIRUBIN UA: NEGATIVE
GLUCOSE, UA: NEGATIVE
KETONES UA: NEGATIVE
Leukocytes, UA: NEGATIVE
NITRITE UA: NEGATIVE
Protein, UA: NEGATIVE
RBC UA: NEGATIVE
Specific Gravity, UA: 1.009 (ref 1.005–1.030)
UUROB: 0.2 mg/dL (ref 0.2–1.0)
pH, UA: 6.5 (ref 5.0–7.5)

## 2018-02-20 DIAGNOSIS — N183 Chronic kidney disease, stage 3 (moderate): Secondary | ICD-10-CM | POA: Diagnosis not present

## 2018-02-20 DIAGNOSIS — J939 Pneumothorax, unspecified: Secondary | ICD-10-CM | POA: Diagnosis not present

## 2018-02-20 DIAGNOSIS — G4733 Obstructive sleep apnea (adult) (pediatric): Secondary | ICD-10-CM | POA: Diagnosis not present

## 2018-02-20 DIAGNOSIS — E785 Hyperlipidemia, unspecified: Secondary | ICD-10-CM | POA: Diagnosis not present

## 2018-02-20 DIAGNOSIS — R918 Other nonspecific abnormal finding of lung field: Secondary | ICD-10-CM | POA: Diagnosis not present

## 2018-02-20 DIAGNOSIS — E669 Obesity, unspecified: Secondary | ICD-10-CM | POA: Diagnosis not present

## 2018-02-20 DIAGNOSIS — Z942 Lung transplant status: Secondary | ICD-10-CM | POA: Diagnosis not present

## 2018-02-20 DIAGNOSIS — D049 Carcinoma in situ of skin, unspecified: Secondary | ICD-10-CM | POA: Diagnosis not present

## 2018-02-20 DIAGNOSIS — N400 Enlarged prostate without lower urinary tract symptoms: Secondary | ICD-10-CM | POA: Diagnosis not present

## 2018-02-20 DIAGNOSIS — T86818 Other complications of lung transplant: Secondary | ICD-10-CM | POA: Diagnosis not present

## 2018-02-20 DIAGNOSIS — Z5181 Encounter for therapeutic drug level monitoring: Secondary | ICD-10-CM | POA: Diagnosis not present

## 2018-02-20 DIAGNOSIS — E1122 Type 2 diabetes mellitus with diabetic chronic kidney disease: Secondary | ICD-10-CM | POA: Diagnosis not present

## 2018-02-20 DIAGNOSIS — K219 Gastro-esophageal reflux disease without esophagitis: Secondary | ICD-10-CM | POA: Diagnosis not present

## 2018-02-20 DIAGNOSIS — I129 Hypertensive chronic kidney disease with stage 1 through stage 4 chronic kidney disease, or unspecified chronic kidney disease: Secondary | ICD-10-CM | POA: Diagnosis not present

## 2018-02-20 DIAGNOSIS — D899 Disorder involving the immune mechanism, unspecified: Secondary | ICD-10-CM | POA: Diagnosis not present

## 2018-02-21 DIAGNOSIS — T86810 Lung transplant rejection: Secondary | ICD-10-CM | POA: Diagnosis not present

## 2018-02-28 DIAGNOSIS — T86818 Other complications of lung transplant: Secondary | ICD-10-CM | POA: Diagnosis not present

## 2018-03-06 DIAGNOSIS — N183 Chronic kidney disease, stage 3 (moderate): Secondary | ICD-10-CM | POA: Diagnosis not present

## 2018-03-06 DIAGNOSIS — J8410 Pulmonary fibrosis, unspecified: Secondary | ICD-10-CM | POA: Diagnosis not present

## 2018-03-06 DIAGNOSIS — Z6829 Body mass index (BMI) 29.0-29.9, adult: Secondary | ICD-10-CM | POA: Diagnosis not present

## 2018-03-06 DIAGNOSIS — E669 Obesity, unspecified: Secondary | ICD-10-CM | POA: Diagnosis not present

## 2018-03-06 DIAGNOSIS — Z4824 Encounter for aftercare following lung transplant: Secondary | ICD-10-CM | POA: Diagnosis not present

## 2018-03-06 DIAGNOSIS — D899 Disorder involving the immune mechanism, unspecified: Secondary | ICD-10-CM | POA: Diagnosis not present

## 2018-03-06 DIAGNOSIS — E785 Hyperlipidemia, unspecified: Secondary | ICD-10-CM | POA: Diagnosis not present

## 2018-03-06 DIAGNOSIS — K219 Gastro-esophageal reflux disease without esophagitis: Secondary | ICD-10-CM | POA: Diagnosis not present

## 2018-03-06 DIAGNOSIS — Z79899 Other long term (current) drug therapy: Secondary | ICD-10-CM | POA: Diagnosis not present

## 2018-03-06 DIAGNOSIS — J939 Pneumothorax, unspecified: Secondary | ICD-10-CM | POA: Diagnosis not present

## 2018-03-06 DIAGNOSIS — Z942 Lung transplant status: Secondary | ICD-10-CM | POA: Diagnosis not present

## 2018-03-06 DIAGNOSIS — Z5181 Encounter for therapeutic drug level monitoring: Secondary | ICD-10-CM | POA: Diagnosis not present

## 2018-03-06 DIAGNOSIS — N400 Enlarged prostate without lower urinary tract symptoms: Secondary | ICD-10-CM | POA: Diagnosis not present

## 2018-03-06 DIAGNOSIS — G4733 Obstructive sleep apnea (adult) (pediatric): Secondary | ICD-10-CM | POA: Diagnosis not present

## 2018-03-06 DIAGNOSIS — I129 Hypertensive chronic kidney disease with stage 1 through stage 4 chronic kidney disease, or unspecified chronic kidney disease: Secondary | ICD-10-CM | POA: Diagnosis not present

## 2018-03-06 DIAGNOSIS — R918 Other nonspecific abnormal finding of lung field: Secondary | ICD-10-CM | POA: Diagnosis not present

## 2018-03-06 DIAGNOSIS — T86818 Other complications of lung transplant: Secondary | ICD-10-CM | POA: Diagnosis not present

## 2018-03-06 DIAGNOSIS — R52 Pain, unspecified: Secondary | ICD-10-CM | POA: Diagnosis not present

## 2018-03-06 DIAGNOSIS — E1122 Type 2 diabetes mellitus with diabetic chronic kidney disease: Secondary | ICD-10-CM | POA: Diagnosis not present

## 2018-03-13 DIAGNOSIS — T86818 Other complications of lung transplant: Secondary | ICD-10-CM | POA: Diagnosis not present

## 2018-03-13 DIAGNOSIS — Z79899 Other long term (current) drug therapy: Secondary | ICD-10-CM | POA: Diagnosis not present

## 2018-03-13 DIAGNOSIS — Z5181 Encounter for therapeutic drug level monitoring: Secondary | ICD-10-CM | POA: Diagnosis not present

## 2018-03-21 DIAGNOSIS — T86810 Lung transplant rejection: Secondary | ICD-10-CM | POA: Diagnosis not present

## 2018-03-27 DIAGNOSIS — E785 Hyperlipidemia, unspecified: Secondary | ICD-10-CM | POA: Diagnosis not present

## 2018-03-27 DIAGNOSIS — N183 Chronic kidney disease, stage 3 (moderate): Secondary | ICD-10-CM | POA: Diagnosis not present

## 2018-03-27 DIAGNOSIS — E1122 Type 2 diabetes mellitus with diabetic chronic kidney disease: Secondary | ICD-10-CM | POA: Diagnosis not present

## 2018-03-27 DIAGNOSIS — Z6829 Body mass index (BMI) 29.0-29.9, adult: Secondary | ICD-10-CM | POA: Diagnosis not present

## 2018-03-27 DIAGNOSIS — G4733 Obstructive sleep apnea (adult) (pediatric): Secondary | ICD-10-CM | POA: Diagnosis not present

## 2018-03-27 DIAGNOSIS — Z942 Lung transplant status: Secondary | ICD-10-CM | POA: Diagnosis not present

## 2018-03-27 DIAGNOSIS — N400 Enlarged prostate without lower urinary tract symptoms: Secondary | ICD-10-CM | POA: Diagnosis not present

## 2018-03-27 DIAGNOSIS — I129 Hypertensive chronic kidney disease with stage 1 through stage 4 chronic kidney disease, or unspecified chronic kidney disease: Secondary | ICD-10-CM | POA: Diagnosis not present

## 2018-03-27 DIAGNOSIS — T86818 Other complications of lung transplant: Secondary | ICD-10-CM | POA: Diagnosis not present

## 2018-03-27 DIAGNOSIS — Z4824 Encounter for aftercare following lung transplant: Secondary | ICD-10-CM | POA: Diagnosis not present

## 2018-03-27 DIAGNOSIS — J42 Unspecified chronic bronchitis: Secondary | ICD-10-CM | POA: Diagnosis not present

## 2018-03-27 DIAGNOSIS — D72819 Decreased white blood cell count, unspecified: Secondary | ICD-10-CM | POA: Diagnosis not present

## 2018-03-27 DIAGNOSIS — E669 Obesity, unspecified: Secondary | ICD-10-CM | POA: Diagnosis not present

## 2018-03-27 DIAGNOSIS — D899 Disorder involving the immune mechanism, unspecified: Secondary | ICD-10-CM | POA: Diagnosis not present

## 2018-04-04 DIAGNOSIS — R197 Diarrhea, unspecified: Secondary | ICD-10-CM | POA: Diagnosis not present

## 2018-04-10 DIAGNOSIS — D899 Disorder involving the immune mechanism, unspecified: Secondary | ICD-10-CM | POA: Diagnosis not present

## 2018-04-10 DIAGNOSIS — J9811 Atelectasis: Secondary | ICD-10-CM | POA: Diagnosis not present

## 2018-04-10 DIAGNOSIS — R0602 Shortness of breath: Secondary | ICD-10-CM | POA: Diagnosis not present

## 2018-04-10 DIAGNOSIS — Z6829 Body mass index (BMI) 29.0-29.9, adult: Secondary | ICD-10-CM | POA: Diagnosis not present

## 2018-04-10 DIAGNOSIS — Z942 Lung transplant status: Secondary | ICD-10-CM | POA: Diagnosis not present

## 2018-04-10 DIAGNOSIS — D72819 Decreased white blood cell count, unspecified: Secondary | ICD-10-CM | POA: Diagnosis not present

## 2018-04-10 DIAGNOSIS — E785 Hyperlipidemia, unspecified: Secondary | ICD-10-CM | POA: Diagnosis not present

## 2018-04-10 DIAGNOSIS — N400 Enlarged prostate without lower urinary tract symptoms: Secondary | ICD-10-CM | POA: Diagnosis not present

## 2018-04-10 DIAGNOSIS — R918 Other nonspecific abnormal finding of lung field: Secondary | ICD-10-CM | POA: Diagnosis not present

## 2018-04-10 DIAGNOSIS — K219 Gastro-esophageal reflux disease without esophagitis: Secondary | ICD-10-CM | POA: Diagnosis not present

## 2018-04-10 DIAGNOSIS — N183 Chronic kidney disease, stage 3 (moderate): Secondary | ICD-10-CM | POA: Diagnosis not present

## 2018-04-10 DIAGNOSIS — R197 Diarrhea, unspecified: Secondary | ICD-10-CM | POA: Diagnosis not present

## 2018-04-10 DIAGNOSIS — Z4824 Encounter for aftercare following lung transplant: Secondary | ICD-10-CM | POA: Diagnosis not present

## 2018-04-10 DIAGNOSIS — I129 Hypertensive chronic kidney disease with stage 1 through stage 4 chronic kidney disease, or unspecified chronic kidney disease: Secondary | ICD-10-CM | POA: Diagnosis not present

## 2018-04-10 DIAGNOSIS — G4733 Obstructive sleep apnea (adult) (pediatric): Secondary | ICD-10-CM | POA: Diagnosis not present

## 2018-04-10 DIAGNOSIS — T86818 Other complications of lung transplant: Secondary | ICD-10-CM | POA: Diagnosis not present

## 2018-04-10 DIAGNOSIS — E669 Obesity, unspecified: Secondary | ICD-10-CM | POA: Diagnosis not present

## 2018-04-10 DIAGNOSIS — Z5181 Encounter for therapeutic drug level monitoring: Secondary | ICD-10-CM | POA: Diagnosis not present

## 2018-04-11 DIAGNOSIS — Z23 Encounter for immunization: Secondary | ICD-10-CM | POA: Diagnosis not present

## 2018-04-19 DIAGNOSIS — Z5181 Encounter for therapeutic drug level monitoring: Secondary | ICD-10-CM | POA: Diagnosis not present

## 2018-04-19 DIAGNOSIS — T86818 Other complications of lung transplant: Secondary | ICD-10-CM | POA: Diagnosis not present

## 2018-04-19 DIAGNOSIS — Z79899 Other long term (current) drug therapy: Secondary | ICD-10-CM | POA: Diagnosis not present

## 2018-04-25 DIAGNOSIS — G4733 Obstructive sleep apnea (adult) (pediatric): Secondary | ICD-10-CM | POA: Diagnosis not present

## 2018-04-25 DIAGNOSIS — Z9989 Dependence on other enabling machines and devices: Secondary | ICD-10-CM | POA: Diagnosis not present

## 2018-04-30 DIAGNOSIS — Z942 Lung transplant status: Secondary | ICD-10-CM | POA: Diagnosis not present

## 2018-04-30 DIAGNOSIS — Z79899 Other long term (current) drug therapy: Secondary | ICD-10-CM | POA: Diagnosis not present

## 2018-04-30 DIAGNOSIS — J449 Chronic obstructive pulmonary disease, unspecified: Secondary | ICD-10-CM | POA: Diagnosis not present

## 2018-04-30 DIAGNOSIS — G4733 Obstructive sleep apnea (adult) (pediatric): Secondary | ICD-10-CM | POA: Diagnosis not present

## 2018-04-30 DIAGNOSIS — Z5181 Encounter for therapeutic drug level monitoring: Secondary | ICD-10-CM | POA: Diagnosis not present

## 2018-04-30 DIAGNOSIS — N400 Enlarged prostate without lower urinary tract symptoms: Secondary | ICD-10-CM | POA: Diagnosis not present

## 2018-04-30 DIAGNOSIS — E669 Obesity, unspecified: Secondary | ICD-10-CM | POA: Diagnosis not present

## 2018-04-30 DIAGNOSIS — T86818 Other complications of lung transplant: Secondary | ICD-10-CM | POA: Diagnosis not present

## 2018-04-30 DIAGNOSIS — R197 Diarrhea, unspecified: Secondary | ICD-10-CM | POA: Diagnosis not present

## 2018-04-30 DIAGNOSIS — I129 Hypertensive chronic kidney disease with stage 1 through stage 4 chronic kidney disease, or unspecified chronic kidney disease: Secondary | ICD-10-CM | POA: Diagnosis not present

## 2018-04-30 DIAGNOSIS — Z6832 Body mass index (BMI) 32.0-32.9, adult: Secondary | ICD-10-CM | POA: Diagnosis not present

## 2018-04-30 DIAGNOSIS — E119 Type 2 diabetes mellitus without complications: Secondary | ICD-10-CM | POA: Diagnosis not present

## 2018-04-30 DIAGNOSIS — K219 Gastro-esophageal reflux disease without esophagitis: Secondary | ICD-10-CM | POA: Diagnosis not present

## 2018-04-30 DIAGNOSIS — J9811 Atelectasis: Secondary | ICD-10-CM | POA: Diagnosis not present

## 2018-04-30 DIAGNOSIS — J42 Unspecified chronic bronchitis: Secondary | ICD-10-CM | POA: Diagnosis not present

## 2018-04-30 DIAGNOSIS — D72819 Decreased white blood cell count, unspecified: Secondary | ICD-10-CM | POA: Diagnosis not present

## 2018-04-30 DIAGNOSIS — E785 Hyperlipidemia, unspecified: Secondary | ICD-10-CM | POA: Diagnosis not present

## 2018-04-30 DIAGNOSIS — N183 Chronic kidney disease, stage 3 (moderate): Secondary | ICD-10-CM | POA: Diagnosis not present

## 2018-04-30 DIAGNOSIS — D899 Disorder involving the immune mechanism, unspecified: Secondary | ICD-10-CM | POA: Diagnosis not present

## 2018-05-01 DIAGNOSIS — Z79899 Other long term (current) drug therapy: Secondary | ICD-10-CM | POA: Diagnosis not present

## 2018-05-01 DIAGNOSIS — Z942 Lung transplant status: Secondary | ICD-10-CM | POA: Diagnosis not present

## 2018-05-01 DIAGNOSIS — T86818 Other complications of lung transplant: Secondary | ICD-10-CM | POA: Diagnosis not present

## 2018-05-01 DIAGNOSIS — Z5181 Encounter for therapeutic drug level monitoring: Secondary | ICD-10-CM | POA: Diagnosis not present

## 2018-05-01 DIAGNOSIS — T86810 Lung transplant rejection: Secondary | ICD-10-CM | POA: Diagnosis not present

## 2018-05-01 DIAGNOSIS — Y830 Surgical operation with transplant of whole organ as the cause of abnormal reaction of the patient, or of later complication, without mention of misadventure at the time of the procedure: Secondary | ICD-10-CM | POA: Diagnosis not present

## 2018-05-10 DIAGNOSIS — Z006 Encounter for examination for normal comparison and control in clinical research program: Secondary | ICD-10-CM | POA: Diagnosis not present

## 2018-05-10 DIAGNOSIS — Z79899 Other long term (current) drug therapy: Secondary | ICD-10-CM | POA: Diagnosis not present

## 2018-05-10 DIAGNOSIS — J449 Chronic obstructive pulmonary disease, unspecified: Secondary | ICD-10-CM | POA: Diagnosis not present

## 2018-05-10 DIAGNOSIS — Z942 Lung transplant status: Secondary | ICD-10-CM | POA: Diagnosis not present

## 2018-05-10 DIAGNOSIS — T86818 Other complications of lung transplant: Secondary | ICD-10-CM | POA: Diagnosis not present

## 2018-05-10 DIAGNOSIS — T86810 Lung transplant rejection: Secondary | ICD-10-CM | POA: Diagnosis not present

## 2018-05-10 DIAGNOSIS — Z5181 Encounter for therapeutic drug level monitoring: Secondary | ICD-10-CM | POA: Diagnosis not present

## 2018-05-11 DIAGNOSIS — T86810 Lung transplant rejection: Secondary | ICD-10-CM | POA: Diagnosis not present

## 2018-05-11 DIAGNOSIS — Z006 Encounter for examination for normal comparison and control in clinical research program: Secondary | ICD-10-CM | POA: Diagnosis not present

## 2018-05-17 DIAGNOSIS — T86810 Lung transplant rejection: Secondary | ICD-10-CM | POA: Diagnosis not present

## 2018-05-17 DIAGNOSIS — Z006 Encounter for examination for normal comparison and control in clinical research program: Secondary | ICD-10-CM | POA: Diagnosis not present

## 2018-05-18 DIAGNOSIS — T86810 Lung transplant rejection: Secondary | ICD-10-CM | POA: Diagnosis not present

## 2018-05-18 DIAGNOSIS — Z006 Encounter for examination for normal comparison and control in clinical research program: Secondary | ICD-10-CM | POA: Diagnosis not present

## 2018-05-28 DIAGNOSIS — T86810 Lung transplant rejection: Secondary | ICD-10-CM | POA: Diagnosis not present

## 2018-05-28 DIAGNOSIS — Z942 Lung transplant status: Secondary | ICD-10-CM | POA: Diagnosis not present

## 2018-05-28 DIAGNOSIS — Z006 Encounter for examination for normal comparison and control in clinical research program: Secondary | ICD-10-CM | POA: Diagnosis not present

## 2018-06-01 DIAGNOSIS — Z006 Encounter for examination for normal comparison and control in clinical research program: Secondary | ICD-10-CM | POA: Diagnosis not present

## 2018-06-01 DIAGNOSIS — T86810 Lung transplant rejection: Secondary | ICD-10-CM | POA: Diagnosis not present

## 2018-06-04 DIAGNOSIS — T86810 Lung transplant rejection: Secondary | ICD-10-CM | POA: Diagnosis not present

## 2018-06-04 DIAGNOSIS — Z006 Encounter for examination for normal comparison and control in clinical research program: Secondary | ICD-10-CM | POA: Diagnosis not present

## 2018-06-04 DIAGNOSIS — J42 Unspecified chronic bronchitis: Secondary | ICD-10-CM | POA: Diagnosis not present

## 2018-06-05 DIAGNOSIS — E669 Obesity, unspecified: Secondary | ICD-10-CM | POA: Diagnosis not present

## 2018-06-05 DIAGNOSIS — G4733 Obstructive sleep apnea (adult) (pediatric): Secondary | ICD-10-CM | POA: Diagnosis not present

## 2018-06-05 DIAGNOSIS — N183 Chronic kidney disease, stage 3 (moderate): Secondary | ICD-10-CM | POA: Diagnosis not present

## 2018-06-05 DIAGNOSIS — N400 Enlarged prostate without lower urinary tract symptoms: Secondary | ICD-10-CM | POA: Diagnosis not present

## 2018-06-05 DIAGNOSIS — R197 Diarrhea, unspecified: Secondary | ICD-10-CM | POA: Diagnosis not present

## 2018-06-05 DIAGNOSIS — E1122 Type 2 diabetes mellitus with diabetic chronic kidney disease: Secondary | ICD-10-CM | POA: Diagnosis not present

## 2018-06-05 DIAGNOSIS — T86818 Other complications of lung transplant: Secondary | ICD-10-CM | POA: Diagnosis not present

## 2018-06-05 DIAGNOSIS — I129 Hypertensive chronic kidney disease with stage 1 through stage 4 chronic kidney disease, or unspecified chronic kidney disease: Secondary | ICD-10-CM | POA: Diagnosis not present

## 2018-06-05 DIAGNOSIS — Z942 Lung transplant status: Secondary | ICD-10-CM | POA: Diagnosis not present

## 2018-06-05 DIAGNOSIS — J42 Unspecified chronic bronchitis: Secondary | ICD-10-CM | POA: Diagnosis not present

## 2018-06-05 DIAGNOSIS — Z5181 Encounter for therapeutic drug level monitoring: Secondary | ICD-10-CM | POA: Diagnosis not present

## 2018-06-05 DIAGNOSIS — D72819 Decreased white blood cell count, unspecified: Secondary | ICD-10-CM | POA: Diagnosis not present

## 2018-06-05 DIAGNOSIS — J449 Chronic obstructive pulmonary disease, unspecified: Secondary | ICD-10-CM | POA: Diagnosis not present

## 2018-06-05 DIAGNOSIS — Z9989 Dependence on other enabling machines and devices: Secondary | ICD-10-CM | POA: Diagnosis not present

## 2018-06-05 DIAGNOSIS — Z4824 Encounter for aftercare following lung transplant: Secondary | ICD-10-CM | POA: Diagnosis not present

## 2018-06-05 DIAGNOSIS — K219 Gastro-esophageal reflux disease without esophagitis: Secondary | ICD-10-CM | POA: Diagnosis not present

## 2018-06-05 DIAGNOSIS — E785 Hyperlipidemia, unspecified: Secondary | ICD-10-CM | POA: Diagnosis not present

## 2018-06-05 DIAGNOSIS — Z79899 Other long term (current) drug therapy: Secondary | ICD-10-CM | POA: Diagnosis not present

## 2018-06-05 DIAGNOSIS — R918 Other nonspecific abnormal finding of lung field: Secondary | ICD-10-CM | POA: Diagnosis not present

## 2018-06-05 DIAGNOSIS — D899 Disorder involving the immune mechanism, unspecified: Secondary | ICD-10-CM | POA: Diagnosis not present

## 2018-06-06 DIAGNOSIS — T86810 Lung transplant rejection: Secondary | ICD-10-CM | POA: Diagnosis not present

## 2018-06-06 DIAGNOSIS — Z006 Encounter for examination for normal comparison and control in clinical research program: Secondary | ICD-10-CM | POA: Diagnosis not present

## 2018-06-11 DIAGNOSIS — J988 Other specified respiratory disorders: Secondary | ICD-10-CM | POA: Diagnosis not present

## 2018-06-11 DIAGNOSIS — R5383 Other fatigue: Secondary | ICD-10-CM | POA: Diagnosis not present

## 2018-06-11 DIAGNOSIS — Z942 Lung transplant status: Secondary | ICD-10-CM | POA: Diagnosis not present

## 2018-06-11 DIAGNOSIS — T86810 Lung transplant rejection: Secondary | ICD-10-CM | POA: Diagnosis not present

## 2018-06-11 DIAGNOSIS — R0600 Dyspnea, unspecified: Secondary | ICD-10-CM | POA: Diagnosis not present

## 2018-06-11 DIAGNOSIS — R0602 Shortness of breath: Secondary | ICD-10-CM | POA: Diagnosis not present

## 2018-06-11 DIAGNOSIS — Z5181 Encounter for therapeutic drug level monitoring: Secondary | ICD-10-CM | POA: Diagnosis not present

## 2018-06-11 DIAGNOSIS — Z006 Encounter for examination for normal comparison and control in clinical research program: Secondary | ICD-10-CM | POA: Diagnosis not present

## 2018-06-11 DIAGNOSIS — T86818 Other complications of lung transplant: Secondary | ICD-10-CM | POA: Diagnosis not present

## 2018-06-12 DIAGNOSIS — T86810 Lung transplant rejection: Secondary | ICD-10-CM | POA: Diagnosis not present

## 2018-06-12 DIAGNOSIS — Z006 Encounter for examination for normal comparison and control in clinical research program: Secondary | ICD-10-CM | POA: Diagnosis not present

## 2018-06-12 DIAGNOSIS — J42 Unspecified chronic bronchitis: Secondary | ICD-10-CM | POA: Diagnosis not present

## 2018-06-25 DIAGNOSIS — T86810 Lung transplant rejection: Secondary | ICD-10-CM | POA: Diagnosis not present

## 2018-06-25 DIAGNOSIS — J42 Unspecified chronic bronchitis: Secondary | ICD-10-CM | POA: Diagnosis not present

## 2018-06-25 DIAGNOSIS — Z006 Encounter for examination for normal comparison and control in clinical research program: Secondary | ICD-10-CM | POA: Diagnosis not present

## 2018-06-26 DIAGNOSIS — T86810 Lung transplant rejection: Secondary | ICD-10-CM | POA: Diagnosis not present

## 2018-06-26 DIAGNOSIS — Z006 Encounter for examination for normal comparison and control in clinical research program: Secondary | ICD-10-CM | POA: Diagnosis not present

## 2018-06-26 DIAGNOSIS — J42 Unspecified chronic bronchitis: Secondary | ICD-10-CM | POA: Diagnosis not present

## 2018-07-03 DIAGNOSIS — T86810 Lung transplant rejection: Secondary | ICD-10-CM | POA: Diagnosis not present

## 2018-07-09 DIAGNOSIS — T86810 Lung transplant rejection: Secondary | ICD-10-CM | POA: Diagnosis not present

## 2018-07-09 DIAGNOSIS — Z006 Encounter for examination for normal comparison and control in clinical research program: Secondary | ICD-10-CM | POA: Diagnosis not present

## 2018-07-10 DIAGNOSIS — T86810 Lung transplant rejection: Secondary | ICD-10-CM | POA: Diagnosis not present

## 2018-07-10 DIAGNOSIS — Z006 Encounter for examination for normal comparison and control in clinical research program: Secondary | ICD-10-CM | POA: Diagnosis not present

## 2018-07-17 DIAGNOSIS — Z006 Encounter for examination for normal comparison and control in clinical research program: Secondary | ICD-10-CM | POA: Diagnosis not present

## 2018-07-17 DIAGNOSIS — Z5181 Encounter for therapeutic drug level monitoring: Secondary | ICD-10-CM | POA: Diagnosis not present

## 2018-07-17 DIAGNOSIS — Z942 Lung transplant status: Secondary | ICD-10-CM | POA: Diagnosis not present

## 2018-07-17 DIAGNOSIS — Z79899 Other long term (current) drug therapy: Secondary | ICD-10-CM | POA: Diagnosis not present

## 2018-07-17 DIAGNOSIS — R197 Diarrhea, unspecified: Secondary | ICD-10-CM | POA: Diagnosis not present

## 2018-07-17 DIAGNOSIS — D899 Disorder involving the immune mechanism, unspecified: Secondary | ICD-10-CM | POA: Diagnosis not present

## 2018-07-17 DIAGNOSIS — R918 Other nonspecific abnormal finding of lung field: Secondary | ICD-10-CM | POA: Diagnosis not present

## 2018-07-17 DIAGNOSIS — T86818 Other complications of lung transplant: Secondary | ICD-10-CM | POA: Diagnosis not present

## 2018-07-17 DIAGNOSIS — Z85828 Personal history of other malignant neoplasm of skin: Secondary | ICD-10-CM | POA: Diagnosis not present

## 2018-07-17 DIAGNOSIS — R9389 Abnormal findings on diagnostic imaging of other specified body structures: Secondary | ICD-10-CM | POA: Diagnosis not present

## 2018-07-23 DIAGNOSIS — D649 Anemia, unspecified: Secondary | ICD-10-CM | POA: Diagnosis not present

## 2018-07-23 DIAGNOSIS — Z006 Encounter for examination for normal comparison and control in clinical research program: Secondary | ICD-10-CM | POA: Diagnosis not present

## 2018-07-23 DIAGNOSIS — I959 Hypotension, unspecified: Secondary | ICD-10-CM | POA: Diagnosis not present

## 2018-07-23 DIAGNOSIS — T86810 Lung transplant rejection: Secondary | ICD-10-CM | POA: Diagnosis not present

## 2018-07-24 DIAGNOSIS — Z006 Encounter for examination for normal comparison and control in clinical research program: Secondary | ICD-10-CM | POA: Diagnosis not present

## 2018-07-24 DIAGNOSIS — J42 Unspecified chronic bronchitis: Secondary | ICD-10-CM | POA: Diagnosis not present

## 2018-07-24 DIAGNOSIS — T86810 Lung transplant rejection: Secondary | ICD-10-CM | POA: Diagnosis not present

## 2018-08-06 DIAGNOSIS — Z5181 Encounter for therapeutic drug level monitoring: Secondary | ICD-10-CM | POA: Diagnosis not present

## 2018-08-06 DIAGNOSIS — T86810 Lung transplant rejection: Secondary | ICD-10-CM | POA: Diagnosis not present

## 2018-08-06 DIAGNOSIS — Z79899 Other long term (current) drug therapy: Secondary | ICD-10-CM | POA: Diagnosis not present

## 2018-08-06 DIAGNOSIS — Z006 Encounter for examination for normal comparison and control in clinical research program: Secondary | ICD-10-CM | POA: Diagnosis not present

## 2018-08-06 DIAGNOSIS — Z942 Lung transplant status: Secondary | ICD-10-CM | POA: Diagnosis not present

## 2018-08-15 DIAGNOSIS — T86810 Lung transplant rejection: Secondary | ICD-10-CM | POA: Diagnosis not present

## 2018-08-15 DIAGNOSIS — J42 Unspecified chronic bronchitis: Secondary | ICD-10-CM | POA: Diagnosis not present

## 2018-08-15 DIAGNOSIS — Z006 Encounter for examination for normal comparison and control in clinical research program: Secondary | ICD-10-CM | POA: Diagnosis not present

## 2018-08-16 DIAGNOSIS — T86810 Lung transplant rejection: Secondary | ICD-10-CM | POA: Diagnosis not present

## 2018-08-16 DIAGNOSIS — R7989 Other specified abnormal findings of blood chemistry: Secondary | ICD-10-CM | POA: Diagnosis not present

## 2018-08-16 DIAGNOSIS — Z006 Encounter for examination for normal comparison and control in clinical research program: Secondary | ICD-10-CM | POA: Diagnosis not present

## 2018-08-17 ENCOUNTER — Ambulatory Visit: Payer: Self-pay | Admitting: Adult Health

## 2018-08-17 DIAGNOSIS — D509 Iron deficiency anemia, unspecified: Secondary | ICD-10-CM | POA: Insufficient documentation

## 2018-08-28 DIAGNOSIS — Z9109 Other allergy status, other than to drugs and biological substances: Secondary | ICD-10-CM | POA: Diagnosis not present

## 2018-08-28 DIAGNOSIS — Z942 Lung transplant status: Secondary | ICD-10-CM | POA: Diagnosis not present

## 2018-08-31 DIAGNOSIS — T86810 Lung transplant rejection: Secondary | ICD-10-CM | POA: Diagnosis not present

## 2018-08-31 DIAGNOSIS — D509 Iron deficiency anemia, unspecified: Secondary | ICD-10-CM | POA: Diagnosis not present

## 2018-09-03 DIAGNOSIS — D509 Iron deficiency anemia, unspecified: Secondary | ICD-10-CM | POA: Diagnosis not present

## 2018-09-03 DIAGNOSIS — Z006 Encounter for examination for normal comparison and control in clinical research program: Secondary | ICD-10-CM | POA: Diagnosis not present

## 2018-09-03 DIAGNOSIS — T86810 Lung transplant rejection: Secondary | ICD-10-CM | POA: Diagnosis not present

## 2018-09-04 DIAGNOSIS — D509 Iron deficiency anemia, unspecified: Secondary | ICD-10-CM | POA: Diagnosis not present

## 2018-09-04 DIAGNOSIS — T86810 Lung transplant rejection: Secondary | ICD-10-CM | POA: Diagnosis not present

## 2018-09-04 DIAGNOSIS — Z006 Encounter for examination for normal comparison and control in clinical research program: Secondary | ICD-10-CM | POA: Diagnosis not present

## 2018-09-07 DIAGNOSIS — Z5181 Encounter for therapeutic drug level monitoring: Secondary | ICD-10-CM | POA: Diagnosis not present

## 2018-09-07 DIAGNOSIS — Z942 Lung transplant status: Secondary | ICD-10-CM | POA: Diagnosis not present

## 2018-09-07 DIAGNOSIS — T86818 Other complications of lung transplant: Secondary | ICD-10-CM | POA: Diagnosis not present

## 2018-09-07 DIAGNOSIS — D509 Iron deficiency anemia, unspecified: Secondary | ICD-10-CM | POA: Diagnosis not present

## 2018-09-07 DIAGNOSIS — T86810 Lung transplant rejection: Secondary | ICD-10-CM | POA: Diagnosis not present

## 2018-09-07 DIAGNOSIS — Z006 Encounter for examination for normal comparison and control in clinical research program: Secondary | ICD-10-CM | POA: Diagnosis not present

## 2018-09-24 ENCOUNTER — Ambulatory Visit (INDEPENDENT_AMBULATORY_CARE_PROVIDER_SITE_OTHER): Payer: Medicare Other | Admitting: Adult Health

## 2018-09-24 ENCOUNTER — Other Ambulatory Visit: Payer: Self-pay

## 2018-09-24 ENCOUNTER — Encounter: Payer: Self-pay | Admitting: Adult Health

## 2018-09-24 VITALS — BP 138/90 | HR 89 | Resp 16 | Ht 69.0 in | Wt 179.0 lb

## 2018-09-24 DIAGNOSIS — I10 Essential (primary) hypertension: Secondary | ICD-10-CM | POA: Diagnosis not present

## 2018-09-24 DIAGNOSIS — E1165 Type 2 diabetes mellitus with hyperglycemia: Secondary | ICD-10-CM

## 2018-09-24 DIAGNOSIS — Z942 Lung transplant status: Secondary | ICD-10-CM | POA: Diagnosis not present

## 2018-09-24 DIAGNOSIS — E785 Hyperlipidemia, unspecified: Secondary | ICD-10-CM

## 2018-09-24 LAB — POCT GLYCOSYLATED HEMOGLOBIN (HGB A1C): Hemoglobin A1C: 7.5 % — AB (ref 4.0–5.6)

## 2018-09-24 MED ORDER — GLUCOSE BLOOD VI STRP: ORAL_STRIP | 2 refills | Status: DC

## 2018-09-24 NOTE — Progress Notes (Signed)
[facility] [place], [place] [address]  Internal MEDICINE  Office Visit Note  Patient Name: Caleb [name]  [id number]  [id number]  Date of Service: 09/24/2018  Chief Complaint  Patient presents with  . Diabetes  . Hyperlipidemia  . Hypertension    HPI  PT is here for follow up on diabetes, HLD, and HTN. Pt reports he is being seen by heme/On[facility].  He reports continued issues with his lung after they punctured it during a bronchoscopy.  He reports he has been told he had approximately 6 months to live. Which was 4 months ago.  He has been doing infusions, and some plasmapheresis treatments to help him stave off rejection of his lung transplant.  Overall he reports he is doing well. His HTN, HLD and DM seem controlled.  He reports his blood sugar has been around 95-130 each morning. His A1C is improved from 8.0 to 7.5 today.    Current Medication: Outpatient Encounter Medications as of 09/24/2018  Medication Sig  . acetaminophen (TYLENOL) 325 MG tablet Take 650 mg by mouth every 6 (six) hours as needed.  Marland Kitchen albuterol (PROVENTIL) (2.5 MG/3ML) 0.083% nebulizer solution Inhale into the lungs.  Marland Kitchen albuterol (VENTOLIN HFA) 108 (90 Base) MCG/ACT inhaler Inhale into the lungs.  Marland Kitchen aspirin 81 MG tablet Take 81 mg by mouth daily.  Marland Kitchen azithromycin (ZITHROMAX) 250 MG tablet Take by mouth daily.  . budesonide-formoterol (SYMBICORT) 160-4.5 MCG/ACT inhaler Inhale into the lungs.  . Cholecalciferol (VITAMIN D3) 1000 units CAPS Take by mouth.  . fexofenadine (ALLEGRA) 180 MG tablet Take 180 mg by mouth daily.  . furosemide (LASIX) 20 MG tablet Take 20 mg by mouth. One tablet by mouth three times a week Sunday , Tuesday , friday  . glipiZIDE (GLUCOTROL) 5 MG tablet TK 1 T PO BID B MEALS  . glucose blood (ONETOUCH VERIO) test strip TEST FOUR TIMES DAILY  . Immune Globulin 10% (GAMUNEX-C) 10 GM/100ML SOLN Inject into the vein. Inject 40 g into the vein monthly  .  Isavuconazonium Sulfate (CRESEMBA) 186 MG CAPS Take by mouth.  . Lancets 28G MISC Use as instructed QID  . lovastatin (MEVACOR) 20 MG tablet Take 20 mg by mouth daily.  . Melatonin (MELATONIN MAXIMUM STRENGTH) 5 MG TABS Take by mouth.  . metoprolol tartrate (LOPRESSOR) 25 MG tablet TK 1/2 T PO BID  . Multiple Vitamin (MULTI-VITAMINS) TABS Take by mouth.  . mycophenolate (CELLCEPT) 250 MG capsule Take by mouth 2 (two) times daily. Take four capsules by mouth every twelve hours  . pantoprazole (PROTONIX) 40 MG tablet Take 40 mg by mouth 2 (two) times daily.   . predniSONE (DELTASONE) 5 MG tablet TK 1 T PO  QD  . Sirolimus (RAPAMUNE) 0.5 MG tablet Take by mouth.  . sodium chloride HYPERTONIC 3 % nebulizer solution Inhale 48mls via nebulizer twice daily, following albuterol neb.  . tacrolimus (PROGRAF) 0.5 MG capsule 02/01/18: Take in combination with 1 mg tablets for a total of 1 mg by mouth in the morning, and 0.5 mg in the evening.  . valGANciclovir (VALCYTE) 450 MG tablet Take 450 mg by mouth every other day.  . tacrolimus (PROGRAF) 1 MG capsule 02/01/18: Take in combination with 0.5 mg tablets for a total of 1 mg by mouth in the morning, and 0.5 mg in the evening.  . tamsulosin (FLOMAX) 0.4 MG CAPS Take 0.4 mg by mouth daily after breakfast.  . [DISCONTINUED] calcium citrate-vitamin D (CITRACAL+D) 315-200  MG-UNIT tablet Take 1 tablet by mouth 2 (two) times daily.  . [DISCONTINUED] dutasteride (AVODART) 0.5 MG capsule Take 0.5 mg by mouth daily.  . [DISCONTINUED] enalapril (VASOTEC) 20 MG tablet Take 20 mg by mouth daily.   No facility-administered encounter medications on file as of 09/24/2018.     Surgical History: Past Surgical History:  Procedure Laterality Date  . LUNG TRANSPLANT      Medical History: Past Medical History:  Diagnosis Date  . BPH (benign prostatic hyperplasia)   . Chest pain, unspecified   . Diabetes mellitus without complication ([place])   . Dyspnea   . Hyperlipidemia    . Hypertension     Family History: Family History  Problem Relation Age of Onset  . Coronary artery disease Other   . Diabetes Other     Social History   Socioeconomic History  . Marital status: Married    Spouse name: Not on file  . Number of children: Not on file  . Years of education: Not on file  . Highest education level: Not on file  Occupational History  . Not on file  Social Needs  . Financial resource strain: Not on file  . Food insecurity:    Worry: Not on file    Inability: Not on file  . Transportation needs:    Medical: Not on file    Non-medical: Not on file  Tobacco Use  . Smoking status: Former Smoker    Types: Cigarettes    Last attempt to quit: 05/16/1979    Years since quitting: 39.3  . Smokeless tobacco: Never Used  . Tobacco comment: Quit 1980  Substance and Sexual Activity  . Alcohol use: Not Currently  . Drug use: No  . Sexual activity: Not on file  Lifestyle  . Physical activity:    Days per week: Not on file    Minutes per session: Not on file  . Stress: Not on file  Relationships  . Social connections:    Talks on phone: Not on file    Gets together: Not on file    Attends religious service: Not on file    Active member of club or organization: Not on file    Attends meetings of clubs or organizations: Not on file    Relationship status: Not on file  . Intimate partner violence:    Fear of current or ex partner: Not on file    Emotionally abused: Not on file    Physically abused: Not on file    Forced sexual activity: Not on file  Other Topics Concern  . 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       Review of Systems  Constitutional: Negative.  Negative for chills, fatigue and unexpected weight change.  HENT: Negative.  Negative for congestion, rhinorrhea, sneezing and sore throat.   Eyes: Negative for redness.  Respiratory: Negative.  Negative for cough, chest  tightness and shortness of breath.   Cardiovascular: Negative.  Negative for chest pain and palpitations.  Gastrointestinal: Negative.  Negative for abdominal pain, constipation, diarrhea, nausea and vomiting.  Endocrine: Negative.   Genitourinary: Negative.  Negative for dysuria and frequency.  Musculoskeletal: Negative.  Negative for arthralgias, back pain, joint swelling and neck pain.  Skin: Negative.  Negative for rash.  Allergic/Immunologic: Negative.   Neurological: Negative.  Negative for tremors and numbness.  Hematological: Negative for adenopathy. Does not bruise/bleed easily.  Psychiatric/Behavioral: Negative.  Negative for behavioral problems, sleep disturbance and suicidal ideas. The patient is not nervous/anxious.     Vital Signs: BP 138/90   Pulse 89   Resp 16   Ht 5\' 9"  (1.753 m)   Wt 179 lb (81.2 kg)   SpO2 97%   BMI 26.43 kg/m    Physical Exam Vitals signs and nursing note reviewed.  Constitutional:      General: He is not in acute distress.    Appearance: He is well-developed. He is not diaphoretic.  HENT:     Head: Normocephalic and atraumatic.     Mouth/Throat:     Pharynx: No oropharyngeal exudate.  Eyes:     Pupils: Pupils are equal, round, and reactive to light.  Neck:     Musculoskeletal: Normal range of motion and neck supple.     Thyroid: No thyromegaly.     Vascular: No JVD.     Trachea: No tracheal deviation.  Cardiovascular:     Rate and Rhythm: Normal rate and regular rhythm.     Heart sounds: Normal heart sounds. No murmur. No friction rub. No gallop.   Pulmonary:     Effort: Pulmonary effort is normal. No respiratory distress.     Breath sounds: Normal breath sounds. No wheezing or rales.  Chest:     Chest wall: No tenderness.  Abdominal:     Palpations: Abdomen is soft.     Tenderness: There is no abdominal tenderness. There is no guarding.  Musculoskeletal: Normal range of motion.  Lymphadenopathy:     Cervical: No cervical  adenopathy.  Skin:    General: Skin is warm and dry.  Neurological:     Mental Status: He is alert and oriented to person, place, and time.     Cranial Nerves: No cranial nerve deficit.  Psychiatric:        Behavior: Behavior normal.        Thought Content: Thought content normal.        Judgment: Judgment normal.    Assessment/Plan: 1. Uncontrolled type 2 diabetes mellitus with hyperglycemia (HCC) Todays A1C is 7.5.  Pt in need of test strips which will be sent.   - POCT HgB A1C - Microalbumin, urine  2. Essential hypertension Stable, continue present mangement  3. H/O lung transplant ([place]) Continue to follow up with [facility].   4. Hyperlipidemia, unspecified hyperlipidemia type Stable, will recheck lipid panel at next physical.    General Counseling: Lisbeth Ply understanding of the findings of todays visit and agrees with plan of treatment. I have discussed any further diagnostic evaluation that may be needed or ordered today. We also reviewed his medications today. he has been encouraged to call the office with any questions or concerns that should arise related to todays visit.    Orders Placed This Encounter  Procedures  . Microalbumin, urine  . POCT HgB A1C    No orders of the defined types were placed in this encounter.   Time spent: 25 Minutes   This patient was seen by Orson Gear AGNP-C in Collaboration with Dr Lavera Guise as a part of collaborative care agreement     Kendell Bane AGNP-C Internal medicine

## 2018-09-24 NOTE — Patient Instructions (Signed)
Diabetes Mellitus and Nutrition, Adult  When you have diabetes (diabetes mellitus), it is very important to have healthy eating habits because your blood sugar (glucose) levels are greatly affected by what you eat and drink. Eating healthy foods in the appropriate amounts, at about the same times every day, can help you:  · Control your blood glucose.  · Lower your risk of heart disease.  · Improve your blood pressure.  · Reach or maintain a healthy weight.  Every person with diabetes is different, and each person has different needs for a meal plan. Your health care provider may recommend that you work with a diet and nutrition specialist (dietitian) to make a meal plan that is best for you. Your meal plan may vary depending on factors such as:  · The calories you need.  · The medicines you take.  · Your weight.  · Your blood glucose, blood pressure, and cholesterol levels.  · Your activity level.  · Other health conditions you have, such as heart or kidney disease.  How do carbohydrates affect me?  Carbohydrates, also called carbs, affect your blood glucose level more than any other type of food. Eating carbs naturally raises the amount of glucose in your blood. Carb counting is a method for keeping track of how many carbs you eat. Counting carbs is important to keep your blood glucose at a healthy level, especially if you use insulin or take certain oral diabetes medicines.  It is important to know how many carbs you can safely have in each meal. This is different for every person. Your dietitian can help you calculate how many carbs you should have at each meal and for each snack.  Foods that contain carbs include:  · Bread, cereal, rice, pasta, and crackers.  · Potatoes and corn.  · Peas, beans, and lentils.  · Milk and yogurt.  · Fruit and juice.  · Desserts, such as cakes, cookies, ice cream, and candy.  How does alcohol affect me?  Alcohol can cause a sudden decrease in blood glucose (hypoglycemia),  especially if you use insulin or take certain oral diabetes medicines. Hypoglycemia can be a life-threatening condition. Symptoms of hypoglycemia (sleepiness, dizziness, and confusion) are similar to symptoms of having too much alcohol.  If your health care provider says that alcohol is safe for you, follow these guidelines:  · Limit alcohol intake to no more than 1 drink per day for nonpregnant women and 2 drinks per day for men. One drink equals 12 oz of beer, 5 oz of wine, or 1½ oz of hard liquor.  · Do not drink on an empty stomach.  · Keep yourself hydrated with water, diet soda, or unsweetened iced tea.  · Keep in mind that regular soda, juice, and other mixers may contain a lot of sugar and must be counted as carbs.  What are tips for following this plan?    Reading food labels  · Start by checking the serving size on the "Nutrition Facts" label of packaged foods and drinks. The amount of calories, carbs, fats, and other nutrients listed on the label is based on one serving of the item. Many items contain more than one serving per package.  · Check the total grams (g) of carbs in one serving. You can calculate the number of servings of carbs in one serving by dividing the total carbs by 15. For example, if a food has 30 g of total carbs, it would be equal to 2   servings of carbs.  · Check the number of grams (g) of saturated and trans fats in one serving. Choose foods that have low or no amount of these fats.  · Check the number of milligrams (mg) of salt (sodium) in one serving. Most people should limit total sodium intake to less than 2,300 mg per day.  · Always check the nutrition information of foods labeled as "low-fat" or "nonfat". These foods may be higher in added sugar or refined carbs and should be avoided.  · Talk to your dietitian to identify your daily goals for nutrients listed on the label.  Shopping  · Avoid buying canned, premade, or processed foods. These foods tend to be high in fat, sodium,  and added sugar.  · Shop around the outside edge of the grocery store. This includes fresh fruits and vegetables, bulk grains, fresh meats, and fresh dairy.  Cooking  · Use low-heat cooking methods, such as baking, instead of high-heat cooking methods like deep frying.  · Cook using healthy oils, such as olive, canola, or sunflower oil.  · Avoid cooking with butter, cream, or high-fat meats.  Meal planning  · Eat meals and snacks regularly, preferably at the same times every day. Avoid going long periods of time without eating.  · Eat foods high in fiber, such as fresh fruits, vegetables, beans, and whole grains. Talk to your dietitian about how many servings of carbs you can eat at each meal.  · Eat 4-6 ounces (oz) of lean protein each day, such as lean meat, chicken, fish, eggs, or tofu. One oz of lean protein is equal to:  ? 1 oz of meat, chicken, or fish.  ? 1 egg.  ? ¼ cup of tofu.  · Eat some foods each day that contain healthy fats, such as avocado, nuts, seeds, and fish.  Lifestyle  · Check your blood glucose regularly.  · Exercise regularly as told by your health care provider. This may include:  ? 150 minutes of moderate-intensity or vigorous-intensity exercise each week. This could be brisk walking, biking, or water aerobics.  ? Stretching and doing strength exercises, such as yoga or weightlifting, at least 2 times a week.  · Take medicines as told by your health care provider.  · Do not use any products that contain nicotine or tobacco, such as cigarettes and e-cigarettes. If you need help quitting, ask your health care provider.  · Work with a counselor or diabetes educator to identify strategies to manage stress and any emotional and social challenges.  Questions to ask a health care provider  · Do I need to meet with a diabetes educator?  · Do I need to meet with a dietitian?  · What number can I call if I have questions?  · When are the best times to check my blood glucose?  Where to find more  information:  · American Diabetes Association: diabetes.org  · Academy of Nutrition and Dietetics: www.eatright.org  · National Institute of Diabetes and Digestive and Kidney Diseases (NIH): www.niddk.nih.gov  Summary  · A healthy meal plan will help you control your blood glucose and maintain a healthy lifestyle.  · Working with a diet and nutrition specialist (dietitian) can help you make a meal plan that is best for you.  · Keep in mind that carbohydrates (carbs) and alcohol have immediate effects on your blood glucose levels. It is important to count carbs and to use alcohol carefully.  This information is not intended to   replace advice given to you by your health care provider. Make sure you discuss any questions you have with your health care provider.  Document Released: 02/10/2005 Document Revised: 12/14/2016 Document Reviewed: 06/20/2016  Elsevier Interactive Patient Education © 2019 [employer]

## 2018-09-25 DIAGNOSIS — Z942 Lung transplant status: Secondary | ICD-10-CM | POA: Diagnosis not present

## 2018-09-25 DIAGNOSIS — R351 Nocturia: Secondary | ICD-10-CM | POA: Diagnosis not present

## 2018-09-25 DIAGNOSIS — Z5181 Encounter for therapeutic drug level monitoring: Secondary | ICD-10-CM | POA: Diagnosis not present

## 2018-09-25 DIAGNOSIS — D899 Disorder involving the immune mechanism, unspecified: Secondary | ICD-10-CM | POA: Diagnosis not present

## 2018-09-25 LAB — MICROALBUMIN, URINE: Microalbumin, Urine: 34.2 ug/mL

## 2018-09-26 DIAGNOSIS — R351 Nocturia: Secondary | ICD-10-CM | POA: Insufficient documentation

## 2018-10-01 DIAGNOSIS — Z125 Encounter for screening for malignant neoplasm of prostate: Secondary | ICD-10-CM | POA: Diagnosis not present

## 2018-10-01 DIAGNOSIS — Z006 Encounter for examination for normal comparison and control in clinical research program: Secondary | ICD-10-CM | POA: Diagnosis not present

## 2018-10-01 DIAGNOSIS — D509 Iron deficiency anemia, unspecified: Secondary | ICD-10-CM | POA: Diagnosis not present

## 2018-10-01 DIAGNOSIS — Z942 Lung transplant status: Secondary | ICD-10-CM | POA: Diagnosis not present

## 2018-10-01 DIAGNOSIS — J42 Unspecified chronic bronchitis: Secondary | ICD-10-CM | POA: Diagnosis not present

## 2018-10-01 DIAGNOSIS — T86810 Lung transplant rejection: Secondary | ICD-10-CM | POA: Diagnosis not present

## 2018-10-02 ENCOUNTER — Other Ambulatory Visit: Payer: Self-pay | Admitting: Adult Health

## 2018-10-02 DIAGNOSIS — D89811 Chronic graft-versus-host disease: Secondary | ICD-10-CM | POA: Diagnosis not present

## 2018-10-02 DIAGNOSIS — Z006 Encounter for examination for normal comparison and control in clinical research program: Secondary | ICD-10-CM | POA: Diagnosis not present

## 2018-10-02 DIAGNOSIS — T86810 Lung transplant rejection: Secondary | ICD-10-CM | POA: Diagnosis not present

## 2018-10-02 DIAGNOSIS — J42 Unspecified chronic bronchitis: Secondary | ICD-10-CM | POA: Diagnosis not present

## 2018-10-02 MED ORDER — GLUCOSE BLOOD VI STRP: ORAL_STRIP | 2 refills | Status: DC

## 2018-11-01 DIAGNOSIS — T86810 Lung transplant rejection: Secondary | ICD-10-CM | POA: Diagnosis not present

## 2018-11-01 DIAGNOSIS — Z006 Encounter for examination for normal comparison and control in clinical research program: Secondary | ICD-10-CM | POA: Diagnosis not present

## 2018-11-01 DIAGNOSIS — J42 Unspecified chronic bronchitis: Secondary | ICD-10-CM | POA: Diagnosis not present

## 2018-11-02 DIAGNOSIS — Z942 Lung transplant status: Secondary | ICD-10-CM | POA: Diagnosis not present

## 2018-11-02 DIAGNOSIS — Z79899 Other long term (current) drug therapy: Secondary | ICD-10-CM | POA: Diagnosis not present

## 2018-11-02 DIAGNOSIS — Z5181 Encounter for therapeutic drug level monitoring: Secondary | ICD-10-CM | POA: Diagnosis not present

## 2018-11-02 DIAGNOSIS — Z006 Encounter for examination for normal comparison and control in clinical research program: Secondary | ICD-10-CM | POA: Diagnosis not present

## 2018-11-02 DIAGNOSIS — T86810 Lung transplant rejection: Secondary | ICD-10-CM | POA: Diagnosis not present

## 2018-11-02 DIAGNOSIS — J42 Unspecified chronic bronchitis: Secondary | ICD-10-CM | POA: Diagnosis not present

## 2018-11-02 DIAGNOSIS — D89811 Chronic graft-versus-host disease: Secondary | ICD-10-CM | POA: Diagnosis not present

## 2018-11-13 DIAGNOSIS — Z942 Lung transplant status: Secondary | ICD-10-CM | POA: Diagnosis not present

## 2018-11-29 DIAGNOSIS — T86810 Lung transplant rejection: Secondary | ICD-10-CM | POA: Diagnosis not present

## 2018-11-29 DIAGNOSIS — J42 Unspecified chronic bronchitis: Secondary | ICD-10-CM | POA: Diagnosis not present

## 2018-11-29 DIAGNOSIS — Z006 Encounter for examination for normal comparison and control in clinical research program: Secondary | ICD-10-CM | POA: Diagnosis not present

## 2018-11-30 DIAGNOSIS — T86810 Lung transplant rejection: Secondary | ICD-10-CM | POA: Diagnosis not present

## 2018-11-30 DIAGNOSIS — Z006 Encounter for examination for normal comparison and control in clinical research program: Secondary | ICD-10-CM | POA: Diagnosis not present

## 2018-12-21 DIAGNOSIS — E7849 Other hyperlipidemia: Secondary | ICD-10-CM | POA: Diagnosis not present

## 2018-12-21 DIAGNOSIS — M7989 Other specified soft tissue disorders: Secondary | ICD-10-CM | POA: Diagnosis not present

## 2018-12-21 DIAGNOSIS — Z79899 Other long term (current) drug therapy: Secondary | ICD-10-CM | POA: Diagnosis not present

## 2018-12-21 DIAGNOSIS — Z5181 Encounter for therapeutic drug level monitoring: Secondary | ICD-10-CM | POA: Diagnosis not present

## 2018-12-27 DIAGNOSIS — T86810 Lung transplant rejection: Secondary | ICD-10-CM | POA: Diagnosis not present

## 2018-12-27 DIAGNOSIS — Z006 Encounter for examination for normal comparison and control in clinical research program: Secondary | ICD-10-CM | POA: Diagnosis not present

## 2018-12-27 DIAGNOSIS — Z942 Lung transplant status: Secondary | ICD-10-CM | POA: Diagnosis not present

## 2018-12-27 DIAGNOSIS — Z5181 Encounter for therapeutic drug level monitoring: Secondary | ICD-10-CM | POA: Diagnosis not present

## 2018-12-27 DIAGNOSIS — J42 Unspecified chronic bronchitis: Secondary | ICD-10-CM | POA: Diagnosis not present

## 2018-12-27 DIAGNOSIS — Z79899 Other long term (current) drug therapy: Secondary | ICD-10-CM | POA: Diagnosis not present

## 2018-12-28 DIAGNOSIS — J42 Unspecified chronic bronchitis: Secondary | ICD-10-CM | POA: Diagnosis not present

## 2018-12-28 DIAGNOSIS — T86810 Lung transplant rejection: Secondary | ICD-10-CM | POA: Diagnosis not present

## 2018-12-28 DIAGNOSIS — Z006 Encounter for examination for normal comparison and control in clinical research program: Secondary | ICD-10-CM | POA: Diagnosis not present

## 2019-01-24 DIAGNOSIS — Z5181 Encounter for therapeutic drug level monitoring: Secondary | ICD-10-CM | POA: Diagnosis not present

## 2019-01-24 DIAGNOSIS — Z006 Encounter for examination for normal comparison and control in clinical research program: Secondary | ICD-10-CM | POA: Diagnosis not present

## 2019-01-24 DIAGNOSIS — Z79899 Other long term (current) drug therapy: Secondary | ICD-10-CM | POA: Diagnosis not present

## 2019-01-24 DIAGNOSIS — Z942 Lung transplant status: Secondary | ICD-10-CM | POA: Diagnosis not present

## 2019-01-24 DIAGNOSIS — T86810 Lung transplant rejection: Secondary | ICD-10-CM | POA: Diagnosis not present

## 2019-01-24 DIAGNOSIS — J42 Unspecified chronic bronchitis: Secondary | ICD-10-CM | POA: Diagnosis not present

## 2019-01-25 DIAGNOSIS — T86810 Lung transplant rejection: Secondary | ICD-10-CM | POA: Diagnosis not present

## 2019-01-25 DIAGNOSIS — Z006 Encounter for examination for normal comparison and control in clinical research program: Secondary | ICD-10-CM | POA: Diagnosis not present

## 2019-02-07 DIAGNOSIS — Z79899 Other long term (current) drug therapy: Secondary | ICD-10-CM | POA: Diagnosis not present

## 2019-02-07 DIAGNOSIS — T86818 Other complications of lung transplant: Secondary | ICD-10-CM | POA: Diagnosis not present

## 2019-02-07 DIAGNOSIS — Z5181 Encounter for therapeutic drug level monitoring: Secondary | ICD-10-CM | POA: Diagnosis not present

## 2019-02-18 DIAGNOSIS — Z942 Lung transplant status: Secondary | ICD-10-CM | POA: Diagnosis not present

## 2019-02-18 DIAGNOSIS — Z85828 Personal history of other malignant neoplasm of skin: Secondary | ICD-10-CM | POA: Diagnosis not present

## 2019-02-18 DIAGNOSIS — T86818 Other complications of lung transplant: Secondary | ICD-10-CM | POA: Diagnosis not present

## 2019-02-18 DIAGNOSIS — N183 Chronic kidney disease, stage 3 (moderate): Secondary | ICD-10-CM | POA: Diagnosis not present

## 2019-02-18 DIAGNOSIS — E785 Hyperlipidemia, unspecified: Secondary | ICD-10-CM | POA: Diagnosis not present

## 2019-02-18 DIAGNOSIS — Z79899 Other long term (current) drug therapy: Secondary | ICD-10-CM | POA: Diagnosis not present

## 2019-02-18 DIAGNOSIS — Z7952 Long term (current) use of systemic steroids: Secondary | ICD-10-CM | POA: Diagnosis not present

## 2019-02-18 DIAGNOSIS — T86810 Lung transplant rejection: Secondary | ICD-10-CM | POA: Insufficient documentation

## 2019-02-18 DIAGNOSIS — I129 Hypertensive chronic kidney disease with stage 1 through stage 4 chronic kidney disease, or unspecified chronic kidney disease: Secondary | ICD-10-CM | POA: Diagnosis not present

## 2019-02-18 DIAGNOSIS — J8410 Pulmonary fibrosis, unspecified: Secondary | ICD-10-CM | POA: Diagnosis not present

## 2019-02-18 DIAGNOSIS — Z006 Encounter for examination for normal comparison and control in clinical research program: Secondary | ICD-10-CM | POA: Diagnosis not present

## 2019-02-18 DIAGNOSIS — D899 Disorder involving the immune mechanism, unspecified: Secondary | ICD-10-CM | POA: Diagnosis not present

## 2019-02-18 DIAGNOSIS — R609 Edema, unspecified: Secondary | ICD-10-CM | POA: Diagnosis not present

## 2019-02-18 DIAGNOSIS — G4733 Obstructive sleep apnea (adult) (pediatric): Secondary | ICD-10-CM | POA: Diagnosis not present

## 2019-02-18 DIAGNOSIS — Z4824 Encounter for aftercare following lung transplant: Secondary | ICD-10-CM | POA: Diagnosis not present

## 2019-02-18 DIAGNOSIS — E1122 Type 2 diabetes mellitus with diabetic chronic kidney disease: Secondary | ICD-10-CM | POA: Diagnosis not present

## 2019-02-18 DIAGNOSIS — Z23 Encounter for immunization: Secondary | ICD-10-CM | POA: Diagnosis not present

## 2019-02-18 DIAGNOSIS — Z5181 Encounter for therapeutic drug level monitoring: Secondary | ICD-10-CM | POA: Diagnosis not present

## 2019-02-18 DIAGNOSIS — N400 Enlarged prostate without lower urinary tract symptoms: Secondary | ICD-10-CM | POA: Diagnosis not present

## 2019-02-18 DIAGNOSIS — R911 Solitary pulmonary nodule: Secondary | ICD-10-CM | POA: Diagnosis not present

## 2019-02-18 DIAGNOSIS — J988 Other specified respiratory disorders: Secondary | ICD-10-CM | POA: Diagnosis not present

## 2019-02-19 ENCOUNTER — Ambulatory Visit: Payer: Self-pay | Admitting: Adult Health

## 2019-02-21 DIAGNOSIS — Z006 Encounter for examination for normal comparison and control in clinical research program: Secondary | ICD-10-CM | POA: Diagnosis not present

## 2019-02-21 DIAGNOSIS — T86810 Lung transplant rejection: Secondary | ICD-10-CM | POA: Diagnosis not present

## 2019-02-21 DIAGNOSIS — J42 Unspecified chronic bronchitis: Secondary | ICD-10-CM | POA: Diagnosis not present

## 2019-02-22 DIAGNOSIS — Z942 Lung transplant status: Secondary | ICD-10-CM | POA: Diagnosis not present

## 2019-02-22 DIAGNOSIS — T86810 Lung transplant rejection: Secondary | ICD-10-CM | POA: Diagnosis not present

## 2019-02-22 DIAGNOSIS — Z006 Encounter for examination for normal comparison and control in clinical research program: Secondary | ICD-10-CM | POA: Diagnosis not present

## 2019-02-22 DIAGNOSIS — J42 Unspecified chronic bronchitis: Secondary | ICD-10-CM | POA: Diagnosis not present

## 2019-02-25 DIAGNOSIS — R918 Other nonspecific abnormal finding of lung field: Secondary | ICD-10-CM | POA: Diagnosis not present

## 2019-02-25 DIAGNOSIS — R911 Solitary pulmonary nodule: Secondary | ICD-10-CM | POA: Diagnosis not present

## 2019-02-26 ENCOUNTER — Encounter: Payer: Self-pay | Admitting: Adult Health

## 2019-02-26 ENCOUNTER — Other Ambulatory Visit: Payer: Self-pay

## 2019-02-26 ENCOUNTER — Ambulatory Visit (INDEPENDENT_AMBULATORY_CARE_PROVIDER_SITE_OTHER): Payer: Medicare Other | Admitting: Adult Health

## 2019-02-26 VITALS — BP 140/76 | HR 88 | Resp 16 | Ht 69.0 in | Wt 193.0 lb

## 2019-02-26 DIAGNOSIS — Z0001 Encounter for general adult medical examination with abnormal findings: Secondary | ICD-10-CM

## 2019-02-26 DIAGNOSIS — R300 Dysuria: Secondary | ICD-10-CM

## 2019-02-26 DIAGNOSIS — Z125 Encounter for screening for malignant neoplasm of prostate: Secondary | ICD-10-CM | POA: Diagnosis not present

## 2019-02-26 DIAGNOSIS — I10 Essential (primary) hypertension: Secondary | ICD-10-CM

## 2019-02-26 DIAGNOSIS — Z942 Lung transplant status: Secondary | ICD-10-CM | POA: Diagnosis not present

## 2019-02-26 DIAGNOSIS — E1165 Type 2 diabetes mellitus with hyperglycemia: Secondary | ICD-10-CM | POA: Diagnosis not present

## 2019-02-26 DIAGNOSIS — E785 Hyperlipidemia, unspecified: Secondary | ICD-10-CM

## 2019-02-26 LAB — POCT GLYCOSYLATED HEMOGLOBIN (HGB A1C): Hemoglobin A1C: 8 % — AB (ref 4.0–5.6)

## 2019-02-26 NOTE — Progress Notes (Addendum)
[facility] [place], [place] [address]  Internal MEDICINE  Office Visit Note  Patient Name: Caleb [name]  [id number]  [id number]  Date of Service: 04/10/2019  Chief Complaint  Patient presents with  . Medical Management of Chronic Issues  . Annual Exam    medicare wellness visit   . Diabetes  . Hypertension  . Hyperlipidemia     HPI Pt is here for routine health maintenance examination.  Pt is a well appearing 73 yo male.  He has a history of DM, HTN, and HLD. He also has history of lung cancer from 2016.  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  Overall he is doing well with this.    Current Medication: Outpatient Encounter Medications as of 02/26/2019  Medication Sig  . acetaminophen (TYLENOL) 325 MG tablet Take 650 mg by mouth every 6 (six) hours as needed.  Marland Kitchen albuterol (PROVENTIL) (2.5 MG/3ML) 0.083% nebulizer solution Inhale into the lungs.  Marland Kitchen albuterol (VENTOLIN HFA) 108 (90 Base) MCG/ACT inhaler Inhale into the lungs.  Marland Kitchen aspirin 81 MG tablet Take 81 mg by mouth daily.  Marland Kitchen azithromycin (ZITHROMAX) 250 MG tablet Take by mouth daily.  . budesonide-formoterol (SYMBICORT) 160-4.5 MCG/ACT inhaler Inhale into the lungs.  . Cholecalciferol (VITAMIN D3) 1000 units CAPS Take by mouth.  . fexofenadine (ALLEGRA) 180 MG tablet Take 180 mg by mouth daily.  . furosemide (LASIX) 20 MG tablet Take 20 mg by mouth. One tablet by mouth three times a week Sunday , Tuesday , friday  . glipiZIDE (GLUCOTROL) 5 MG tablet TK 1 T PO BID B MEALS  . glucose blood (ONETOUCH VERIO) test strip TEST BLOOD SUGARS TWICE  DAILY  . Immune Globulin 10% (GAMUNEX-C) 10 GM/100ML SOLN Inject into the vein. Inject 40 g into the vein monthly  . Isavuconazonium Sulfate (CRESEMBA) 186 MG CAPS Take by mouth.  . Lancets 28G MISC Use as instructed QID  . lovastatin (MEVACOR) 20 MG tablet Take 20 mg by mouth daily.  . Melatonin (MELATONIN MAXIMUM  STRENGTH) 5 MG TABS Take by mouth.  . metoprolol tartrate (LOPRESSOR) 25 MG tablet TK 1/2 T PO BID  . Multiple Vitamin (MULTI-VITAMINS) TABS Take by mouth.  . mycophenolate (CELLCEPT) 250 MG capsule Take by mouth 2 (two) times daily. Take four capsules by mouth every twelve hours  . pantoprazole (PROTONIX) 40 MG tablet Take 40 mg by mouth 2 (two) times daily.   . predniSONE (DELTASONE) 5 MG tablet TK 1 T PO  QD  . Sirolimus (RAPAMUNE) 0.5 MG tablet Take by mouth.  . sodium chloride HYPERTONIC 3 % nebulizer solution Inhale 28mls via nebulizer twice daily, following albuterol neb.  . tacrolimus (PROGRAF) 0.5 MG capsule 02/01/18: Take in combination with 1 mg tablets for a total of 1 mg by mouth in the morning, and 0.5 mg in the evening.  . tacrolimus (PROGRAF) 1 MG capsule 02/01/18: Take in combination with 0.5 mg tablets for a total of 1 mg by mouth in the morning, and 0.5 mg in the evening.  . tamsulosin (FLOMAX) 0.4 MG CAPS Take 0.4 mg by mouth daily after breakfast. 1 on saturdays  . valGANciclovir (VALCYTE) 450 MG tablet Take 450 mg by mouth every other day.   No facility-administered encounter medications on file as of 02/26/2019.     Surgical History: Past Surgical History:  Procedure Laterality Date  . LUNG TRANSPLANT      Medical History: Past  Medical History:  Diagnosis Date  . BPH (benign prostatic hyperplasia)   . Chest pain, unspecified   . Diabetes mellitus without complication ([place])   . Dyspnea   . Hyperlipidemia   . Hypertension     Family History: Family History  Problem Relation Age of Onset  . Coronary artery disease Other   . Diabetes Other       Review of Systems  Constitutional: Negative.  Negative for chills, fatigue and unexpected weight change.  HENT: Negative.  Negative for congestion, rhinorrhea, sneezing and sore throat.   Eyes: Negative for redness.  Respiratory: Negative.  Negative for cough, chest tightness and shortness of breath.    Cardiovascular: Negative.  Negative for chest pain and palpitations.  Gastrointestinal: Negative.  Negative for abdominal pain, constipation, diarrhea, nausea and vomiting.  Endocrine: Negative.   Genitourinary: Negative.  Negative for dysuria and frequency.  Musculoskeletal: Negative.  Negative for arthralgias, back pain, joint swelling and neck pain.  Skin: Negative.  Negative for rash.  Allergic/Immunologic: Negative.   Neurological: Negative.  Negative for tremors and numbness.  Hematological: Negative for adenopathy. Does not bruise/bleed easily.  Psychiatric/Behavioral: Negative.  Negative for behavioral problems, sleep disturbance and suicidal ideas. The patient is not nervous/anxious.      Vital Signs: BP 140/76   Pulse 88   Resp 16   Ht 5\' 9"  (1.753 m)   Wt 193 lb (87.5 kg)   SpO2 97%   BMI 28.50 kg/m    Physical Exam Vitals signs and nursing note reviewed.  Constitutional:      General: He is not in acute distress.    Appearance: He is well-developed. He is not diaphoretic.  HENT:     Head: Normocephalic and atraumatic.     Mouth/Throat:     Pharynx: No oropharyngeal exudate.  Eyes:     Pupils: Pupils are equal, round, and reactive to light.  Neck:     Musculoskeletal: Normal range of motion and neck supple.     Thyroid: No thyromegaly.     Vascular: No JVD.     Trachea: No tracheal deviation.  Cardiovascular:     Rate and Rhythm: Normal rate and regular rhythm.     Heart sounds: Normal heart sounds. No murmur. No friction rub. No gallop.   Pulmonary:     Effort: Pulmonary effort is normal. No respiratory distress.     Breath sounds: Normal breath sounds. No wheezing or rales.  Chest:     Chest wall: No tenderness.  Abdominal:     Palpations: Abdomen is soft.     Tenderness: There is no abdominal tenderness. There is no guarding.  Musculoskeletal: Normal range of motion.  Lymphadenopathy:     Cervical: No cervical adenopathy.  Skin:    General: Skin  is warm and dry.  Neurological:     Mental Status: He is alert and oriented to person, place, and time.     Cranial Nerves: No cranial nerve deficit.  Psychiatric:        Behavior: Behavior normal.        Thought Content: Thought content normal.        Judgment: Judgment normal.      LABS: Recent Results (from the past 2160 hour(s))  UA/M w/rflx Culture, Routine     Status: Abnormal   Collection Time: 02/26/19  9:09 AM   Specimen: Urine   URINE  Result Value Ref Range   Specific Gravity, UA 1.008 1.005 - 1.030  pH, UA 8.0 (H) 5.0 - 7.5   Color, UA Yellow Yellow   Appearance Ur Clear Clear   Leukocytes,UA Negative Negative   Protein,UA Negative Negative/Trace   Glucose, UA Trace (A) Negative   Ketones, UA Negative Negative   RBC, UA Negative Negative   Bilirubin, UA Negative Negative   Urobilinogen, Ur 0.2 0.2 - 1.0 mg/dL   Nitrite, UA Negative Negative   Microscopic Examination Comment     Comment: Microscopic follows if indicated.   Microscopic Examination See below:     Comment: Microscopic was indicated and was performed.   Urinalysis Reflex Comment     Comment: This specimen will not reflex to a Urine Culture.  Microscopic Examination     Status: None   Collection Time: 02/26/19  9:09 AM   URINE  Result Value Ref Range   WBC, UA None seen 0 - 5 /hpf   RBC 0-2 0 - 2 /hpf   Epithelial Cells (non renal) None seen 0 - 10 /hpf   Casts None seen None seen /lpf   Bacteria, UA None seen None seen/Few  POCT HgB A1C     Status: Abnormal   Collection Time: 02/26/19  9:26 AM  Result Value Ref Range   Hemoglobin A1C 8.0 (A) 4.0 - 5.6 %   HbA1c POC (<> result, manual entry)     HbA1c, POC (prediabetic range)     HbA1c, POC (controlled diabetic range)       Assessment/Plan: 1. Encounter for general adult medical examination with abnormal findings Up to date on PHM. - Lipid Panel With LDL/HDL Ratio - TSH - T4, free - Comprehensive metabolic panel - PSA  2.  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  - POCT HgB A1C  3. Hyperlipidemia, unspecified hyperlipidemia type Lipid panel ordered, will review when results are available.   4. Essential hypertension Stable, continue present management.   5. 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   6. Dysuria - UA/M w/rflx Culture, Routine  7. Screening for prostate cancer PSA level sent  General Counseling: jermarion poffenberger understanding of the findings of todays visit and agrees with plan of treatment. I have discussed any further diagnostic evaluation that may be needed or ordered today. We also reviewed his medications today. he has been encouraged to call the office with any questions or concerns that should arise related to todays visit.   Orders Placed This Encounter  Procedures  . Microscopic Examination  . UA/M w/rflx Culture, Routine  . POCT HgB A1C    No orders of the defined types were placed in this encounter.   Time spent: 35 Minutes   This patient was seen by Orson Gear AGNP-C in Collaboration with Dr Lavera Guise as a part of collaborative care agreement    Kendell Bane AGNP-C Internal Medicine

## 2019-02-27 LAB — MICROSCOPIC EXAMINATION
Bacteria, UA: NONE SEEN
Casts: NONE SEEN /lpf
Epithelial Cells (non renal): NONE SEEN /hpf (ref 0–10)
WBC, UA: NONE SEEN /hpf (ref 0–5)

## 2019-02-27 LAB — UA/M W/RFLX CULTURE, ROUTINE
Bilirubin, UA: NEGATIVE
Ketones, UA: NEGATIVE
Leukocytes,UA: NEGATIVE
Nitrite, UA: NEGATIVE
Protein,UA: NEGATIVE
RBC, UA: NEGATIVE
Specific Gravity, UA: 1.008 (ref 1.005–1.030)
Urobilinogen, Ur: 0.2 mg/dL (ref 0.2–1.0)
pH, UA: 8 — ABNORMAL HIGH (ref 5.0–7.5)

## 2019-02-28 ENCOUNTER — Ambulatory Visit: Payer: Medicare Other | Admitting: Adult Health

## 2019-03-13 DIAGNOSIS — T86818 Other complications of lung transplant: Secondary | ICD-10-CM | POA: Diagnosis not present

## 2019-03-13 DIAGNOSIS — Z5181 Encounter for therapeutic drug level monitoring: Secondary | ICD-10-CM | POA: Diagnosis not present

## 2019-03-13 DIAGNOSIS — Z79899 Other long term (current) drug therapy: Secondary | ICD-10-CM | POA: Diagnosis not present

## 2019-03-21 DIAGNOSIS — Z5181 Encounter for therapeutic drug level monitoring: Secondary | ICD-10-CM | POA: Diagnosis not present

## 2019-03-21 DIAGNOSIS — J42 Unspecified chronic bronchitis: Secondary | ICD-10-CM | POA: Diagnosis not present

## 2019-03-21 DIAGNOSIS — T86810 Lung transplant rejection: Secondary | ICD-10-CM | POA: Diagnosis not present

## 2019-03-21 DIAGNOSIS — Z79899 Other long term (current) drug therapy: Secondary | ICD-10-CM | POA: Diagnosis not present

## 2019-03-21 DIAGNOSIS — R911 Solitary pulmonary nodule: Secondary | ICD-10-CM | POA: Diagnosis not present

## 2019-03-21 DIAGNOSIS — D89811 Chronic graft-versus-host disease: Secondary | ICD-10-CM | POA: Diagnosis not present

## 2019-03-21 DIAGNOSIS — Z006 Encounter for examination for normal comparison and control in clinical research program: Secondary | ICD-10-CM | POA: Diagnosis not present

## 2019-03-22 DIAGNOSIS — Z942 Lung transplant status: Secondary | ICD-10-CM | POA: Diagnosis not present

## 2019-03-22 DIAGNOSIS — J42 Unspecified chronic bronchitis: Secondary | ICD-10-CM | POA: Diagnosis not present

## 2019-03-22 DIAGNOSIS — Z006 Encounter for examination for normal comparison and control in clinical research program: Secondary | ICD-10-CM | POA: Diagnosis not present

## 2019-03-22 DIAGNOSIS — T86810 Lung transplant rejection: Secondary | ICD-10-CM | POA: Diagnosis not present

## 2019-05-16 DIAGNOSIS — Z006 Encounter for examination for normal comparison and control in clinical research program: Secondary | ICD-10-CM | POA: Diagnosis not present

## 2019-05-16 DIAGNOSIS — Z942 Lung transplant status: Secondary | ICD-10-CM | POA: Diagnosis not present

## 2019-06-04 DIAGNOSIS — T86818 Other complications of lung transplant: Secondary | ICD-10-CM | POA: Diagnosis not present

## 2019-06-04 DIAGNOSIS — Z5181 Encounter for therapeutic drug level monitoring: Secondary | ICD-10-CM | POA: Diagnosis not present

## 2019-06-11 ENCOUNTER — Telehealth: Payer: Self-pay

## 2019-06-11 NOTE — Telephone Encounter (Signed)
Error

## 2019-06-13 DIAGNOSIS — T86810 Lung transplant rejection: Secondary | ICD-10-CM | POA: Diagnosis not present

## 2019-06-13 DIAGNOSIS — J42 Unspecified chronic bronchitis: Secondary | ICD-10-CM | POA: Diagnosis not present

## 2019-06-13 DIAGNOSIS — Z006 Encounter for examination for normal comparison and control in clinical research program: Secondary | ICD-10-CM | POA: Diagnosis not present

## 2019-06-14 DIAGNOSIS — Z006 Encounter for examination for normal comparison and control in clinical research program: Secondary | ICD-10-CM | POA: Diagnosis not present

## 2019-06-14 DIAGNOSIS — T86810 Lung transplant rejection: Secondary | ICD-10-CM | POA: Diagnosis not present

## 2019-07-11 DIAGNOSIS — J42 Unspecified chronic bronchitis: Secondary | ICD-10-CM | POA: Diagnosis not present

## 2019-07-11 DIAGNOSIS — T86810 Lung transplant rejection: Secondary | ICD-10-CM | POA: Diagnosis not present

## 2019-07-11 DIAGNOSIS — Z006 Encounter for examination for normal comparison and control in clinical research program: Secondary | ICD-10-CM | POA: Diagnosis not present

## 2019-07-12 DIAGNOSIS — T86810 Lung transplant rejection: Secondary | ICD-10-CM | POA: Diagnosis not present

## 2019-07-12 DIAGNOSIS — Z006 Encounter for examination for normal comparison and control in clinical research program: Secondary | ICD-10-CM | POA: Diagnosis not present

## 2019-07-18 ENCOUNTER — Other Ambulatory Visit: Payer: Self-pay

## 2019-07-18 ENCOUNTER — Encounter: Payer: Self-pay | Admitting: Nurse Practitioner

## 2019-07-18 ENCOUNTER — Ambulatory Visit (INDEPENDENT_AMBULATORY_CARE_PROVIDER_SITE_OTHER): Payer: Medicare Other | Admitting: Nurse Practitioner

## 2019-07-18 VITALS — BP 134/88 | HR 106 | Ht 69.0 in | Wt 192.0 lb

## 2019-07-18 DIAGNOSIS — E119 Type 2 diabetes mellitus without complications: Secondary | ICD-10-CM

## 2019-07-18 DIAGNOSIS — I10 Essential (primary) hypertension: Secondary | ICD-10-CM

## 2019-07-18 DIAGNOSIS — Z942 Lung transplant status: Secondary | ICD-10-CM | POA: Diagnosis not present

## 2019-07-18 DIAGNOSIS — R000 Tachycardia, unspecified: Secondary | ICD-10-CM | POA: Diagnosis not present

## 2019-07-18 MED ORDER — METOPROLOL TARTRATE 25 MG PO TABS: ORAL_TABLET | ORAL | 1 refills | Status: DC

## 2019-07-18 NOTE — Progress Notes (Signed)
[facility] [place], [place] [address]  Internal MEDICINE  Telephone Visit  Patient Name: Caleb [name]  [id number]  [id number]  Date of Service: 07/18/2019  I connected with the patient at 1:48pm by telephone and verified the patients identity using two identifiers.   I discussed the limitations, risks, security and privacy concerns of performing an evaluation and management service by telephone and the availability of in person appointments. I also discussed with the patient that there may be a patient responsible charge related to the service.  The patient expressed understanding and agrees to proceed.    Chief Complaint  Patient presents with  . Telephone Screen    Bp   . Telephone Assessment  . Hypertension    The patient has been contacted via telephone for follow up visit due to concerns for spread of novel coronavirus. He presents for acute visit. States that his blood pressure had been running very elevated. He ended up running out of his blood pressure medication as he was instructed to increase his metoprolol twice daily. Initial prescription was written for 25mg , half tablet twice daily. He increased it to one tablet twice daily. Due to the increase in the tablets, he ran out. He has been out of toprol about two days ago. Since he ran out of the medication, his blood pressure has actually been better, but pulse has been a bit elevated . 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 He attends plasmapheresis treatments for this. His blood sugar and blood pressure are monitored cocnsistently during these visits. Transplant center and coordinator handle most of his chronic health conditions, including diabetes.  He states that his blood sugars have been doing well for the most part.       Current Medication: Outpatient Encounter Medications as of 07/18/2019  Medication Sig  . acetaminophen (TYLENOL) 325 MG tablet Take  650 mg by mouth every 6 (six) hours as needed.  Marland Kitchen albuterol (PROVENTIL) (2.5 MG/3ML) 0.083% nebulizer solution Inhale into the lungs.  Marland Kitchen aspirin 81 MG tablet Take 81 mg by mouth daily.  Marland Kitchen azithromycin (ZITHROMAX) 250 MG tablet Take by mouth daily.  . Cholecalciferol (VITAMIN D3) 1000 units CAPS Take by mouth.  . fexofenadine (ALLEGRA) 180 MG tablet Take 180 mg by mouth daily.  . furosemide (LASIX) 20 MG tablet Take 20 mg by mouth. One tablet by mouth three times a week Sunday , Tuesday , friday  . glipiZIDE (GLUCOTROL) 5 MG tablet TK 1 T PO BID B MEALS  . glucose blood (ONETOUCH VERIO) test strip TEST BLOOD SUGARS TWICE  DAILY  . Immune Globulin 10% (GAMUNEX-C) 10 GM/100ML SOLN Inject into the vein. Inject 40 g into the vein monthly  . Isavuconazonium Sulfate (CRESEMBA) 186 MG CAPS Take by mouth.  . Lancets 28G MISC Use as instructed QID  . lovastatin (MEVACOR) 20 MG tablet Take 20 mg by mouth daily.  . Melatonin (MELATONIN MAXIMUM STRENGTH) 5 MG TABS Take by mouth.  . metoprolol tartrate (LOPRESSOR) 25 MG tablet Take 1/2 to 1 tablet po BID  . Multiple Vitamin (MULTI-VITAMINS) TABS Take by mouth.  . mycophenolate (CELLCEPT) 250 MG capsule Take by mouth 2 (two) times daily. Take four capsules by mouth every twelve hours  . pantoprazole (PROTONIX) 40 MG tablet Take 40 mg by mouth 2 (two) times daily.   . predniSONE (DELTASONE) 5 MG tablet TK 1 T PO  QD  . Sirolimus (RAPAMUNE) 0.5 MG  tablet Take by mouth.  . sodium chloride HYPERTONIC 3 % nebulizer solution Inhale 20mls via nebulizer twice daily, following albuterol neb.  . tacrolimus (PROGRAF) 0.5 MG capsule 02/01/18: Take in combination with 1 mg tablets for a total of 1 mg by mouth in the morning, and 0.5 mg in the evening.  . tacrolimus (PROGRAF) 1 MG capsule 02/01/18: Take in combination with 0.5 mg tablets for a total of 1 mg by mouth in the morning, and 0.5 mg in the evening.  . tamsulosin (FLOMAX) 0.4 MG CAPS Take 0.4 mg by mouth daily after  breakfast. 1 on saturdays  . valGANciclovir (VALCYTE) 450 MG tablet Take 450 mg by mouth every other day.  . [DISCONTINUED] metoprolol tartrate (LOPRESSOR) 25 MG tablet TK 1/2 T PO BID  . albuterol (VENTOLIN HFA) 108 (90 Base) MCG/ACT inhaler Inhale into the lungs.  . budesonide-formoterol (SYMBICORT) 160-4.5 MCG/ACT inhaler Inhale into the lungs.   No facility-administered encounter medications on file as of 07/18/2019.    Surgical History: Past Surgical History:  Procedure Laterality Date  . LUNG TRANSPLANT      Medical History: Past Medical History:  Diagnosis Date  . BPH (benign prostatic hyperplasia)   . Chest pain, unspecified   . Diabetes mellitus without complication ([place])   . Dyspnea   . Hyperlipidemia   . Hypertension     Family History: Family History  Problem Relation Age of Onset  . Coronary artery disease Other   . Diabetes Other     Social History   Socioeconomic History  . Marital status: Married    Spouse name: Not on file  . Number of children: Not on file  . Years of education: Not on file  . Highest education level: Not on file  Occupational History  . Not on file  Tobacco Use  . Smoking status: Former Smoker    Types: Cigarettes    Quit date: 05/16/1979    Years since quitting: 40.2  . Smokeless tobacco: Never Used  . Tobacco comment: Quit 1980  Substance and Sexual Activity  . Alcohol use: Not Currently  . Drug use: No  . Sexual activity: Not on file  Other Topics Concern  . 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    Social Determinants of Health   Financial Resource Strain:   . Difficulty of Paying Living Expenses: Not on file  Food Insecurity:   . Worried About Charity fundraiser in the Last Year: Not on file  . Ran Out of Food in the Last Year: Not on file  Transportation Needs:   . Lack of Transportation (Medical): Not on file  . Lack of Transportation (Non-Medical): Not  on file  Physical Activity:   . Days of Exercise per Week: Not on file  . Minutes of Exercise per Session: Not on file  Stress:   . Feeling of Stress : Not on file  Social Connections:   . Frequency of Communication with Friends and Family: Not on file  . Frequency of Social Gatherings with Friends and Family: Not on file  . Attends Religious Services: Not on file  . Active Member of Clubs or Organizations: Not on file  . Attends Archivist Meetings: Not on file  . Marital Status: Not on file  Intimate Partner Violence:   . Fear of Current or Ex-Partner: Not on file  . Emotionally Abused: Not on file  . Physically  Abused: Not on file  . Sexually Abused: Not on file      Review of Systems  Constitutional: Positive for fatigue. Negative for chills and unexpected weight change.  HENT: Negative for congestion, postnasal drip, rhinorrhea, sneezing and sore throat.   Respiratory: Positive for chest tightness and shortness of breath. Negative for cough.   Cardiovascular: Negative for chest pain and palpitations.       Recently elevated blood pressure.   Gastrointestinal: Negative for abdominal pain, constipation, diarrhea, nausea and vomiting.  Endocrine:       Blood sugars doing well   Musculoskeletal: Negative for arthralgias, back pain, joint swelling and neck pain.  Skin: Negative for rash.  Allergic/Immunologic: Negative for environmental allergies.  Neurological: Negative for dizziness, tremors, numbness and headaches.  Hematological: Negative for adenopathy. Does not bruise/bleed easily.  Psychiatric/Behavioral: Negative for behavioral problems (Depression), sleep disturbance and suicidal ideas. The patient is not nervous/anxious.     Today's Vitals   07/18/19 1337  BP: 134/88  Pulse: (!) 106  Weight: 192 lb (87.1 kg)  Height: 5\' 9"  (1.753 m)   Body mass index is 28.35 kg/m.  Observation/Objective:  The patient is alert and oriented. He is pleasant and  answering all questions appropriately. Breathing is non-labored. He is in no acute distress. The patient is easily winded. He sounds worried.    Assessment/Plan: 1. Essential hypertension Trial of metoprolol 25mg , taking 1/2 to 1 tablet po bid as needed. New prescription sent to his pharmacy  - metoprolol tartrate (LOPRESSOR) 25 MG tablet; Take 1/2 to 1 tablet po BID  Dispense: 60 tablet; Refill: 1  2. Tachycardia Has been out of metoprolol for nearly two days. New prescription sent to his pharmacy today. Monitor closely.   3. H/O lung transplant ([place]) Most medications managed through transplant center. Follow up as needed and as scheduled. - metoprolol tartrate (LOPRESSOR) 25 MG tablet; Take 1/2 to 1 tablet po BID  Dispense: 60 tablet; Refill: 1  4. Diabetes mellitus without complication ([place]) Continue all diabetic medication as prescribed   General Counseling: edras wilford understanding of the findings of today's phone visit and agrees with plan of treatment. I have discussed any further diagnostic evaluation that may be needed or ordered today. We also reviewed his medications today. he has been encouraged to call the office with any questions or concerns that should arise related to todays visit.   This patient was seen by [place] with Dr Lavera Guise as a part of collaborative care agreement  Meds ordered this encounter  Medications  . metoprolol tartrate (LOPRESSOR) 25 MG tablet    Sig: Take 1/2 to 1 tablet po BID    Dispense:  60 tablet    Refill:  1    Please note I have adjusted the prescription and changed quantity to 60 tablets in 30 days.    Order Specific Question:   Supervising Provider    Answer:   Lavera Guise [[id number]]    Time spent: 32 Minutes    Dr Lavera Guise Internal medicine

## 2019-07-19 ENCOUNTER — Other Ambulatory Visit: Payer: Self-pay

## 2019-07-19 DIAGNOSIS — Z942 Lung transplant status: Secondary | ICD-10-CM

## 2019-07-19 DIAGNOSIS — I10 Essential (primary) hypertension: Secondary | ICD-10-CM

## 2019-07-19 MED ORDER — METOPROLOL TARTRATE 25 MG PO TABS: ORAL_TABLET | ORAL | 1 refills | Status: DC

## 2019-07-22 DIAGNOSIS — I129 Hypertensive chronic kidney disease with stage 1 through stage 4 chronic kidney disease, or unspecified chronic kidney disease: Secondary | ICD-10-CM | POA: Diagnosis not present

## 2019-07-22 DIAGNOSIS — N400 Enlarged prostate without lower urinary tract symptoms: Secondary | ICD-10-CM | POA: Diagnosis not present

## 2019-07-22 DIAGNOSIS — Z4824 Encounter for aftercare following lung transplant: Secondary | ICD-10-CM | POA: Diagnosis not present

## 2019-07-22 DIAGNOSIS — Z5181 Encounter for therapeutic drug level monitoring: Secondary | ICD-10-CM | POA: Diagnosis not present

## 2019-07-22 DIAGNOSIS — R911 Solitary pulmonary nodule: Secondary | ICD-10-CM | POA: Diagnosis not present

## 2019-07-22 DIAGNOSIS — J84112 Idiopathic pulmonary fibrosis: Secondary | ICD-10-CM | POA: Diagnosis not present

## 2019-07-22 DIAGNOSIS — Z006 Encounter for examination for normal comparison and control in clinical research program: Secondary | ICD-10-CM | POA: Diagnosis not present

## 2019-07-22 DIAGNOSIS — T86818 Other complications of lung transplant: Secondary | ICD-10-CM | POA: Diagnosis not present

## 2019-07-22 DIAGNOSIS — E785 Hyperlipidemia, unspecified: Secondary | ICD-10-CM | POA: Diagnosis not present

## 2019-07-22 DIAGNOSIS — D849 Immunodeficiency, unspecified: Secondary | ICD-10-CM | POA: Diagnosis not present

## 2019-07-22 DIAGNOSIS — R918 Other nonspecific abnormal finding of lung field: Secondary | ICD-10-CM | POA: Diagnosis not present

## 2019-07-22 DIAGNOSIS — Z942 Lung transplant status: Secondary | ICD-10-CM | POA: Diagnosis not present

## 2019-07-22 DIAGNOSIS — T86810 Lung transplant rejection: Secondary | ICD-10-CM | POA: Diagnosis not present

## 2019-07-22 DIAGNOSIS — G4733 Obstructive sleep apnea (adult) (pediatric): Secondary | ICD-10-CM | POA: Diagnosis not present

## 2019-07-22 DIAGNOSIS — N183 Chronic kidney disease, stage 3 unspecified: Secondary | ICD-10-CM | POA: Diagnosis not present

## 2019-07-22 DIAGNOSIS — E1122 Type 2 diabetes mellitus with diabetic chronic kidney disease: Secondary | ICD-10-CM | POA: Diagnosis not present

## 2019-08-06 DIAGNOSIS — Z79899 Other long term (current) drug therapy: Secondary | ICD-10-CM | POA: Diagnosis not present

## 2019-08-06 DIAGNOSIS — Z5181 Encounter for therapeutic drug level monitoring: Secondary | ICD-10-CM | POA: Diagnosis not present

## 2019-08-06 DIAGNOSIS — T86818 Other complications of lung transplant: Secondary | ICD-10-CM | POA: Diagnosis not present

## 2019-08-08 DIAGNOSIS — T86810 Lung transplant rejection: Secondary | ICD-10-CM | POA: Diagnosis not present

## 2019-08-08 DIAGNOSIS — J42 Unspecified chronic bronchitis: Secondary | ICD-10-CM | POA: Diagnosis not present

## 2019-08-08 DIAGNOSIS — Z006 Encounter for examination for normal comparison and control in clinical research program: Secondary | ICD-10-CM | POA: Diagnosis not present

## 2019-08-09 DIAGNOSIS — T86810 Lung transplant rejection: Secondary | ICD-10-CM | POA: Diagnosis not present

## 2019-08-09 DIAGNOSIS — Z942 Lung transplant status: Secondary | ICD-10-CM | POA: Diagnosis not present

## 2019-08-09 DIAGNOSIS — Z006 Encounter for examination for normal comparison and control in clinical research program: Secondary | ICD-10-CM | POA: Diagnosis not present

## 2019-08-12 DIAGNOSIS — Z23 Encounter for immunization: Secondary | ICD-10-CM | POA: Diagnosis not present

## 2019-08-14 ENCOUNTER — Other Ambulatory Visit: Payer: Self-pay

## 2019-08-14 DIAGNOSIS — Z942 Lung transplant status: Secondary | ICD-10-CM

## 2019-08-14 DIAGNOSIS — I10 Essential (primary) hypertension: Secondary | ICD-10-CM

## 2019-08-14 MED ORDER — METOPROLOL TARTRATE 25 MG PO TABS: ORAL_TABLET | ORAL | 1 refills | Status: DC

## 2019-08-15 DIAGNOSIS — J479 Bronchiectasis, uncomplicated: Secondary | ICD-10-CM | POA: Diagnosis not present

## 2019-08-15 DIAGNOSIS — I130 Hypertensive heart and chronic kidney disease with heart failure and stage 1 through stage 4 chronic kidney disease, or unspecified chronic kidney disease: Secondary | ICD-10-CM | POA: Diagnosis not present

## 2019-08-15 DIAGNOSIS — Z942 Lung transplant status: Secondary | ICD-10-CM | POA: Diagnosis not present

## 2019-08-15 DIAGNOSIS — R918 Other nonspecific abnormal finding of lung field: Secondary | ICD-10-CM | POA: Diagnosis not present

## 2019-08-15 DIAGNOSIS — N401 Enlarged prostate with lower urinary tract symptoms: Secondary | ICD-10-CM | POA: Diagnosis not present

## 2019-08-15 DIAGNOSIS — R0602 Shortness of breath: Secondary | ICD-10-CM | POA: Diagnosis not present

## 2019-08-15 DIAGNOSIS — E871 Hypo-osmolality and hyponatremia: Secondary | ICD-10-CM | POA: Diagnosis not present

## 2019-08-15 DIAGNOSIS — E7849 Other hyperlipidemia: Secondary | ICD-10-CM | POA: Diagnosis not present

## 2019-08-15 DIAGNOSIS — I447 Left bundle-branch block, unspecified: Secondary | ICD-10-CM | POA: Diagnosis not present

## 2019-08-15 DIAGNOSIS — T86810 Lung transplant rejection: Secondary | ICD-10-CM | POA: Diagnosis not present

## 2019-08-15 DIAGNOSIS — I5023 Acute on chronic systolic (congestive) heart failure: Secondary | ICD-10-CM | POA: Diagnosis not present

## 2019-08-15 DIAGNOSIS — R0600 Dyspnea, unspecified: Secondary | ICD-10-CM | POA: Diagnosis not present

## 2019-08-15 DIAGNOSIS — R05 Cough: Secondary | ICD-10-CM | POA: Diagnosis not present

## 2019-08-15 DIAGNOSIS — D849 Immunodeficiency, unspecified: Secondary | ICD-10-CM | POA: Diagnosis not present

## 2019-08-15 DIAGNOSIS — R2243 Localized swelling, mass and lump, lower limb, bilateral: Secondary | ICD-10-CM | POA: Diagnosis not present

## 2019-08-16 DIAGNOSIS — I10 Essential (primary) hypertension: Secondary | ICD-10-CM | POA: Diagnosis not present

## 2019-08-16 DIAGNOSIS — Z20822 Contact with and (suspected) exposure to covid-19: Secondary | ICD-10-CM | POA: Diagnosis present

## 2019-08-16 DIAGNOSIS — D509 Iron deficiency anemia, unspecified: Secondary | ICD-10-CM | POA: Diagnosis present

## 2019-08-16 DIAGNOSIS — N183 Chronic kidney disease, stage 3 unspecified: Secondary | ICD-10-CM | POA: Diagnosis present

## 2019-08-16 DIAGNOSIS — R739 Hyperglycemia, unspecified: Secondary | ICD-10-CM | POA: Diagnosis not present

## 2019-08-16 DIAGNOSIS — Z87891 Personal history of nicotine dependence: Secondary | ICD-10-CM | POA: Diagnosis not present

## 2019-08-16 DIAGNOSIS — G4733 Obstructive sleep apnea (adult) (pediatric): Secondary | ICD-10-CM | POA: Diagnosis present

## 2019-08-16 DIAGNOSIS — I428 Other cardiomyopathies: Secondary | ICD-10-CM | POA: Diagnosis present

## 2019-08-16 DIAGNOSIS — E1122 Type 2 diabetes mellitus with diabetic chronic kidney disease: Secondary | ICD-10-CM | POA: Diagnosis present

## 2019-08-16 DIAGNOSIS — E119 Type 2 diabetes mellitus without complications: Secondary | ICD-10-CM | POA: Insufficient documentation

## 2019-08-16 DIAGNOSIS — E871 Hypo-osmolality and hyponatremia: Secondary | ICD-10-CM | POA: Diagnosis present

## 2019-08-16 DIAGNOSIS — E1165 Type 2 diabetes mellitus with hyperglycemia: Secondary | ICD-10-CM | POA: Diagnosis not present

## 2019-08-16 DIAGNOSIS — J42 Unspecified chronic bronchitis: Secondary | ICD-10-CM | POA: Diagnosis not present

## 2019-08-16 DIAGNOSIS — E7849 Other hyperlipidemia: Secondary | ICD-10-CM | POA: Diagnosis present

## 2019-08-16 DIAGNOSIS — K219 Gastro-esophageal reflux disease without esophagitis: Secondary | ICD-10-CM | POA: Diagnosis present

## 2019-08-16 DIAGNOSIS — N401 Enlarged prostate with lower urinary tract symptoms: Secondary | ICD-10-CM | POA: Diagnosis present

## 2019-08-16 DIAGNOSIS — Z79899 Other long term (current) drug therapy: Secondary | ICD-10-CM | POA: Diagnosis not present

## 2019-08-16 DIAGNOSIS — Z942 Lung transplant status: Secondary | ICD-10-CM | POA: Diagnosis not present

## 2019-08-16 DIAGNOSIS — I5022 Chronic systolic (congestive) heart failure: Secondary | ICD-10-CM | POA: Insufficient documentation

## 2019-08-16 DIAGNOSIS — I5021 Acute systolic (congestive) heart failure: Secondary | ICD-10-CM | POA: Diagnosis not present

## 2019-08-16 DIAGNOSIS — R0600 Dyspnea, unspecified: Secondary | ICD-10-CM | POA: Diagnosis not present

## 2019-08-16 DIAGNOSIS — Z5181 Encounter for therapeutic drug level monitoring: Secondary | ICD-10-CM | POA: Diagnosis not present

## 2019-08-16 DIAGNOSIS — T86810 Lung transplant rejection: Secondary | ICD-10-CM | POA: Diagnosis present

## 2019-08-16 DIAGNOSIS — D849 Immunodeficiency, unspecified: Secondary | ICD-10-CM | POA: Diagnosis present

## 2019-08-16 DIAGNOSIS — Z7982 Long term (current) use of aspirin: Secondary | ICD-10-CM | POA: Diagnosis not present

## 2019-08-16 DIAGNOSIS — J479 Bronchiectasis, uncomplicated: Secondary | ICD-10-CM | POA: Diagnosis not present

## 2019-08-16 DIAGNOSIS — I130 Hypertensive heart and chronic kidney disease with heart failure and stage 1 through stage 4 chronic kidney disease, or unspecified chronic kidney disease: Secondary | ICD-10-CM | POA: Diagnosis present

## 2019-08-16 DIAGNOSIS — I447 Left bundle-branch block, unspecified: Secondary | ICD-10-CM | POA: Diagnosis present

## 2019-08-16 DIAGNOSIS — E1169 Type 2 diabetes mellitus with other specified complication: Secondary | ICD-10-CM | POA: Diagnosis not present

## 2019-08-16 DIAGNOSIS — Z9989 Dependence on other enabling machines and devices: Secondary | ICD-10-CM | POA: Diagnosis not present

## 2019-08-16 DIAGNOSIS — R918 Other nonspecific abnormal finding of lung field: Secondary | ICD-10-CM | POA: Diagnosis not present

## 2019-08-16 DIAGNOSIS — R0602 Shortness of breath: Secondary | ICD-10-CM | POA: Diagnosis not present

## 2019-08-16 DIAGNOSIS — I5023 Acute on chronic systolic (congestive) heart failure: Secondary | ICD-10-CM | POA: Diagnosis present

## 2019-08-16 DIAGNOSIS — G4700 Insomnia, unspecified: Secondary | ICD-10-CM | POA: Diagnosis not present

## 2019-08-28 DIAGNOSIS — Z5181 Encounter for therapeutic drug level monitoring: Secondary | ICD-10-CM | POA: Diagnosis not present

## 2019-08-28 DIAGNOSIS — T86818 Other complications of lung transplant: Secondary | ICD-10-CM | POA: Diagnosis not present

## 2019-09-04 ENCOUNTER — Other Ambulatory Visit: Payer: Self-pay | Admitting: Adult Health

## 2019-09-05 DIAGNOSIS — T86810 Lung transplant rejection: Secondary | ICD-10-CM | POA: Diagnosis not present

## 2019-09-05 DIAGNOSIS — J42 Unspecified chronic bronchitis: Secondary | ICD-10-CM | POA: Diagnosis not present

## 2019-09-05 DIAGNOSIS — Z942 Lung transplant status: Secondary | ICD-10-CM | POA: Diagnosis not present

## 2019-09-05 DIAGNOSIS — Z006 Encounter for examination for normal comparison and control in clinical research program: Secondary | ICD-10-CM | POA: Diagnosis not present

## 2019-09-06 DIAGNOSIS — Z006 Encounter for examination for normal comparison and control in clinical research program: Secondary | ICD-10-CM | POA: Diagnosis not present

## 2019-09-06 DIAGNOSIS — J42 Unspecified chronic bronchitis: Secondary | ICD-10-CM | POA: Diagnosis not present

## 2019-09-06 DIAGNOSIS — T86810 Lung transplant rejection: Secondary | ICD-10-CM | POA: Diagnosis not present

## 2019-09-09 DIAGNOSIS — E1121 Type 2 diabetes mellitus with diabetic nephropathy: Secondary | ICD-10-CM | POA: Diagnosis not present

## 2019-09-09 DIAGNOSIS — Z794 Long term (current) use of insulin: Secondary | ICD-10-CM | POA: Diagnosis not present

## 2019-09-09 DIAGNOSIS — N1832 Chronic kidney disease, stage 3b: Secondary | ICD-10-CM | POA: Diagnosis not present

## 2019-09-12 DIAGNOSIS — I5023 Acute on chronic systolic (congestive) heart failure: Secondary | ICD-10-CM | POA: Diagnosis not present

## 2019-09-12 DIAGNOSIS — M6080 Other myositis, unspecified site: Secondary | ICD-10-CM | POA: Diagnosis not present

## 2019-09-12 DIAGNOSIS — I129 Hypertensive chronic kidney disease with stage 1 through stage 4 chronic kidney disease, or unspecified chronic kidney disease: Secondary | ICD-10-CM | POA: Diagnosis not present

## 2019-09-12 DIAGNOSIS — Z942 Lung transplant status: Secondary | ICD-10-CM | POA: Diagnosis not present

## 2019-09-12 DIAGNOSIS — E785 Hyperlipidemia, unspecified: Secondary | ICD-10-CM | POA: Diagnosis not present

## 2019-09-12 DIAGNOSIS — G4733 Obstructive sleep apnea (adult) (pediatric): Secondary | ICD-10-CM | POA: Diagnosis not present

## 2019-09-12 DIAGNOSIS — R0600 Dyspnea, unspecified: Secondary | ICD-10-CM | POA: Diagnosis not present

## 2019-09-12 DIAGNOSIS — N189 Chronic kidney disease, unspecified: Secondary | ICD-10-CM | POA: Diagnosis not present

## 2019-09-12 DIAGNOSIS — I447 Left bundle-branch block, unspecified: Secondary | ICD-10-CM | POA: Diagnosis not present

## 2019-09-12 DIAGNOSIS — Z79899 Other long term (current) drug therapy: Secondary | ICD-10-CM | POA: Diagnosis not present

## 2019-09-12 DIAGNOSIS — E1122 Type 2 diabetes mellitus with diabetic chronic kidney disease: Secondary | ICD-10-CM | POA: Diagnosis not present

## 2019-09-12 DIAGNOSIS — J849 Interstitial pulmonary disease, unspecified: Secondary | ICD-10-CM | POA: Diagnosis not present

## 2019-09-12 DIAGNOSIS — E8770 Fluid overload, unspecified: Secondary | ICD-10-CM | POA: Diagnosis not present

## 2019-09-17 DIAGNOSIS — E119 Type 2 diabetes mellitus without complications: Secondary | ICD-10-CM | POA: Diagnosis not present

## 2019-09-19 DIAGNOSIS — G4733 Obstructive sleep apnea (adult) (pediatric): Secondary | ICD-10-CM | POA: Diagnosis not present

## 2019-09-19 DIAGNOSIS — Z9989 Dependence on other enabling machines and devices: Secondary | ICD-10-CM | POA: Diagnosis not present

## 2019-09-19 DIAGNOSIS — E119 Type 2 diabetes mellitus without complications: Secondary | ICD-10-CM | POA: Diagnosis not present

## 2019-09-19 DIAGNOSIS — Z4502 Encounter for adjustment and management of automatic implantable cardiac defibrillator: Secondary | ICD-10-CM | POA: Diagnosis not present

## 2019-09-19 DIAGNOSIS — N289 Disorder of kidney and ureter, unspecified: Secondary | ICD-10-CM | POA: Diagnosis not present

## 2019-09-19 DIAGNOSIS — I5023 Acute on chronic systolic (congestive) heart failure: Secondary | ICD-10-CM | POA: Diagnosis not present

## 2019-09-19 DIAGNOSIS — J849 Interstitial pulmonary disease, unspecified: Secondary | ICD-10-CM | POA: Diagnosis not present

## 2019-09-24 DIAGNOSIS — N289 Disorder of kidney and ureter, unspecified: Secondary | ICD-10-CM | POA: Diagnosis not present

## 2019-09-24 DIAGNOSIS — Z5181 Encounter for therapeutic drug level monitoring: Secondary | ICD-10-CM | POA: Diagnosis not present

## 2019-09-24 DIAGNOSIS — T86818 Other complications of lung transplant: Secondary | ICD-10-CM | POA: Diagnosis not present

## 2019-10-03 DIAGNOSIS — I447 Left bundle-branch block, unspecified: Secondary | ICD-10-CM | POA: Diagnosis not present

## 2019-10-03 DIAGNOSIS — Z79899 Other long term (current) drug therapy: Secondary | ICD-10-CM | POA: Diagnosis not present

## 2019-10-03 DIAGNOSIS — J42 Unspecified chronic bronchitis: Secondary | ICD-10-CM | POA: Diagnosis not present

## 2019-10-03 DIAGNOSIS — T86810 Lung transplant rejection: Secondary | ICD-10-CM | POA: Diagnosis not present

## 2019-10-03 DIAGNOSIS — Z5181 Encounter for therapeutic drug level monitoring: Secondary | ICD-10-CM | POA: Diagnosis not present

## 2019-10-03 DIAGNOSIS — Z006 Encounter for examination for normal comparison and control in clinical research program: Secondary | ICD-10-CM | POA: Diagnosis not present

## 2019-10-04 DIAGNOSIS — Z79899 Other long term (current) drug therapy: Secondary | ICD-10-CM | POA: Diagnosis not present

## 2019-10-04 DIAGNOSIS — J42 Unspecified chronic bronchitis: Secondary | ICD-10-CM | POA: Diagnosis not present

## 2019-10-04 DIAGNOSIS — T86810 Lung transplant rejection: Secondary | ICD-10-CM | POA: Diagnosis not present

## 2019-10-04 DIAGNOSIS — Z006 Encounter for examination for normal comparison and control in clinical research program: Secondary | ICD-10-CM | POA: Diagnosis not present

## 2019-10-16 DIAGNOSIS — Z9989 Dependence on other enabling machines and devices: Secondary | ICD-10-CM | POA: Diagnosis not present

## 2019-10-16 DIAGNOSIS — I5023 Acute on chronic systolic (congestive) heart failure: Secondary | ICD-10-CM | POA: Diagnosis not present

## 2019-10-16 DIAGNOSIS — G4733 Obstructive sleep apnea (adult) (pediatric): Secondary | ICD-10-CM | POA: Diagnosis not present

## 2019-10-16 DIAGNOSIS — Z01810 Encounter for preprocedural cardiovascular examination: Secondary | ICD-10-CM | POA: Diagnosis not present

## 2019-10-16 DIAGNOSIS — Z20822 Contact with and (suspected) exposure to covid-19: Secondary | ICD-10-CM | POA: Diagnosis not present

## 2019-10-16 DIAGNOSIS — R0600 Dyspnea, unspecified: Secondary | ICD-10-CM | POA: Diagnosis not present

## 2019-10-18 DIAGNOSIS — I110 Hypertensive heart disease with heart failure: Secondary | ICD-10-CM | POA: Diagnosis not present

## 2019-10-18 DIAGNOSIS — Z79899 Other long term (current) drug therapy: Secondary | ICD-10-CM | POA: Diagnosis not present

## 2019-10-18 DIAGNOSIS — Z452 Encounter for adjustment and management of vascular access device: Secondary | ICD-10-CM | POA: Diagnosis not present

## 2019-10-18 DIAGNOSIS — Z95810 Presence of automatic (implantable) cardiac defibrillator: Secondary | ICD-10-CM | POA: Insufficient documentation

## 2019-10-18 DIAGNOSIS — Z794 Long term (current) use of insulin: Secondary | ICD-10-CM | POA: Diagnosis not present

## 2019-10-18 DIAGNOSIS — E119 Type 2 diabetes mellitus without complications: Secondary | ICD-10-CM | POA: Diagnosis not present

## 2019-10-18 DIAGNOSIS — I447 Left bundle-branch block, unspecified: Secondary | ICD-10-CM | POA: Diagnosis not present

## 2019-10-18 DIAGNOSIS — Z942 Lung transplant status: Secondary | ICD-10-CM | POA: Diagnosis not present

## 2019-10-18 DIAGNOSIS — I5023 Acute on chronic systolic (congestive) heart failure: Secondary | ICD-10-CM | POA: Diagnosis not present

## 2019-10-18 DIAGNOSIS — D84821 Immunodeficiency due to drugs: Secondary | ICD-10-CM | POA: Diagnosis not present

## 2019-10-18 DIAGNOSIS — Z7952 Long term (current) use of systemic steroids: Secondary | ICD-10-CM | POA: Diagnosis not present

## 2019-10-18 DIAGNOSIS — I428 Other cardiomyopathies: Secondary | ICD-10-CM | POA: Diagnosis not present

## 2019-10-18 HISTORY — PX: CARDIAC DEFIBRILLATOR PLACEMENT: SHX171

## 2019-10-18 HISTORY — DX: Presence of automatic (implantable) cardiac defibrillator: Z95.810

## 2019-10-19 DIAGNOSIS — I447 Left bundle-branch block, unspecified: Secondary | ICD-10-CM | POA: Diagnosis not present

## 2019-10-19 DIAGNOSIS — Z942 Lung transplant status: Secondary | ICD-10-CM | POA: Diagnosis not present

## 2019-10-19 DIAGNOSIS — I5022 Chronic systolic (congestive) heart failure: Secondary | ICD-10-CM | POA: Diagnosis not present

## 2019-10-19 DIAGNOSIS — I5023 Acute on chronic systolic (congestive) heart failure: Secondary | ICD-10-CM | POA: Diagnosis not present

## 2019-10-19 DIAGNOSIS — E119 Type 2 diabetes mellitus without complications: Secondary | ICD-10-CM | POA: Diagnosis not present

## 2019-10-19 DIAGNOSIS — D84821 Immunodeficiency due to drugs: Secondary | ICD-10-CM | POA: Diagnosis not present

## 2019-10-19 DIAGNOSIS — I110 Hypertensive heart disease with heart failure: Secondary | ICD-10-CM | POA: Diagnosis not present

## 2019-10-19 DIAGNOSIS — Z95810 Presence of automatic (implantable) cardiac defibrillator: Secondary | ICD-10-CM | POA: Diagnosis not present

## 2019-10-19 DIAGNOSIS — I428 Other cardiomyopathies: Secondary | ICD-10-CM | POA: Diagnosis not present

## 2019-10-31 DIAGNOSIS — T86810 Lung transplant rejection: Secondary | ICD-10-CM | POA: Diagnosis not present

## 2019-10-31 DIAGNOSIS — Z006 Encounter for examination for normal comparison and control in clinical research program: Secondary | ICD-10-CM | POA: Diagnosis not present

## 2019-10-31 DIAGNOSIS — J42 Unspecified chronic bronchitis: Secondary | ICD-10-CM | POA: Diagnosis not present

## 2019-11-04 DIAGNOSIS — T86818 Other complications of lung transplant: Secondary | ICD-10-CM | POA: Diagnosis not present

## 2019-11-04 DIAGNOSIS — Z5181 Encounter for therapeutic drug level monitoring: Secondary | ICD-10-CM | POA: Diagnosis not present

## 2019-11-04 DIAGNOSIS — Z79899 Other long term (current) drug therapy: Secondary | ICD-10-CM | POA: Diagnosis not present

## 2019-11-11 DIAGNOSIS — T86818 Other complications of lung transplant: Secondary | ICD-10-CM | POA: Diagnosis not present

## 2019-11-11 DIAGNOSIS — Z5181 Encounter for therapeutic drug level monitoring: Secondary | ICD-10-CM | POA: Diagnosis not present

## 2019-11-14 ENCOUNTER — Telehealth: Payer: Self-pay

## 2019-11-14 NOTE — Telephone Encounter (Signed)
Called lmom informing patient of appointment on 11/18/2019. klh 

## 2019-11-18 ENCOUNTER — Ambulatory Visit: Payer: Medicare Other | Admitting: Adult Health

## 2019-11-25 ENCOUNTER — Telehealth: Payer: Self-pay

## 2019-11-25 DIAGNOSIS — G4733 Obstructive sleep apnea (adult) (pediatric): Secondary | ICD-10-CM | POA: Diagnosis not present

## 2019-11-25 DIAGNOSIS — E785 Hyperlipidemia, unspecified: Secondary | ICD-10-CM | POA: Diagnosis not present

## 2019-11-25 DIAGNOSIS — T86818 Other complications of lung transplant: Secondary | ICD-10-CM | POA: Diagnosis not present

## 2019-11-25 DIAGNOSIS — D84821 Immunodeficiency due to drugs: Secondary | ICD-10-CM | POA: Diagnosis not present

## 2019-11-25 DIAGNOSIS — J8410 Pulmonary fibrosis, unspecified: Secondary | ICD-10-CM | POA: Diagnosis not present

## 2019-11-25 DIAGNOSIS — N183 Chronic kidney disease, stage 3 unspecified: Secondary | ICD-10-CM | POA: Diagnosis not present

## 2019-11-25 DIAGNOSIS — Z006 Encounter for examination for normal comparison and control in clinical research program: Secondary | ICD-10-CM | POA: Diagnosis not present

## 2019-11-25 DIAGNOSIS — Z79899 Other long term (current) drug therapy: Secondary | ICD-10-CM | POA: Diagnosis not present

## 2019-11-25 DIAGNOSIS — E1122 Type 2 diabetes mellitus with diabetic chronic kidney disease: Secondary | ICD-10-CM | POA: Diagnosis not present

## 2019-11-25 DIAGNOSIS — I130 Hypertensive heart and chronic kidney disease with heart failure and stage 1 through stage 4 chronic kidney disease, or unspecified chronic kidney disease: Secondary | ICD-10-CM | POA: Diagnosis not present

## 2019-11-25 DIAGNOSIS — K219 Gastro-esophageal reflux disease without esophagitis: Secondary | ICD-10-CM | POA: Diagnosis not present

## 2019-11-25 DIAGNOSIS — Z942 Lung transplant status: Secondary | ICD-10-CM | POA: Diagnosis not present

## 2019-11-25 DIAGNOSIS — I2510 Atherosclerotic heart disease of native coronary artery without angina pectoris: Secondary | ICD-10-CM | POA: Diagnosis not present

## 2019-11-25 DIAGNOSIS — I509 Heart failure, unspecified: Secondary | ICD-10-CM | POA: Diagnosis not present

## 2019-11-25 DIAGNOSIS — Z5181 Encounter for therapeutic drug level monitoring: Secondary | ICD-10-CM | POA: Diagnosis not present

## 2019-11-25 DIAGNOSIS — N400 Enlarged prostate without lower urinary tract symptoms: Secondary | ICD-10-CM | POA: Diagnosis not present

## 2019-11-25 NOTE — Telephone Encounter (Signed)
Confirmed appointment on 11/27/2019 and screened for covid. klh

## 2019-11-27 ENCOUNTER — Other Ambulatory Visit: Payer: Self-pay

## 2019-11-27 ENCOUNTER — Encounter: Payer: Self-pay | Admitting: Adult Health

## 2019-11-27 ENCOUNTER — Ambulatory Visit (INDEPENDENT_AMBULATORY_CARE_PROVIDER_SITE_OTHER): Payer: Medicare Other | Admitting: Adult Health

## 2019-11-27 VITALS — BP 125/67 | HR 93 | Temp 97.6°F | Resp 16 | Ht 69.0 in | Wt 197.6 lb

## 2019-11-27 DIAGNOSIS — E119 Type 2 diabetes mellitus without complications: Secondary | ICD-10-CM

## 2019-11-27 DIAGNOSIS — E785 Hyperlipidemia, unspecified: Secondary | ICD-10-CM

## 2019-11-27 DIAGNOSIS — Z942 Lung transplant status: Secondary | ICD-10-CM | POA: Diagnosis not present

## 2019-11-27 DIAGNOSIS — I10 Essential (primary) hypertension: Secondary | ICD-10-CM | POA: Diagnosis not present

## 2019-11-27 LAB — POCT GLYCOSYLATED HEMOGLOBIN (HGB A1C): Hemoglobin A1C: 7.9 % — AB (ref 4.0–5.6)

## 2019-11-27 MED ORDER — ONETOUCH VERIO VI STRP: ORAL_STRIP | 3 refills | Status: DC

## 2019-11-27 NOTE — Progress Notes (Signed)
[facility] [place], [place] [address]  Internal MEDICINE  Office Visit Note  Patient Name: Caleb [name]  [id number]  [id number]  Date of Service: 11/27/2019  Chief Complaint  Patient presents with  . Follow-up  . Hyperlipidemia  . Hypertension    HPI  Pt is here for follow up on HTN, HLD, and DM. He reports 6 weeks ago he had a pacemaker/defibrillator placed. He continues to have swelling in his feet, and has not noticed much difference since its insertion.  His bp today is 125/67 and his Hr was in the low 90's.  He Denies Chest pain, palpitations, headache, or blurred vision. He continues to have his baseline sob.  He had a single lung transplant in 2016.  He continues to see transplant services at [facility].  His A1C today is 7.9.  It was 8.0 at last visit.  He reports his sugars have been around 80-105 most mornings. He is on chronic steroids for his transplant rejection.        Current Medication: Outpatient Encounter Medications as of 11/27/2019  Medication Sig  . acetaminophen (TYLENOL) 325 MG tablet Take 650 mg by mouth every 6 (six) hours as needed.  Marland Kitchen albuterol (PROVENTIL) (2.5 MG/3ML) 0.083% nebulizer solution Inhale into the lungs.  Marland Kitchen azithromycin (ZITHROMAX) 250 MG tablet Take by mouth daily.  . Cholecalciferol (VITAMIN D3) 1000 units CAPS Take by mouth.  . dapagliflozin propanediol (FARXIGA) 10 MG TABS tablet Take 1 tablet by mouth 1 day or 1 dose.  . fexofenadine (ALLEGRA) 180 MG tablet Take 180 mg by mouth daily.  . furosemide (LASIX) 20 MG tablet Take 20 mg by mouth. One tablet by mouth three times a week Sunday , Tuesday , friday  . glipiZIDE (GLUCOTROL) 5 MG tablet TK 1 T PO BID B MEALS  . glucose blood (ONETOUCH VERIO) test strip TEST BLOOD SUGARS TWICE DAILY. DX E11.65.  Marland Kitchen Immune Globulin 10% (GAMUNEX-C) 10 GM/100ML SOLN Inject into the vein. Inject 40 g into the vein monthly  . Isavuconazonium Sulfate (CRESEMBA) 186 MG CAPS Take by  mouth.  . Lancets 28G MISC Use as instructed QID  . lovastatin (MEVACOR) 20 MG tablet Take 20 mg by mouth daily.  . Melatonin (MELATONIN MAXIMUM STRENGTH) 5 MG TABS Take by mouth.  . metoprolol succinate (TOPROL-XL) 50 MG 24 hr tablet Take 1 tablet by mouth 1 day or 1 dose.  . metoprolol tartrate (LOPRESSOR) 25 MG tablet Take 1/2 to 1 tablet po BID  . Multiple Vitamin (MULTI-VITAMINS) TABS Take by mouth.  . mycophenolate (CELLCEPT) 250 MG capsule Take by mouth 2 (two) times daily. Take four capsules by mouth every twelve hours  . pantoprazole (PROTONIX) 40 MG tablet Take 40 mg by mouth 2 (two) times daily.   . predniSONE (DELTASONE) 5 MG tablet TK 1 T PO  QD  . sodium chloride HYPERTONIC 3 % nebulizer solution Inhale 63mls via nebulizer twice daily, following albuterol neb.  . sulfamethoxazole-trimethoprim (BACTRIM) 400-80 MG tablet Take 1 tablet by mouth 3 (three) times a week.  . tacrolimus (PROGRAF) 0.5 MG capsule 02/01/18: Take in combination with 1 mg tablets for a total of 1 mg by mouth in the morning, and 0.5 mg in the evening.  . tacrolimus (PROGRAF) 1 MG capsule 02/01/18: Take in combination with 0.5 mg tablets for a total of 1 mg by mouth in the morning, and 0.5 mg in the evening.  . tamsulosin (FLOMAX) 0.4 MG CAPS Take 0.4  mg by mouth daily after breakfast. 1 on saturdays  . valGANciclovir (VALCYTE) 450 MG tablet Take 450 mg by mouth every other day.  . [DISCONTINUED] aspirin 81 MG tablet Take 81 mg by mouth daily.  Marland Kitchen albuterol (VENTOLIN HFA) 108 (90 Base) MCG/ACT inhaler Inhale into the lungs.  . budesonide-formoterol (SYMBICORT) 160-4.5 MCG/ACT inhaler Inhale into the lungs.   No facility-administered encounter medications on file as of 11/27/2019.    Surgical History: Past Surgical History:  Procedure Laterality Date  . LUNG TRANSPLANT      Medical History: Past Medical History:  Diagnosis Date  . BPH (benign prostatic hyperplasia)   . Chest pain, unspecified   . Diabetes  mellitus without complication ([place])   . Dyspnea   . Hyperlipidemia   . Hypertension     Family History: Family History  Problem Relation Age of Onset  . Coronary artery disease Other   . Diabetes Other     Social History   Socioeconomic History  . Marital status: Married    Spouse name: Not on file  . Number of children: Not on file  . Years of education: Not on file  . Highest education level: Not on file  Occupational History  . Not on file  Tobacco Use  . Smoking status: Former Smoker    Types: Cigarettes    Quit date: 05/16/1979    Years since quitting: 40.5  . Smokeless tobacco: Never Used  . Tobacco comment: Quit 1980  Vaping Use  . Vaping Use: Never used  Substance and Sexual Activity  . Alcohol use: Not Currently  . Drug use: No  . Sexual activity: Not on file  Other Topics Concern  . 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    Social Determinants of Health   Financial Resource Strain:   . Difficulty of Paying Living Expenses:   Food Insecurity:   . Worried About Charity fundraiser in the Last Year:   . Arboriculturist in the Last Year:   Transportation Needs:   . Film/video editor (Medical):   Marland Kitchen Lack of Transportation (Non-Medical):   Physical Activity:   . Days of Exercise per Week:   . Minutes of Exercise per Session:   Stress:   . Feeling of Stress :   Social Connections:   . Frequency of Communication with Friends and Family:   . Frequency of Social Gatherings with Friends and Family:   . Attends Religious Services:   . Active Member of Clubs or Organizations:   . Attends Archivist Meetings:   Marland Kitchen Marital Status:   Intimate Partner Violence:   . Fear of Current or Ex-Partner:   . Emotionally Abused:   Marland Kitchen Physically Abused:   . Sexually Abused:       Review of Systems  Constitutional: Negative.  Negative for chills, fatigue and unexpected weight change.  HENT:  Negative.  Negative for congestion, rhinorrhea, sneezing and sore throat.   Eyes: Negative for redness.  Respiratory: Negative.  Negative for cough, chest tightness and shortness of breath.   Cardiovascular: Positive for leg swelling. Negative for chest pain and palpitations.  Gastrointestinal: Negative.  Negative for abdominal pain, constipation, diarrhea, nausea and vomiting.  Endocrine: Negative.   Genitourinary: Negative.  Negative for dysuria and frequency.  Musculoskeletal: Negative.  Negative for arthralgias, back pain, joint swelling and neck pain.  Skin: Negative.  Negative for rash.  Allergic/Immunologic: Negative.   Neurological: Negative.  Negative for tremors and numbness.  Hematological: Negative for adenopathy. Does not bruise/bleed easily.  Psychiatric/Behavioral: Negative.  Negative for behavioral problems, sleep disturbance and suicidal ideas. The patient is not nervous/anxious.     Vital Signs: BP 125/67   Pulse 93   Temp 97.6 F (36.4 C)   Resp 16   Ht 5\' 9"  (1.753 m)   Wt 197 lb 9.6 oz (89.6 kg)   SpO2 97%   BMI 29.18 kg/m    Physical Exam Vitals and nursing note reviewed.  Constitutional:      General: He is not in acute distress.    Appearance: He is well-developed. He is not diaphoretic.  HENT:     Head: Normocephalic and atraumatic.     Mouth/Throat:     Pharynx: No oropharyngeal exudate.  Eyes:     Pupils: Pupils are equal, round, and reactive to light.  Neck:     Thyroid: No thyromegaly.     Vascular: No JVD.     Trachea: No tracheal deviation.  Cardiovascular:     Rate and Rhythm: Normal rate and regular rhythm.     Heart sounds: Normal heart sounds. No murmur heard.  No friction rub. No gallop.   Pulmonary:     Effort: Pulmonary effort is normal. No respiratory distress.     Breath sounds: Normal breath sounds. No wheezing or rales.  Chest:     Chest wall: No tenderness.  Abdominal:     Palpations: Abdomen is soft.     Tenderness:  There is no abdominal tenderness. There is no guarding.  Musculoskeletal:        General: Normal range of motion.     Cervical back: Normal range of motion and neck supple.  Lymphadenopathy:     Cervical: No cervical adenopathy.  Skin:    General: Skin is warm and dry.  Neurological:     Mental Status: He is alert and oriented to person, place, and time.     Cranial Nerves: No cranial nerve deficit.  Psychiatric:        Behavior: Behavior normal.        Thought Content: Thought content normal.        Judgment: Judgment normal.    Assessment/Plan: 1. Diabetes mellitus without complication (HCC) O9G is stable at 7.9  - POCT HgB A1C  2. Essential hypertension bp is stable, continue to follow up with cardiology about heart failure, and new pacemake/defib placement.   3. H/O lung transplant ([place]) Chronic rejection, continue to follow with transplant team.  4. Hyperlipidemia, unspecified hyperlipidemia type Continue current management.  General Counseling: travell desaulniers understanding of the findings of todays visit and agrees with plan of treatment. I have discussed any further diagnostic evaluation that may be needed or ordered today. We also reviewed his medications today. he has been encouraged to call the office with any questions or concerns that should arise related to todays visit.    Orders Placed This Encounter  Procedures  . POCT HgB A1C    No orders of the defined types were placed in this encounter.   Time spent: 30 Minutes   This patient was seen by Orson Gear AGNP-C in Collaboration with Dr Lavera Guise as a part of collaborative care agreement     Kendell Bane AGNP-C Internal medicine

## 2019-11-28 DIAGNOSIS — Z006 Encounter for examination for normal comparison and control in clinical research program: Secondary | ICD-10-CM | POA: Diagnosis not present

## 2019-11-28 DIAGNOSIS — T86810 Lung transplant rejection: Secondary | ICD-10-CM | POA: Diagnosis not present

## 2019-11-28 DIAGNOSIS — Z942 Lung transplant status: Secondary | ICD-10-CM | POA: Diagnosis not present

## 2019-11-29 DIAGNOSIS — T86810 Lung transplant rejection: Secondary | ICD-10-CM | POA: Diagnosis not present

## 2019-11-29 DIAGNOSIS — J42 Unspecified chronic bronchitis: Secondary | ICD-10-CM | POA: Diagnosis not present

## 2019-11-29 DIAGNOSIS — Z006 Encounter for examination for normal comparison and control in clinical research program: Secondary | ICD-10-CM | POA: Diagnosis not present

## 2019-12-10 DIAGNOSIS — T86818 Other complications of lung transplant: Secondary | ICD-10-CM | POA: Diagnosis not present

## 2019-12-10 DIAGNOSIS — Z5181 Encounter for therapeutic drug level monitoring: Secondary | ICD-10-CM | POA: Diagnosis not present

## 2019-12-16 DIAGNOSIS — N1832 Chronic kidney disease, stage 3b: Secondary | ICD-10-CM | POA: Diagnosis not present

## 2019-12-16 DIAGNOSIS — E1122 Type 2 diabetes mellitus with diabetic chronic kidney disease: Secondary | ICD-10-CM | POA: Diagnosis not present

## 2019-12-16 DIAGNOSIS — Z794 Long term (current) use of insulin: Secondary | ICD-10-CM | POA: Diagnosis not present

## 2019-12-26 DIAGNOSIS — I428 Other cardiomyopathies: Secondary | ICD-10-CM | POA: Diagnosis not present

## 2019-12-26 DIAGNOSIS — I447 Left bundle-branch block, unspecified: Secondary | ICD-10-CM | POA: Diagnosis not present

## 2019-12-26 DIAGNOSIS — T86810 Lung transplant rejection: Secondary | ICD-10-CM | POA: Diagnosis not present

## 2019-12-26 DIAGNOSIS — I110 Hypertensive heart disease with heart failure: Secondary | ICD-10-CM | POA: Diagnosis not present

## 2019-12-26 DIAGNOSIS — I5023 Acute on chronic systolic (congestive) heart failure: Secondary | ICD-10-CM | POA: Diagnosis not present

## 2019-12-26 DIAGNOSIS — Z4502 Encounter for adjustment and management of automatic implantable cardiac defibrillator: Secondary | ICD-10-CM | POA: Diagnosis not present

## 2019-12-26 DIAGNOSIS — Z942 Lung transplant status: Secondary | ICD-10-CM | POA: Diagnosis not present

## 2019-12-26 DIAGNOSIS — Z006 Encounter for examination for normal comparison and control in clinical research program: Secondary | ICD-10-CM | POA: Diagnosis not present

## 2019-12-27 DIAGNOSIS — T86810 Lung transplant rejection: Secondary | ICD-10-CM | POA: Diagnosis not present

## 2019-12-27 DIAGNOSIS — J42 Unspecified chronic bronchitis: Secondary | ICD-10-CM | POA: Diagnosis not present

## 2019-12-27 DIAGNOSIS — Z006 Encounter for examination for normal comparison and control in clinical research program: Secondary | ICD-10-CM | POA: Diagnosis not present

## 2019-12-29 HISTORY — PX: HIP FRACTURE SURGERY: SHX118

## 2020-01-02 DIAGNOSIS — I5020 Unspecified systolic (congestive) heart failure: Secondary | ICD-10-CM | POA: Diagnosis not present

## 2020-01-02 DIAGNOSIS — Z95810 Presence of automatic (implantable) cardiac defibrillator: Secondary | ICD-10-CM | POA: Diagnosis not present

## 2020-01-02 DIAGNOSIS — Z79899 Other long term (current) drug therapy: Secondary | ICD-10-CM | POA: Diagnosis not present

## 2020-01-02 DIAGNOSIS — G4733 Obstructive sleep apnea (adult) (pediatric): Secondary | ICD-10-CM | POA: Diagnosis not present

## 2020-01-02 DIAGNOSIS — Z942 Lung transplant status: Secondary | ICD-10-CM | POA: Diagnosis not present

## 2020-01-02 DIAGNOSIS — E785 Hyperlipidemia, unspecified: Secondary | ICD-10-CM | POA: Diagnosis not present

## 2020-01-02 DIAGNOSIS — I361 Nonrheumatic tricuspid (valve) insufficiency: Secondary | ICD-10-CM | POA: Diagnosis not present

## 2020-01-02 DIAGNOSIS — I428 Other cardiomyopathies: Secondary | ICD-10-CM | POA: Diagnosis not present

## 2020-01-02 DIAGNOSIS — I447 Left bundle-branch block, unspecified: Secondary | ICD-10-CM | POA: Diagnosis not present

## 2020-01-02 DIAGNOSIS — I5022 Chronic systolic (congestive) heart failure: Secondary | ICD-10-CM | POA: Diagnosis not present

## 2020-01-02 DIAGNOSIS — R0600 Dyspnea, unspecified: Secondary | ICD-10-CM | POA: Diagnosis not present

## 2020-01-02 DIAGNOSIS — D84821 Immunodeficiency due to drugs: Secondary | ICD-10-CM | POA: Diagnosis not present

## 2020-01-02 DIAGNOSIS — R600 Localized edema: Secondary | ICD-10-CM | POA: Diagnosis not present

## 2020-01-09 DIAGNOSIS — I5020 Unspecified systolic (congestive) heart failure: Secondary | ICD-10-CM | POA: Diagnosis not present

## 2020-01-13 ENCOUNTER — Encounter: Payer: Self-pay | Admitting: Emergency Medicine

## 2020-01-13 ENCOUNTER — Inpatient Hospital Stay
Admission: EM | Admit: 2020-01-13 | Discharge: 2020-01-19 | DRG: 535 | Disposition: A | Discharge status: Other Acute Inpatient Hospital | Payer: Medicare Other | Attending: Internal Medicine | Admitting: Internal Medicine

## 2020-01-13 ENCOUNTER — Emergency Department: Payer: Medicare Other

## 2020-01-13 DIAGNOSIS — E1100 Type 2 diabetes mellitus with hyperosmolarity without nonketotic hyperglycemic-hyperosmolar coma (NKHHC): Secondary | ICD-10-CM | POA: Diagnosis not present

## 2020-01-13 DIAGNOSIS — S51011A Laceration without foreign body of right elbow, initial encounter: Secondary | ICD-10-CM | POA: Diagnosis present

## 2020-01-13 DIAGNOSIS — R0902 Hypoxemia: Secondary | ICD-10-CM | POA: Diagnosis not present

## 2020-01-13 DIAGNOSIS — Z20822 Contact with and (suspected) exposure to covid-19: Secondary | ICD-10-CM | POA: Diagnosis present

## 2020-01-13 DIAGNOSIS — Y92008 Other place in unspecified non-institutional (private) residence as the place of occurrence of the external cause: Secondary | ICD-10-CM

## 2020-01-13 DIAGNOSIS — Z87891 Personal history of nicotine dependence: Secondary | ICD-10-CM

## 2020-01-13 DIAGNOSIS — Z79899 Other long term (current) drug therapy: Secondary | ICD-10-CM | POA: Diagnosis not present

## 2020-01-13 DIAGNOSIS — S72091A Other fracture of head and neck of right femur, initial encounter for closed fracture: Secondary | ICD-10-CM | POA: Diagnosis present

## 2020-01-13 DIAGNOSIS — N179 Acute kidney failure, unspecified: Secondary | ICD-10-CM | POA: Diagnosis present

## 2020-01-13 DIAGNOSIS — Z7952 Long term (current) use of systemic steroids: Secondary | ICD-10-CM

## 2020-01-13 DIAGNOSIS — I472 Ventricular tachycardia: Secondary | ICD-10-CM | POA: Diagnosis present

## 2020-01-13 DIAGNOSIS — W1830XA Fall on same level, unspecified, initial encounter: Secondary | ICD-10-CM | POA: Diagnosis present

## 2020-01-13 DIAGNOSIS — Z794 Long term (current) use of insulin: Secondary | ICD-10-CM

## 2020-01-13 DIAGNOSIS — J9601 Acute respiratory failure with hypoxia: Secondary | ICD-10-CM | POA: Diagnosis present

## 2020-01-13 DIAGNOSIS — N1832 Chronic kidney disease, stage 3b: Secondary | ICD-10-CM | POA: Diagnosis present

## 2020-01-13 DIAGNOSIS — R52 Pain, unspecified: Secondary | ICD-10-CM | POA: Diagnosis not present

## 2020-01-13 DIAGNOSIS — J440 Chronic obstructive pulmonary disease with acute lower respiratory infection: Secondary | ICD-10-CM | POA: Diagnosis present

## 2020-01-13 DIAGNOSIS — S72001D Fracture of unspecified part of neck of right femur, subsequent encounter for closed fracture with routine healing: Secondary | ICD-10-CM | POA: Diagnosis not present

## 2020-01-13 DIAGNOSIS — Z950 Presence of cardiac pacemaker: Secondary | ICD-10-CM | POA: Diagnosis not present

## 2020-01-13 DIAGNOSIS — N401 Enlarged prostate with lower urinary tract symptoms: Secondary | ICD-10-CM | POA: Diagnosis present

## 2020-01-13 DIAGNOSIS — J441 Chronic obstructive pulmonary disease with (acute) exacerbation: Secondary | ICD-10-CM

## 2020-01-13 DIAGNOSIS — T380X5A Adverse effect of glucocorticoids and synthetic analogues, initial encounter: Secondary | ICD-10-CM | POA: Diagnosis present

## 2020-01-13 DIAGNOSIS — Z942 Lung transplant status: Secondary | ICD-10-CM | POA: Diagnosis not present

## 2020-01-13 DIAGNOSIS — J8410 Pulmonary fibrosis, unspecified: Secondary | ICD-10-CM | POA: Diagnosis not present

## 2020-01-13 DIAGNOSIS — Z7951 Long term (current) use of inhaled steroids: Secondary | ICD-10-CM | POA: Diagnosis not present

## 2020-01-13 DIAGNOSIS — D638 Anemia in other chronic diseases classified elsewhere: Secondary | ICD-10-CM | POA: Diagnosis present

## 2020-01-13 DIAGNOSIS — Z7984 Long term (current) use of oral hypoglycemic drugs: Secondary | ICD-10-CM | POA: Diagnosis not present

## 2020-01-13 DIAGNOSIS — N189 Chronic kidney disease, unspecified: Secondary | ICD-10-CM

## 2020-01-13 DIAGNOSIS — I130 Hypertensive heart and chronic kidney disease with heart failure and stage 1 through stage 4 chronic kidney disease, or unspecified chronic kidney disease: Secondary | ICD-10-CM | POA: Diagnosis present

## 2020-01-13 DIAGNOSIS — E119 Type 2 diabetes mellitus without complications: Secondary | ICD-10-CM | POA: Diagnosis not present

## 2020-01-13 DIAGNOSIS — K219 Gastro-esophageal reflux disease without esophagitis: Secondary | ICD-10-CM | POA: Diagnosis present

## 2020-01-13 DIAGNOSIS — I5032 Chronic diastolic (congestive) heart failure: Secondary | ICD-10-CM | POA: Diagnosis present

## 2020-01-13 DIAGNOSIS — E1165 Type 2 diabetes mellitus with hyperglycemia: Secondary | ICD-10-CM | POA: Diagnosis not present

## 2020-01-13 DIAGNOSIS — E1169 Type 2 diabetes mellitus with other specified complication: Secondary | ICD-10-CM | POA: Diagnosis present

## 2020-01-13 DIAGNOSIS — T86810 Lung transplant rejection: Secondary | ICD-10-CM | POA: Diagnosis present

## 2020-01-13 DIAGNOSIS — J984 Other disorders of lung: Secondary | ICD-10-CM | POA: Diagnosis not present

## 2020-01-13 DIAGNOSIS — I6523 Occlusion and stenosis of bilateral carotid arteries: Secondary | ICD-10-CM | POA: Diagnosis not present

## 2020-01-13 DIAGNOSIS — I4891 Unspecified atrial fibrillation: Secondary | ICD-10-CM | POA: Diagnosis not present

## 2020-01-13 DIAGNOSIS — R338 Other retention of urine: Secondary | ICD-10-CM | POA: Diagnosis present

## 2020-01-13 DIAGNOSIS — M25551 Pain in right hip: Secondary | ICD-10-CM | POA: Diagnosis not present

## 2020-01-13 DIAGNOSIS — J189 Pneumonia, unspecified organism: Secondary | ICD-10-CM | POA: Diagnosis present

## 2020-01-13 DIAGNOSIS — R55 Syncope and collapse: Secondary | ICD-10-CM | POA: Diagnosis not present

## 2020-01-13 DIAGNOSIS — I361 Nonrheumatic tricuspid (valve) insufficiency: Secondary | ICD-10-CM | POA: Diagnosis not present

## 2020-01-13 DIAGNOSIS — Y830 Surgical operation with transplant of whole organ as the cause of abnormal reaction of the patient, or of later complication, without mention of misadventure at the time of the procedure: Secondary | ICD-10-CM | POA: Diagnosis present

## 2020-01-13 DIAGNOSIS — E1122 Type 2 diabetes mellitus with diabetic chronic kidney disease: Secondary | ICD-10-CM | POA: Diagnosis present

## 2020-01-13 DIAGNOSIS — J90 Pleural effusion, not elsewhere classified: Secondary | ICD-10-CM | POA: Diagnosis not present

## 2020-01-13 DIAGNOSIS — N400 Enlarged prostate without lower urinary tract symptoms: Secondary | ICD-10-CM | POA: Diagnosis not present

## 2020-01-13 DIAGNOSIS — E871 Hypo-osmolality and hyponatremia: Secondary | ICD-10-CM | POA: Diagnosis present

## 2020-01-13 DIAGNOSIS — E785 Hyperlipidemia, unspecified: Secondary | ICD-10-CM | POA: Diagnosis present

## 2020-01-13 DIAGNOSIS — K5900 Constipation, unspecified: Secondary | ICD-10-CM | POA: Diagnosis present

## 2020-01-13 DIAGNOSIS — R0602 Shortness of breath: Secondary | ICD-10-CM | POA: Diagnosis not present

## 2020-01-13 DIAGNOSIS — S72001A Fracture of unspecified part of neck of right femur, initial encounter for closed fracture: Secondary | ICD-10-CM

## 2020-01-13 DIAGNOSIS — M25521 Pain in right elbow: Secondary | ICD-10-CM | POA: Diagnosis not present

## 2020-01-13 DIAGNOSIS — I272 Pulmonary hypertension, unspecified: Secondary | ICD-10-CM | POA: Diagnosis present

## 2020-01-13 DIAGNOSIS — E876 Hypokalemia: Secondary | ICD-10-CM | POA: Diagnosis present

## 2020-01-13 DIAGNOSIS — S299XXA Unspecified injury of thorax, initial encounter: Secondary | ICD-10-CM | POA: Diagnosis not present

## 2020-01-13 DIAGNOSIS — R0603 Acute respiratory distress: Secondary | ICD-10-CM

## 2020-01-13 LAB — BASIC METABOLIC PANEL
Anion gap: 13 (ref 5–15)
BUN: 45 mg/dL — ABNORMAL HIGH (ref 8–23)
CO2: 23 mmol/L (ref 22–32)
Calcium: 8.1 mg/dL — ABNORMAL LOW (ref 8.9–10.3)
Chloride: 96 mmol/L — ABNORMAL LOW (ref 98–111)
Creatinine, Ser: 2.56 mg/dL — ABNORMAL HIGH (ref 0.61–1.24)
GFR calc Af Amer: 28 mL/min — ABNORMAL LOW (ref >60)
GFR calc non Af Amer: 24 mL/min — ABNORMAL LOW (ref >60)
Glucose, Bld: 274 mg/dL — ABNORMAL HIGH (ref 70–99)
Potassium: 2.9 mmol/L — ABNORMAL LOW (ref 3.5–5.1)
Sodium: 132 mmol/L — ABNORMAL LOW (ref 135–145)

## 2020-01-13 LAB — CBC
HCT: 31.9 % — ABNORMAL LOW (ref 39.0–52.0)
Hemoglobin: 10.1 g/dL — ABNORMAL LOW (ref 13.0–17.0)
MCH: 26.2 pg (ref 26.0–34.0)
MCHC: 31.7 g/dL (ref 30.0–36.0)
MCV: 82.9 fL (ref 80.0–100.0)
Platelets: 143 10*3/uL — ABNORMAL LOW (ref 150–400)
RBC: 3.85 MIL/uL — ABNORMAL LOW (ref 4.22–5.81)
RDW: 14.5 % (ref 11.5–15.5)
WBC: 10.8 10*3/uL — ABNORMAL HIGH (ref 4.0–10.5)
nRBC: 0 % (ref 0.0–0.2)

## 2020-01-13 LAB — SARS CORONAVIRUS 2 BY RT PCR (HOSPITAL ORDER, PERFORMED IN ~~LOC~~ HOSPITAL LAB): SARS Coronavirus 2: NEGATIVE

## 2020-01-13 LAB — GLUCOSE, CAPILLARY: Glucose-Capillary: 273 mg/dL — ABNORMAL HIGH (ref 70–99)

## 2020-01-13 MED ORDER — ONDANSETRON HCL 4 MG/2ML IJ SOLN: 4.0000 mg | Freq: Four times a day (QID) | INTRAMUSCULAR | Status: DC | PRN

## 2020-01-13 MED ORDER — ACETAMINOPHEN 325 MG PO TABS
650.0000 mg | ORAL_TABLET | Freq: Four times a day (QID) | ORAL | Status: DC | PRN
  Administered 2020-01-14 (×2): 650 mg via ORAL
  Filled 2020-01-13 (×2): qty 2

## 2020-01-13 MED ORDER — SULFAMETHOXAZOLE-TRIMETHOPRIM 400-80 MG PO TABS
1.0000 | ORAL_TABLET | ORAL | Status: DC
  Administered 2020-01-15 – 2020-01-17 (×2): 1 via ORAL
  Filled 2020-01-13 (×2): qty 1

## 2020-01-13 MED ORDER — DAPAGLIFLOZIN PROPANEDIOL 10 MG PO TABS: 10.0000 mg | ORAL_TABLET | ORAL | Status: DC

## 2020-01-13 MED ORDER — ACETAMINOPHEN 650 MG RE SUPP: 650.0000 mg | Freq: Four times a day (QID) | RECTAL | Status: DC | PRN

## 2020-01-13 MED ORDER — SODIUM CHLORIDE 0.9 % IV SOLN: INTRAVENOUS | Status: DC

## 2020-01-13 MED ORDER — IPRATROPIUM-ALBUTEROL 0.5-2.5 (3) MG/3ML IN SOLN
RESPIRATORY_TRACT | Status: AC
  Administered 2020-01-14: 3 mL
  Filled 2020-01-13: qty 3

## 2020-01-13 MED ORDER — LORATADINE 10 MG PO TABS
10.0000 mg | ORAL_TABLET | Freq: Every day | ORAL | Status: DC
  Administered 2020-01-15 – 2020-01-18 (×4): 10 mg via ORAL
  Filled 2020-01-13 (×4): qty 1

## 2020-01-13 MED ORDER — PRAVASTATIN SODIUM 20 MG PO TABS
20.0000 mg | ORAL_TABLET | Freq: Every day | ORAL | Status: DC
  Administered 2020-01-14 – 2020-01-18 (×5): 20 mg via ORAL
  Filled 2020-01-13 (×5): qty 1

## 2020-01-13 MED ORDER — FUROSEMIDE 20 MG PO TABS
20.0000 mg | ORAL_TABLET | Freq: Every day | ORAL | Status: DC
  Administered 2020-01-15: 20 mg via ORAL
  Filled 2020-01-13: qty 1

## 2020-01-13 MED ORDER — SODIUM CHLORIDE 0.9 % IV BOLUS
1000.0000 mL | Freq: Once | INTRAVENOUS | Status: AC
  Administered 2020-01-13: 1000 mL via INTRAVENOUS

## 2020-01-13 MED ORDER — SODIUM CHLORIDE 3 % IN NEBU
4.0000 mL | INHALATION_SOLUTION | Freq: Two times a day (BID) | RESPIRATORY_TRACT | Status: AC
  Administered 2020-01-14 – 2020-01-16 (×3): 4 mL via RESPIRATORY_TRACT
  Filled 2020-01-13 (×6): qty 4

## 2020-01-13 MED ORDER — MORPHINE SULFATE (PF) 4 MG/ML IV SOLN: 4.0000 mg | Freq: Once | INTRAVENOUS | Status: AC

## 2020-01-13 MED ORDER — TACROLIMUS 0.5 MG PO CAPS
0.5000 mg | ORAL_CAPSULE | Freq: Every day | ORAL | Status: DC
  Filled 2020-01-13: qty 1

## 2020-01-13 MED ORDER — MYCOPHENOLATE MOFETIL 250 MG PO CAPS
1000.0000 mg | ORAL_CAPSULE | Freq: Two times a day (BID) | ORAL | Status: DC
  Administered 2020-01-14: 1000 mg via ORAL
  Filled 2020-01-13 (×2): qty 4

## 2020-01-13 MED ORDER — ADULT MULTIVITAMIN W/MINERALS CH
1.0000 | ORAL_TABLET | Freq: Every day | ORAL | Status: DC
  Administered 2020-01-16 – 2020-01-18 (×3): 1 via ORAL
  Filled 2020-01-13 (×4): qty 1

## 2020-01-13 MED ORDER — VITAMIN D 25 MCG (1000 UNIT) PO TABS
1000.0000 [IU] | ORAL_TABLET | Freq: Every day | ORAL | Status: DC
  Administered 2020-01-15 – 2020-01-18 (×4): 1000 [IU] via ORAL
  Filled 2020-01-13 (×4): qty 1

## 2020-01-13 MED ORDER — ONDANSETRON HCL 4 MG/2ML IJ SOLN
4.0000 mg | Freq: Once | INTRAMUSCULAR | Status: AC
  Administered 2020-01-13: 4 mg via INTRAVENOUS
  Filled 2020-01-13: qty 2

## 2020-01-13 MED ORDER — VALGANCICLOVIR HCL 450 MG PO TABS
450.0000 mg | ORAL_TABLET | ORAL | Status: DC
  Administered 2020-01-14 – 2020-01-16 (×2): 450 mg via ORAL
  Filled 2020-01-13 (×2): qty 1

## 2020-01-13 MED ORDER — TRAZODONE HCL 50 MG PO TABS
25.0000 mg | ORAL_TABLET | Freq: Every evening | ORAL | Status: DC | PRN
  Administered 2020-01-15 – 2020-01-17 (×3): 25 mg via ORAL
  Filled 2020-01-13 (×4): qty 1

## 2020-01-13 MED ORDER — MORPHINE SULFATE (PF) 4 MG/ML IV SOLN
4.0000 mg | Freq: Once | INTRAVENOUS | Status: AC
  Administered 2020-01-13: 4 mg via INTRAVENOUS
  Filled 2020-01-13: qty 1

## 2020-01-13 MED ORDER — METOPROLOL TARTRATE 25 MG PO TABS
25.0000 mg | ORAL_TABLET | Freq: Two times a day (BID) | ORAL | Status: DC
  Administered 2020-01-14 – 2020-01-18 (×8): 25 mg via ORAL
  Filled 2020-01-13 (×9): qty 1

## 2020-01-13 MED ORDER — PREDNISONE 10 MG PO TABS: 5.0000 mg | ORAL_TABLET | Freq: Every day | ORAL | Status: DC

## 2020-01-13 MED ORDER — MELATONIN 5 MG PO TABS
5.0000 mg | ORAL_TABLET | Freq: Every evening | ORAL | Status: DC | PRN
  Filled 2020-01-13: qty 1

## 2020-01-13 MED ORDER — PANTOPRAZOLE SODIUM 40 MG PO TBEC
40.0000 mg | DELAYED_RELEASE_TABLET | Freq: Two times a day (BID) | ORAL | Status: DC
  Administered 2020-01-14 – 2020-01-18 (×8): 40 mg via ORAL
  Filled 2020-01-13 (×8): qty 1

## 2020-01-13 MED ORDER — MOMETASONE FURO-FORMOTEROL FUM 200-5 MCG/ACT IN AERO
2.0000 | INHALATION_SPRAY | Freq: Two times a day (BID) | RESPIRATORY_TRACT | Status: DC
  Administered 2020-01-14 – 2020-01-18 (×9): 2 via RESPIRATORY_TRACT
  Filled 2020-01-13: qty 8.8

## 2020-01-13 MED ORDER — METOPROLOL SUCCINATE ER 50 MG PO TB24: 50.0000 mg | ORAL_TABLET | ORAL | Status: DC

## 2020-01-13 MED ORDER — MORPHINE SULFATE (PF) 4 MG/ML IV SOLN
INTRAVENOUS | Status: AC
  Administered 2020-01-13: 4 mg via INTRAVENOUS
  Filled 2020-01-13: qty 1

## 2020-01-13 MED ORDER — ONDANSETRON HCL 4 MG PO TABS: 4.0000 mg | ORAL_TABLET | Freq: Four times a day (QID) | ORAL | Status: DC | PRN

## 2020-01-13 MED ORDER — TAMSULOSIN HCL 0.4 MG PO CAPS
0.4000 mg | ORAL_CAPSULE | Freq: Every day | ORAL | Status: DC
  Administered 2020-01-15 – 2020-01-18 (×4): 0.4 mg via ORAL
  Filled 2020-01-13 (×4): qty 1

## 2020-01-13 NOTE — ED Notes (Signed)
Pt transported to xray 

## 2020-01-13 NOTE — ED Notes (Signed)
cbg [id number]

## 2020-01-13 NOTE — ED Triage Notes (Signed)
Pt arrived via EMS from where pt had a syncopal episode this evening. Denied hitting head but fell onto the right elbow and right hip. No shortening or rotation noted but unable to bare weight on right leg. Pt is a lung transplant recipient in 2016, normally does not require O2 but today pt is 88% RA. Skin tear to the right elbow/forearm.

## 2020-01-13 NOTE — ED Provider Notes (Signed)
[facility] Emergency Department Provider Note   ____________________________________________   I have reviewed the triage vital signs and the nursing notes.   HISTORY  Chief Complaint Right hip  History limited by: Not Limited   HPI Caleb [name] is a 74 y.o. male who presents to the emergency department today with primary concern for right hip pain. The patient states that he had a syncopal episode today. Denies any associated chest pain or palpitations. He fell hard onto his right side. Has had severe pain in the right hip since then. He also suffered a skin tear to his right elbow. 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 He says that he has been having trouble breathing for a long time and it is unclear if it has gotten any worse recently.   Records reviewed. Per medical record review patient has a history of DM, lung transplant.   Past Medical History:  Diagnosis Date  . BPH (benign prostatic hyperplasia)   . Chest pain, unspecified   . Diabetes mellitus without complication ([place])   . Dyspnea   . Hyperlipidemia   . Hypertension     Patient Active Problem List   Diagnosis Date Noted  . Tachycardia 07/18/2019  . Uncontrolled type 2 diabetes mellitus with hyperglycemia ([place]) 01/01/2018  . 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 Hyperlipidemia   . Hypertension   . BPH (benign prostatic hyperplasia)   . Diabetes mellitus without complication ([place])   . HYPERLIPIDEMIA-MIXED 12/31/2008  . Essential hypertension 12/31/2008  . DYSPNEA 12/31/2008  . CHEST PAIN-UNSPECIFIED 12/31/2008    Past Surgical History:  Procedure Laterality Date  . LUNG TRANSPLANT      Prior to Admission medications   Medication Sig Start Date End Date Taking? Authorizing Provider  acetaminophen (TYLENOL) 325 MG tablet Take 650 mg by mouth every 6 (six) hours as needed.     [provider]  albuterol (PROVENTIL) (2.5 MG/3ML) 0.083% nebulizer solution Inhale into the lungs. 06/06/18   [provider]  albuterol (VENTOLIN HFA) 108 (90 Base) MCG/ACT inhaler Inhale into the lungs. 04/11/18 04/11/19  [provider]  azithromycin (ZITHROMAX) 250 MG tablet Take by mouth daily.    [provider]  budesonide-formoterol (SYMBICORT) 160-4.5 MCG/ACT inhaler Inhale into the lungs. 06/05/18 06/05/19  [provider]  Cholecalciferol (VITAMIN D3) 1000 units CAPS Take by mouth.    [provider]  dapagliflozin propanediol (FARXIGA) 10 MG TABS tablet Take 1 tablet by mouth 1 day or 1 dose. 08/20/19 08/19/20  [provider]  fexofenadine (ALLEGRA) 180 MG tablet Take 180 mg by mouth daily.    [provider]  furosemide (LASIX) 20 MG tablet Take 20 mg by mouth. One tablet by mouth three times a week Sunday , Tuesday , friday    [provider]  glipiZIDE (GLUCOTROL) 5 MG tablet TK 1 T PO BID B MEALS 06/05/17   [provider]  glucose blood (ONETOUCH VERIO) test strip TEST BLOOD SUGARS TWICE DAILY. DX E11.65. 11/27/19   Kendell Bane, NP  Immune Globulin 10% (GAMUNEX-C) 10 GM/100ML SOLN Inject into the vein. Inject 40 g into the vein monthly    [provider]  Isavuconazonium Sulfate (CRESEMBA) 186 MG CAPS Take by mouth.    [provider]  Lancets 28G MISC Use as instructed QID 08/05/14   [provider]  lovastatin (MEVACOR) 20 MG tablet  Take 20 mg by mouth daily.    [provider]  Melatonin (MELATONIN MAXIMUM STRENGTH) 5 MG TABS Take by mouth. 03/28/18   [provider]  metoprolol succinate (TOPROL-XL) 50 MG 24 hr tablet Take 1 tablet by mouth 1 day or 1 dose. 09/17/19   [provider]  metoprolol tartrate (LOPRESSOR) 25 MG tablet Take 1/2 to 1 tablet po BID 08/14/19   Ronnell Freshwater, NP  Multiple Vitamin (MULTI-VITAMINS) TABS Take by mouth.     [provider]  mycophenolate (CELLCEPT) 250 MG capsule Take by mouth 2 (two) times daily. Take four capsules by mouth every twelve hours    [provider]  pantoprazole (PROTONIX) 40 MG tablet Take 40 mg by mouth 2 (two) times daily.     [provider]  predniSONE (DELTASONE) 5 MG tablet TK 1 T PO  QD 07/01/17   [provider]  sodium chloride HYPERTONIC 3 % nebulizer solution Inhale 53mls via nebulizer twice daily, following albuterol neb. 06/07/18   [provider]  sulfamethoxazole-trimethoprim (BACTRIM) 400-80 MG tablet Take 1 tablet by mouth 3 (three) times a week. 08/19/19 08/18/20  [provider]  tacrolimus (PROGRAF) 0.5 MG capsule 02/01/18: Take in combination with 1 mg tablets for a total of 1 mg by mouth in the morning, and 0.5 mg in the evening. 02/01/18   [provider]  tacrolimus (PROGRAF) 1 MG capsule 02/01/18: Take in combination with 0.5 mg tablets for a total of 1 mg by mouth in the morning, and 0.5 mg in the evening. 02/01/18   [provider]  tamsulosin (FLOMAX) 0.4 MG CAPS Take 0.4 mg by mouth daily after breakfast. 1 on saturdays    [provider]  valGANciclovir (VALCYTE) 450 MG tablet Take 450 mg by mouth every other day.    [provider]    Allergies Nsaids  Family History  Problem Relation Age of Onset  . Coronary artery disease Other   . Diabetes Other     Social History Social History   Tobacco Use  . Smoking status: Former Smoker    Types: Cigarettes    Quit date: 05/16/1979    Years since quitting: 40.6  . Smokeless tobacco: Never Used  . Tobacco comment: Quit 1980  Vaping Use  . Vaping Use: Never used  Substance Use Topics  . Alcohol use: Not Currently  . Drug use: No    Review of Systems Constitutional: No fever/chills Eyes: No visual changes. ENT: No sore throat. Cardiovascular: Denies chest pain. Respiratory: Denies shortness of  breath. Gastrointestinal: No abdominal pain.  No nausea, no vomiting.  No diarrhea.   Genitourinary: Negative for dysuria. Musculoskeletal: Positive for right hip pain. Skin: Negative for rash. Neurological: Negative for headaches, focal weakness or numbness.  ____________________________________________   PHYSICAL EXAM:  VITAL SIGNS: ED Triage Vitals [01/13/20 1915]  Enc Vitals Group     BP 125/66     Pulse Rate 94     Resp (!) 22     Temp 99.2 F (37.3 C)     Temp Source Oral     SpO2 (!) 88 %   Constitutional: Alert and oriented.  Eyes: Conjunctivae are normal.  ENT      Head: Normocephalic and atraumatic.      Nose: No congestion/rhinnorhea.      Mouth/Throat: Mucous membranes are moist.      Neck: No stridor. Hematological/Lymphatic/Immunilogical: No cervical lymphadenopathy. Cardiovascular: Normal rate, regular rhythm.  No  murmurs, rubs, or gallops.  Respiratory: Slightly increased work of breathing.  Gastrointestinal: Soft and non tender. No rebound. No guarding.  Genitourinary: Deferred Musculoskeletal: Right hip tender to palpation and manipulation. DP 2+ Neurologic:  Normal speech and language. No gross focal neurologic deficits are appreciated.  Skin:  Positive for skin tear to right elbow. Psychiatric: Mood and affect are normal. Speech and behavior are normal. Patient exhibits appropriate insight and judgment.  ____________________________________________    LABS (pertinent positives/negatives)  COVID negative CBC wbc 10.8, hgb 10.1, plt 143 BMP na 132, k 2.9, glu 274, cr 2.56  ____________________________________________   EKG  I, Nance Pear, attending physician, personally viewed and interpreted this EKG  EKG Time: 1926 Rate: 90 Rhythm: sinus rhythm Axis: right axis deviation Intervals: qtc 517 QRS: RBBB, LPFB ST changes: no st elevation Impression: abnormal ekg ____________________________________________    RADIOLOGY  CXR No  acute airspace disease  Right hip Fracture of right femural neck  Right elbow No acute abnormality  ____________________________________________   PROCEDURES  Procedures  ____________________________________________   INITIAL IMPRESSION / ASSESSMENT AND PLAN / ED COURSE  Pertinent labs & imaging results that were available during my care of the patient were reviewed by me and considered in my medical decision making (see chart for details).   Patient presented to the emergency department with primary complaint of right hip pain after syncopal episode.  On exam patient does have pain and tenderness to the right hip.  Dorsalis pedis pulse intact.  The patient did have x-ray performed which shows a right hip fracture.  Discussed with Dr. Rudene Christians.  Patient was also found to be slightly hypoxic.  Patient has history of significant lung disease and lung transplant. Will plan on admission to the hospitalist service.   ____________________________________________   FINAL CLINICAL IMPRESSION(S) / ED DIAGNOSES  Final diagnoses:  Syncope, unspecified syncope type  Closed fracture of right hip, initial encounter [facility])     Note: This dictation was prepared with Dragon dictation. Any transcriptional errors that result from this process are unintentional     Nance Pear, MD 01/13/20 2257

## 2020-01-14 ENCOUNTER — Inpatient Hospital Stay: Payer: Medicare Other

## 2020-01-14 ENCOUNTER — Other Ambulatory Visit: Payer: Self-pay

## 2020-01-14 ENCOUNTER — Encounter: Payer: Self-pay | Admitting: Anesthesiology

## 2020-01-14 ENCOUNTER — Inpatient Hospital Stay (HOSPITAL_COMMUNITY)
Admit: 2020-01-14 | Discharge: 2020-01-14 | Disposition: A | Discharge status: Home / Self Care | Payer: Medicare Other | Attending: Family Medicine | Admitting: Family Medicine

## 2020-01-14 DIAGNOSIS — Z942 Lung transplant status: Secondary | ICD-10-CM | POA: Diagnosis not present

## 2020-01-14 DIAGNOSIS — N179 Acute kidney failure, unspecified: Secondary | ICD-10-CM | POA: Diagnosis not present

## 2020-01-14 DIAGNOSIS — E1165 Type 2 diabetes mellitus with hyperglycemia: Secondary | ICD-10-CM

## 2020-01-14 DIAGNOSIS — E1100 Type 2 diabetes mellitus with hyperosmolarity without nonketotic hyperglycemic-hyperosmolar coma (NKHHC): Secondary | ICD-10-CM

## 2020-01-14 DIAGNOSIS — J9601 Acute respiratory failure with hypoxia: Secondary | ICD-10-CM | POA: Diagnosis not present

## 2020-01-14 DIAGNOSIS — I361 Nonrheumatic tricuspid (valve) insufficiency: Secondary | ICD-10-CM | POA: Diagnosis not present

## 2020-01-14 LAB — GLUCOSE, CAPILLARY
Glucose-Capillary: 260 mg/dL — ABNORMAL HIGH (ref 70–99)
Glucose-Capillary: 245 mg/dL — ABNORMAL HIGH (ref 70–99)

## 2020-01-14 LAB — BLOOD GAS, ARTERIAL
Acid-base deficit: 2.7 mmol/L — ABNORMAL HIGH (ref 0.0–2.0)
Bicarbonate: 22.6 mmol/L (ref 20.0–28.0)
Delivery systems: POSITIVE
Expiratory PAP: 6
FIO2: 0.4
Inspiratory PAP: 12
Mechanical Rate: 8
O2 Saturation: 93.8 %
Patient temperature: 37
RATE: 8 resp/min
pCO2 arterial: 40 mmHg (ref 32.0–48.0)
pH, Arterial: 7.36 (ref 7.350–7.450)
pO2, Arterial: 73 mmHg — ABNORMAL LOW (ref 83.0–108.0)

## 2020-01-14 LAB — COMPREHENSIVE METABOLIC PANEL
ALT: 17 U/L (ref 0–44)
AST: 26 U/L (ref 15–41)
Albumin: 3.3 g/dL — ABNORMAL LOW (ref 3.5–5.0)
Alkaline Phosphatase: 58 U/L (ref 38–126)
Anion gap: 12 (ref 5–15)
BUN: 50 mg/dL — ABNORMAL HIGH (ref 8–23)
CO2: 23 mmol/L (ref 22–32)
Calcium: 8.1 mg/dL — ABNORMAL LOW (ref 8.9–10.3)
Chloride: 97 mmol/L — ABNORMAL LOW (ref 98–111)
Creatinine, Ser: 3.01 mg/dL — ABNORMAL HIGH (ref 0.61–1.24)
GFR calc Af Amer: 23 mL/min — ABNORMAL LOW (ref >60)
GFR calc non Af Amer: 20 mL/min — ABNORMAL LOW (ref >60)
Glucose, Bld: 261 mg/dL — ABNORMAL HIGH (ref 70–99)
Potassium: 4.3 mmol/L (ref 3.5–5.1)
Sodium: 132 mmol/L — ABNORMAL LOW (ref 135–145)
Total Bilirubin: 1.2 mg/dL (ref 0.3–1.2)
Total Protein: 6.2 g/dL — ABNORMAL LOW (ref 6.5–8.1)

## 2020-01-14 LAB — BASIC METABOLIC PANEL
Anion gap: 10 (ref 5–15)
BUN: 44 mg/dL — ABNORMAL HIGH (ref 8–23)
CO2: 27 mmol/L (ref 22–32)
Calcium: 8.2 mg/dL — ABNORMAL LOW (ref 8.9–10.3)
Chloride: 97 mmol/L — ABNORMAL LOW (ref 98–111)
Creatinine, Ser: 2.53 mg/dL — ABNORMAL HIGH (ref 0.61–1.24)
GFR calc Af Amer: 28 mL/min — ABNORMAL LOW (ref >60)
GFR calc non Af Amer: 25 mL/min — ABNORMAL LOW (ref >60)
Glucose, Bld: 164 mg/dL — ABNORMAL HIGH (ref 70–99)
Potassium: 3.6 mmol/L (ref 3.5–5.1)
Sodium: 134 mmol/L — ABNORMAL LOW (ref 135–145)

## 2020-01-14 LAB — HEMOGLOBIN A1C
Hgb A1c MFr Bld: 10.3 % — ABNORMAL HIGH (ref 4.8–5.6)
Mean Plasma Glucose: 248.91 mg/dL

## 2020-01-14 LAB — CBC WITH DIFFERENTIAL/PLATELET
Abs Immature Granulocytes: 0.1 10*3/uL — ABNORMAL HIGH (ref 0.00–0.07)
Basophils Absolute: 0.1 10*3/uL (ref 0.0–0.1)
Basophils Relative: 0 %
Eosinophils Absolute: 0.1 10*3/uL (ref 0.0–0.5)
Eosinophils Relative: 1 %
HCT: 33.6 % — ABNORMAL LOW (ref 39.0–52.0)
Hemoglobin: 11.1 g/dL — ABNORMAL LOW (ref 13.0–17.0)
Immature Granulocytes: 1 %
Lymphocytes Relative: 2 %
Lymphs Abs: 0.3 10*3/uL — ABNORMAL LOW (ref 0.7–4.0)
MCH: 26.7 pg (ref 26.0–34.0)
MCHC: 33 g/dL (ref 30.0–36.0)
MCV: 81 fL (ref 80.0–100.0)
Monocytes Absolute: 0.6 10*3/uL (ref 0.1–1.0)
Monocytes Relative: 4 %
Neutro Abs: 12.3 10*3/uL — ABNORMAL HIGH (ref 1.7–7.7)
Neutrophils Relative %: 92 %
Platelets: 139 10*3/uL — ABNORMAL LOW (ref 150–400)
RBC: 4.15 MIL/uL — ABNORMAL LOW (ref 4.22–5.81)
RDW: 15.3 % (ref 11.5–15.5)
WBC: 13.5 10*3/uL — ABNORMAL HIGH (ref 4.0–10.5)
nRBC: 0 % (ref 0.0–0.2)

## 2020-01-14 LAB — BRAIN NATRIURETIC PEPTIDE: B Natriuretic Peptide: 990.4 pg/mL — ABNORMAL HIGH (ref 0.0–100.0)

## 2020-01-14 LAB — ECHOCARDIOGRAM COMPLETE
Height: 69 in
S' Lateral: 2.7 cm
Weight: 3648 [oz_av]

## 2020-01-14 LAB — LACTIC ACID, PLASMA: Lactic Acid, Venous: 1.8 mmol/L (ref 0.5–1.9)

## 2020-01-14 LAB — PROCALCITONIN: Procalcitonin: 0.63 ng/mL

## 2020-01-14 LAB — MAGNESIUM: Magnesium: 2.1 mg/dL (ref 1.7–2.4)

## 2020-01-14 MED ORDER — TECHNETIUM TO 99M ALBUMIN AGGREGATED
4.5600 | Freq: Once | INTRAVENOUS | Status: AC | PRN
  Administered 2020-01-14: 4.56 via INTRAVENOUS

## 2020-01-14 MED ORDER — SODIUM CHLORIDE 0.9 % IV SOLN
2.0000 g | INTRAVENOUS | Status: DC
  Administered 2020-01-14 – 2020-01-18 (×5): 2 g via INTRAVENOUS
  Filled 2020-01-14 (×5): qty 2

## 2020-01-14 MED ORDER — FUROSEMIDE 10 MG/ML IJ SOLN
INTRAMUSCULAR | Status: AC
  Administered 2020-01-14: 40 mg via INTRAVENOUS
  Filled 2020-01-14: qty 4

## 2020-01-14 MED ORDER — TACROLIMUS 1 MG PO CAPS
1.0000 mg | ORAL_CAPSULE | Freq: Two times a day (BID) | ORAL | Status: DC
  Administered 2020-01-14 – 2020-01-18 (×9): 1 mg via ORAL
  Filled 2020-01-14 (×11): qty 1

## 2020-01-14 MED ORDER — SIROLIMUS 1 MG PO TABS
1.0000 mg | ORAL_TABLET | Freq: Every day | ORAL | Status: DC
  Administered 2020-01-14 – 2020-01-18 (×5): 1 mg via ORAL
  Filled 2020-01-14 (×8): qty 1

## 2020-01-14 MED ORDER — METHYLPREDNISOLONE SODIUM SUCC 40 MG IJ SOLR
40.0000 mg | Freq: Two times a day (BID) | INTRAMUSCULAR | Status: DC
  Administered 2020-01-14 – 2020-01-18 (×8): 40 mg via INTRAVENOUS
  Filled 2020-01-14 (×8): qty 1

## 2020-01-14 MED ORDER — INSULIN DETEMIR 100 UNIT/ML ~~LOC~~ SOLN
3.0000 [IU] | Freq: Two times a day (BID) | SUBCUTANEOUS | Status: DC
  Administered 2020-01-14: 3 [IU] via SUBCUTANEOUS
  Filled 2020-01-14 (×4): qty 0.03

## 2020-01-14 MED ORDER — TACROLIMUS 1 MG PO CAPS
1.0000 mg | ORAL_CAPSULE | Freq: Every day | ORAL | Status: DC
  Administered 2020-01-14: 1 mg via ORAL
  Filled 2020-01-14: qty 1

## 2020-01-14 MED ORDER — FUROSEMIDE 10 MG/ML IJ SOLN
20.0000 mg | Freq: Once | INTRAMUSCULAR | Status: AC
  Administered 2020-01-14: 20 mg via INTRAVENOUS
  Filled 2020-01-14: qty 4

## 2020-01-14 MED ORDER — INSULIN NPH (HUMAN) (ISOPHANE) 100 UNIT/ML ~~LOC~~ SUSP: 3.0000 [IU] | Freq: Two times a day (BID) | SUBCUTANEOUS | Status: DC

## 2020-01-14 MED ORDER — IPRATROPIUM-ALBUTEROL 0.5-2.5 (3) MG/3ML IN SOLN
3.0000 mL | Freq: Four times a day (QID) | RESPIRATORY_TRACT | Status: DC
  Administered 2020-01-14 – 2020-01-18 (×15): 3 mL via RESPIRATORY_TRACT
  Filled 2020-01-14 (×14): qty 3

## 2020-01-14 MED ORDER — CHLORHEXIDINE GLUCONATE CLOTH 2 % EX PADS
6.0000 | MEDICATED_PAD | Freq: Every day | CUTANEOUS | Status: DC
  Administered 2020-01-14 – 2020-01-18 (×5): 6 via TOPICAL

## 2020-01-14 MED ORDER — MORPHINE SULFATE (PF) 2 MG/ML IV SOLN
2.0000 mg | INTRAVENOUS | Status: DC | PRN
  Administered 2020-01-14 – 2020-01-17 (×5): 2 mg via INTRAVENOUS
  Filled 2020-01-14 (×5): qty 1

## 2020-01-14 MED ORDER — FUROSEMIDE 10 MG/ML IJ SOLN: 40.0000 mg | Freq: Once | INTRAMUSCULAR | Status: AC

## 2020-01-14 MED ORDER — INSULIN ASPART 100 UNIT/ML ~~LOC~~ SOLN
0.0000 [IU] | Freq: Three times a day (TID) | SUBCUTANEOUS | Status: DC
  Administered 2020-01-14: 5 [IU] via SUBCUTANEOUS
  Administered 2020-01-15 (×3): 7 [IU] via SUBCUTANEOUS
  Administered 2020-01-15: 5 [IU] via SUBCUTANEOUS
  Administered 2020-01-16 (×4): 9 [IU] via SUBCUTANEOUS
  Administered 2020-01-17 (×2): 3 [IU] via SUBCUTANEOUS
  Administered 2020-01-17 (×2): 9 [IU] via SUBCUTANEOUS
  Administered 2020-01-18: 5 [IU] via SUBCUTANEOUS
  Administered 2020-01-18: 3 [IU] via SUBCUTANEOUS
  Administered 2020-01-18: 7 [IU] via SUBCUTANEOUS
  Filled 2020-01-14 (×15): qty 1

## 2020-01-14 MED ORDER — VANCOMYCIN HCL 2000 MG/400ML IV SOLN
2000.0000 mg | Freq: Once | INTRAVENOUS | Status: AC
  Administered 2020-01-14: 2000 mg via INTRAVENOUS
  Filled 2020-01-14: qty 400

## 2020-01-14 MED ORDER — CEFAZOLIN SODIUM-DEXTROSE 2-4 GM/100ML-% IV SOLN
2.0000 g | Freq: Once | INTRAVENOUS | Status: DC
  Filled 2020-01-14: qty 100

## 2020-01-14 MED ORDER — PREDNISONE 20 MG PO TABS: 20.0000 mg | ORAL_TABLET | Freq: Every day | ORAL | Status: DC

## 2020-01-14 MED ORDER — SODIUM CHLORIDE 0.9 % IV SOLN
1.0000 g | Freq: Three times a day (TID) | INTRAVENOUS | Status: DC
  Filled 2020-01-14 (×3): qty 1

## 2020-01-14 MED ORDER — CEFAZOLIN SODIUM-DEXTROSE 2-4 GM/100ML-% IV SOLN: 2.0000 g | Freq: Once | INTRAVENOUS | Status: DC

## 2020-01-14 NOTE — Progress Notes (Signed)
Inpatient Diabetes Program Recommendations  AACE/ADA: New Consensus Statement on Inpatient Glycemic Control (2015)  Target Ranges:  Prepandial:   less than 140 mg/dL      Peak postprandial:   less than 180 mg/dL (1-2 hours)      Critically ill patients:  140 - 180 mg/dL   Lab Results  Component Value Date   GLUCAP 273 (H) 01/13/2020   HGBA1C 7.9 (A) 11/27/2019    Review of Glycemic Control Results for Caleb [name], Caleb [name] (MRN [id number]) as of 01/14/2020 10:00  Ref. Range 01/13/2020 20:24  Glucose-Capillary Latest Ref Range: 70 - 99 mg/dL 273 (H)   Diabetes history: DM 2 Outpatient Diabetes medications:  Farxiga 10 mg daily Current orders for Inpatient glycemic control:  Farxiga 10 mg daily Inpatient Diabetes Program Recommendations:   Please hold Farxiga while in the hospital.  Also please add Novolog sensitive correction q 4 hours.   Thanks  Adah Perl, RN, BC-ADM Inpatient Diabetes Coordinator Pager [phone] (8a-5p)

## 2020-01-14 NOTE — Consult Note (Signed)
Reason for Consult: Displaced right femoral neck fracture Referring Physician: [name] is an 74 y.o. male.  HPI: Patient is a 74 year old who had a syncopal episode with no recollection of his fall.  He has a history that is remarkable for a prior lung transplant and he is has significant problems with shortness of breath.  Also diabetes and vascular disease.  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  He is able to drive but does not think he could walk through [place].  Past Medical History:  Diagnosis Date  . BPH (benign prostatic hyperplasia)   . Chest pain, unspecified   . Diabetes mellitus without complication ([place])   . Dyspnea   . Hyperlipidemia   . Hypertension     Past Surgical History:  Procedure Laterality Date  . LUNG TRANSPLANT      Family History  Problem Relation Age of Onset  . Coronary artery disease Other   . Diabetes Other     Social History:  reports that he quit smoking about 40 years ago. His smoking use included cigarettes. He has never used smokeless tobacco. He reports previous alcohol use. He reports that he does not use drugs.  Allergies:  Allergies  Allergen Reactions  . 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    Medications: I have reviewed the patient's current medications.  Results for orders placed or performed during the hospital encounter of 01/13/20 (from the past 48 hour(s))  Basic metabolic panel     Status: Abnormal   Collection Time: 01/13/20  8:12 PM  Result Value Ref Range   Sodium 132 (L) 135 - 145 mmol/L   Potassium 2.9 (L) 3.5 - 5.1 mmol/L   Chloride 96 (L) 98 - 111 mmol/L   CO2 23 22 - 32 mmol/L   Glucose, Bld 274 (H) 70 - 99 mg/dL    Comment: Glucose reference range applies only to samples taken after fasting for at least 8 hours.   BUN 45 (H) 8  - 23 mg/dL   Creatinine, Ser 2.56 (H) 0.61 - 1.24 mg/dL   Calcium 8.1 (L) 8.9 - 10.3 mg/dL   GFR calc non Af Amer 24 (L) >60 mL/min   GFR calc Af Amer 28 (L) >60 mL/min   Anion gap 13 5 - 15    Comment: Performed at [facility], [address] [place], [place] [address]  CBC     Status: Abnormal   Collection Time: 01/13/20  8:12 PM  Result Value Ref Range   WBC 10.8 (H) 4.0 - 10.5 K/uL   RBC 3.85 (L) 4.22 - 5.81 MIL/uL   Hemoglobin 10.1 (L) 13.0 - 17.0 g/dL   HCT 31.9 (L) 39 - 52 %   MCV 82.9 80.0 - 100.0 fL   MCH 26.2 26.0 - 34.0 pg   MCHC 31.7 30.0 - 36.0 g/dL   RDW 14.5 11.5 - 15.5 %   Platelets 143 (L) 150 - 400 K/uL   nRBC 0.0 0.0 - 0.2 %    Comment: Performed at [facility], [address] [place], [place] [address]  SARS Coronavirus 2 by RT PCR (hospital order, performed in [facility] hospital lab) Nasopharyngeal Nasopharyngeal Swab     Status: None   Collection Time: 01/13/20  8:12 PM   Specimen: Nasopharyngeal Swab  Result Value Ref Range   SARS Coronavirus 2 NEGATIVE NEGATIVE    Comment: (NOTE) SARS-CoV-2 target nucleic acids are NOT DETECTED.  The SARS-CoV-2 RNA is generally detectable in upper and lower respiratory specimens during the acute phase of infection. The lowest concentration of SARS-CoV-2 viral copies this assay can detect is 250 copies / mL. A negative result does not preclude SARS-CoV-2 infection and should not be used as the sole basis for treatment or other patient management decisions.  A negative result may occur with improper specimen collection / handling, submission of specimen other than nasopharyngeal swab, presence of viral mutation(s) within the areas targeted by this assay, and inadequate number of viral copies (<250 copies / mL). A negative result must be combined with clinical observations, patient history, and epidemiological information.  Fact Sheet for Patients:    StrictlyIdeas.no  Fact Sheet for Healthcare Providers: BankingDealers.co.za  This test is not yet approved or  cleared by the [place] FDA and has been authorized for detection and/or diagnosis of SARS-CoV-2 by FDA under an Emergency Use Authorization (EUA).  This EUA will remain in effect (meaning this test can be used) for the duration of the COVID-19 declaration under Section 564(b)(1) of the Act, 21 U.S.C. section 360bbb-3(b)(1), unless the authorization is terminated or revoked sooner.  Performed at [facility], [address] [place], [place] [address]   Glucose, capillary     Status: Abnormal   Collection Time: 01/13/20  8:24 PM  Result Value Ref Range   Glucose-Capillary 273 (H) 70 - 99 mg/dL    Comment: Glucose reference range applies only to samples taken after fasting for at least 8 hours.  Basic metabolic panel     Status: Abnormal   Collection Time: 01/14/20  5:09 AM  Result Value Ref Range   Sodium 134 (L) 135 - 145 mmol/L   Potassium 3.6 3.5 - 5.1 mmol/L   Chloride 97 (L) 98 - 111 mmol/L   CO2 27 22 - 32 mmol/L   Glucose, Bld 164 (H) 70 - 99 mg/dL    Comment: Glucose reference range applies only to samples taken after fasting for at least 8 hours.   BUN 44 (H) 8 - 23 mg/dL   Creatinine, Ser 2.53 (H) 0.61 - 1.24 mg/dL   Calcium 8.2 (L) 8.9 - 10.3 mg/dL   GFR calc non Af Amer 25 (L) >60 mL/min   GFR calc Af Amer 28 (L) >60 mL/min   Anion gap 10 5 - 15    Comment: Performed at [facility], [address] [place], [place] [address]    DG Chest 2 View  Result Date: 01/13/2020 CLINICAL DATA:  Syncopal event with fall, former smoker, history of lung transplant EXAM: CHEST - 2 VIEW COMPARISON:  06/25/2013 chest radiograph. FINDINGS: Right internal jugular Port-A-Cath terminates over the cavoatrial junction. Three lead left subclavian ICD with lead tips overlying the right atrium, right  ventricle and coronary sinus. Stable cardiomediastinal silhouette with mild cardiomegaly. No pneumothorax. No pleural effusion. Asymmetric volume loss in the left hemithorax. Patchy and reticular opacities throughout the left lung, worsened from prior. Minimal scarring at the right lung base. Otherwise clear right lung. No displaced fractures in the visualized chest. IMPRESSION: 1. Minimal scarring at the right lung base. Otherwise clear right lung. 2. 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 3. Mild cardiomegaly. Electronically Signed   By: Ilona Sorrel M.D.   On: 01/13/2020 20:18   DG  Elbow Complete Right  Result Date: 01/13/2020 CLINICAL DATA:  Syncopal episode with right elbow pain EXAM: RIGHT ELBOW - COMPLETE 3+ VIEW COMPARISON:  None. FINDINGS: No fracture, joint effusion or dislocation. No suspicious focal osseous lesions. No significant degenerative arthropathy. No radiopaque foreign body. IMPRESSION: No right elbow fracture, joint effusion or malalignment. Electronically Signed   By: Ilona Sorrel M.D.   On: 01/13/2020 20:13   DG Hip Unilat W or Wo Pelvis 2-3 Views Right  Result Date: 01/13/2020 CLINICAL DATA:  74 year old male with fall and right hip pain. EXAM: DG HIP (WITH OR WITHOUT PELVIS) 2-3V RIGHT COMPARISON:  CT of the abdomen pelvis dated 06/17/2013. FINDINGS: Minimally displaced fracture of the right femoral neck. There is no dislocation. The bones are osteopenic. The soft tissues are unremarkable. IMPRESSION: Minimally displaced fracture of the right femoral neck. Electronically Signed   By: Anner Crete M.D.   On: 01/13/2020 20:14    Review of Systems Blood pressure 127/76, pulse (!) 102, temperature 98.4 F (36.9 C), resp. rate 16, SpO2 96 %. Physical Exam Right leg is shortened and externally rotated with intact dorsalis pedis pulse and able to flex down the toes with sensation intact.  Skin and around the hip is  intact. Assessment/Plan: Displaced right femoral neck fracture without significant osteoarthritis Recommendation is for hemiarthroplasty since he is not that active with his lung disease.  Discussed risk of surgery with the patient.  Caleb [name] 01/14/2020, 6:44 AM

## 2020-01-14 NOTE — Progress Notes (Signed)
Patients o2 sats 77% at 6lpm. Repositioned patient and o2 came back up to 94-95% with 6lpm via N,C.

## 2020-01-14 NOTE — Consult Note (Signed)
Reason for Consult: Acute respiratory failure with hypoxia in a patient status post right lung transplant 2015. Referring Physician: Karie Kirks, DO  Caleb [name] is an 74 y.o. male.  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  He fell on his right side and developed right hip pain immediately.  He was unable to ambulate.  He presented to the emergency room where he was noted to have a temperature of 99.3 and was noted to have pulse oximetry 88% and was placed on 2 L of nasal cannula O2.  Subsequently his oxygen needs increased to 6 L via nasal cannula.  The patient was admitted after a right hip fracture was noted.  He was planned to have ORIF today however his persistent hypoxia precluded the procedure.  The patient was seen in his room, he is supine laying in bed looks uncomfortable but is not tachypneic.  Wife is present in the room.  He states that he has been "wheezy" over the last few days.  He has had issues with syncope previously that has had negative work-up so far.  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  He has not had to have oxygen at home.  He does not describe any fevers, chills or sweats.  He does note some abdominal distention that he says is "fluid".  He cites omission of his Lasix dose this morning as the culprit.  He also does note lower extremity edema.  He has had inability to void because of being bedridden due to his hip fracture.  He is followed by the [facility] transplant service and was last seen there in the latter part of July.  He does not endorse any other symptomatology.  He does have pain on his right hip and as noted does look uncomfortable.  Past Medical  History:  Diagnosis Date   BPH (benign prostatic hyperplasia)    Chest pain, unspecified    Diabetes mellitus without complication ([place])    Dyspnea    Hyperlipidemia    Hypertension     Past Surgical History:  Procedure Laterality Date   LUNG TRANSPLANT      Family History  Problem Relation Age of Onset   Coronary artery disease Other    Diabetes Other     Social History:  reports that he quit smoking about 40 years ago. His smoking use included cigarettes. He has never used smokeless tobacco. He reports previous alcohol use. He reports that he does not use drugs.  Allergies:  Allergies  Allergen Reactions   Nsaids Other (See Comments)    S/p lung transplant on prograf which in combination with NSAIDs can cause renal failure.    Medications:  Scheduled:  Chlorhexidine Gluconate Cloth  6 each Topical Daily   cholecalciferol  1,000 Units Oral Daily   furosemide       furosemide  40 mg Intravenous Once   furosemide  20 mg Oral Daily   insulin aspart  0-9 Units Subcutaneous TID WC   insulin detemir  3 Units Subcutaneous BID AC & HS   ipratropium-albuterol  3 mL Nebulization QID   loratadine  10 mg Oral Daily  metoprolol tartrate  25 mg Oral BID   mometasone-formoterol  2 puff Inhalation BID   multivitamin with minerals  1 tablet Oral Daily   pantoprazole  40 mg Oral BID   pravastatin  20 mg Oral q1800   [START ON 01/15/2020] predniSONE  20 mg Oral Q breakfast   Sirolimus  1 mg Oral Daily   sodium chloride HYPERTONIC  4 mL Nebulization BID   [START ON 01/15/2020] sulfamethoxazole-trimethoprim  1 tablet Oral Once per day on Mon Wed Fri   tacrolimus  1 mg Oral BID   tamsulosin  0.4 mg Oral QPC breakfast   valGANciclovir  450 mg Oral QODAY    Results for orders placed or performed during the hospital encounter of 01/13/20 (from the past 48 hour(s))  Basic metabolic panel     Status: Abnormal   Collection Time: 01/13/20  8:12 PM  Result  Value Ref Range   Sodium 132 (L) 135 - 145 mmol/L   Potassium 2.9 (L) 3.5 - 5.1 mmol/L   Chloride 96 (L) 98 - 111 mmol/L   CO2 23 22 - 32 mmol/L   Glucose, Bld 274 (H) 70 - 99 mg/dL    Comment: Glucose reference range applies only to samples taken after fasting for at least 8 hours.   BUN 45 (H) 8 - 23 mg/dL   Creatinine, Ser 2.56 (H) 0.61 - 1.24 mg/dL   Calcium 8.1 (L) 8.9 - 10.3 mg/dL   GFR calc non Af Amer 24 (L) >60 mL/min   GFR calc Af Amer 28 (L) >60 mL/min   Anion gap 13 5 - 15    Comment: Performed at [facility], [address] [place], [place] [address]  CBC     Status: Abnormal   Collection Time: 01/13/20  8:12 PM  Result Value Ref Range   WBC 10.8 (H) 4.0 - 10.5 K/uL   RBC 3.85 (L) 4.22 - 5.81 MIL/uL   Hemoglobin 10.1 (L) 13.0 - 17.0 g/dL   HCT 31.9 (L) 39 - 52 %   MCV 82.9 80.0 - 100.0 fL   MCH 26.2 26.0 - 34.0 pg   MCHC 31.7 30.0 - 36.0 g/dL   RDW 14.5 11.5 - 15.5 %   Platelets 143 (L) 150 - 400 K/uL   nRBC 0.0 0.0 - 0.2 %    Comment: Performed at [facility], [address] [place], [place] [address]  SARS Coronavirus 2 by RT PCR (hospital order, performed in [facility] hospital lab) Nasopharyngeal Nasopharyngeal Swab     Status: None   Collection Time: 01/13/20  8:12 PM   Specimen: Nasopharyngeal Swab  Result Value Ref Range   SARS Coronavirus 2 NEGATIVE NEGATIVE    Comment: (NOTE) SARS-CoV-2 target nucleic acids are NOT DETECTED.  The SARS-CoV-2 RNA is generally detectable in upper and lower respiratory specimens during the acute phase of infection. The lowest concentration of SARS-CoV-2 viral copies this assay can detect is 250 copies / mL. A negative result does not preclude SARS-CoV-2 infection and should not be used as the sole basis for treatment or other patient management decisions.  A negative result may occur with improper specimen collection / handling, submission of specimen other than nasopharyngeal swab, presence of  viral mutation(s) within the areas targeted by this assay, and inadequate number of viral copies (<250 copies / mL). A negative result must be combined with clinical observations, patient history, and epidemiological information.  Fact Sheet for Patients:   StrictlyIdeas.no  Fact Sheet for Healthcare Providers: BankingDealers.co.za  This test is not yet approved or  cleared by the [place] FDA and has been authorized for detection and/or diagnosis of SARS-CoV-2 by FDA under an Emergency Use Authorization (EUA).  This EUA will remain in effect (meaning this test can be used) for the duration of the COVID-19 declaration under Section 564(b)(1) of the Act, 21 U.S.C. section 360bbb-3(b)(1), unless the authorization is terminated or revoked sooner.  Performed at [facility], [address] [place], [place] [address]   Glucose, capillary     Status: Abnormal   Collection Time: 01/13/20  8:24 PM  Result Value Ref Range   Glucose-Capillary 273 (H) 70 - 99 mg/dL    Comment: Glucose reference range applies only to samples taken after fasting for at least 8 hours.  Basic metabolic panel     Status: Abnormal   Collection Time: 01/14/20  5:09 AM  Result Value Ref Range   Sodium 134 (L) 135 - 145 mmol/L   Potassium 3.6 3.5 - 5.1 mmol/L   Chloride 97 (L) 98 - 111 mmol/L   CO2 27 22 - 32 mmol/L   Glucose, Bld 164 (H) 70 - 99 mg/dL    Comment: Glucose reference range applies only to samples taken after fasting for at least 8 hours.   BUN 44 (H) 8 - 23 mg/dL   Creatinine, Ser 2.53 (H) 0.61 - 1.24 mg/dL   Calcium 8.2 (L) 8.9 - 10.3 mg/dL   GFR calc non Af Amer 25 (L) >60 mL/min   GFR calc Af Amer 28 (L) >60 mL/min   Anion gap 10 5 - 15    Comment: Performed at [facility], [address] [place], [place] [address]  Hemoglobin A1c     Status: Abnormal   Collection Time: 01/14/20  5:09 AM  Result Value Ref Range   Hgb  A1c MFr Bld 10.3 (H) 4.8 - 5.6 %    Comment: (NOTE) Pre diabetes:          5.7%-6.4%  Diabetes:              >6.4%  Glycemic control for   <7.0% adults with diabetes    Mean Plasma Glucose 248.91 mg/dL    Comment: Performed at [facility] [address], [place], [place] [address]  Glucose, capillary     Status: Abnormal   Collection Time: 01/14/20  5:19 PM  Result Value Ref Range   Glucose-Capillary 260 (H) 70 - 99 mg/dL    Comment: Glucose reference range applies only to samples taken after fasting for at least 8 hours.   Comment 1 Notify RN    Comment 2 Document in Chart   Brain natriuretic peptide     Status: Abnormal   Collection Time: 01/14/20  6:23 PM  Result Value Ref Range   B Natriuretic Peptide 990.4 (H) 0.0 - 100.0 pg/mL    Comment: Performed at [facility], [address] [place],  [address]    DG Chest 2 View  Result Date: 01/13/2020 CLINICAL DATA:  Syncopal event with fall, former smoker, history of lung transplant EXAM: CHEST - 2 VIEW COMPARISON:  06/25/2013 chest radiograph. FINDINGS: Right internal jugular Port-A-Cath terminates over the cavoatrial junction. Three lead left subclavian ICD with lead tips overlying the right atrium, right ventricle and coronary sinus. Stable cardiomediastinal silhouette with mild cardiomegaly. No pneumothorax. No pleural effusion. Asymmetric volume loss in the left hemithorax. Patchy and reticular  opacities throughout the left lung, worsened from prior. Minimal scarring at the right lung base. Otherwise clear right lung. No displaced fractures in the visualized chest. IMPRESSION: 1. Minimal scarring at the right lung base. Otherwise clear right lung. 2. 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 3. Mild cardiomegaly. Electronically Signed   By: Ilona Sorrel M.D.   On: 01/13/2020 20:18   DG Elbow Complete Right  Result Date: 01/13/2020 CLINICAL DATA:  Syncopal episode with right  elbow pain EXAM: RIGHT ELBOW - COMPLETE 3+ VIEW COMPARISON:  None. FINDINGS: No fracture, joint effusion or dislocation. No suspicious focal osseous lesions. No significant degenerative arthropathy. No radiopaque foreign body. IMPRESSION: No right elbow fracture, joint effusion or malalignment. Electronically Signed   By: Ilona Sorrel M.D.   On: 01/13/2020 20:13   CT CHEST WO CONTRAST  Result Date: 01/14/2020 CLINICAL DATA:  Syncope yesterday, difficulty breathing, history of lung transplant EXAM: CT CHEST WITHOUT CONTRAST TECHNIQUE: Multidetector CT imaging of the chest was performed following the standard protocol without IV contrast. COMPARISON:  01/13/2020, 01/14/2020, 09/12/2013 FINDINGS: Cardiovascular: Multi lead pacer is identified. There is diffuse atherosclerosis of the aorta and coronary vasculature. The heart is unremarkable without pericardial effusion. Mediastinum/Nodes: No enlarged mediastinal or axillary lymph nodes. Thyroid gland, trachea, and esophagus demonstrate no significant findings. Lungs/Pleura: Chronic fibrosis and volume loss within the left lung. Postsurgical changes from right lung transplant. There are patchy areas of ground-glass airspace disease throughout the right lung, greatest in the right lower lobe. Findings could reflect multifocal pneumonia, hemorrhage, drug reaction, or early edema. No pleural effusion or pneumothorax. Central airways are patent. Upper Abdomen: No acute abnormality. Musculoskeletal: No acute or destructive bony lesions. Reconstructed images demonstrate no additional findings. IMPRESSION: 1. Patchy ground-glass airspace disease throughout the right lung transplant. Favor multifocal pneumonia, though hemorrhage or drug reaction could also give this appearance. Edema is felt to be less likely without associated effusion or cardiomegaly. 2. Chronic scarring and fibrosis throughout the left lung, with severe left lung volume loss. 3.  Aortic Atherosclerosis  (ICD10-I70.0). Electronically Signed   By: Randa Ngo M.D.   On: 01/14/2020 17:03   NM Pulmonary Perfusion  Result Date: 01/14/2020 CLINICAL DATA:  History of RIGHT lung transplant. Short of breath, concern for pulmonary embolism. EXAM: NUCLEAR MEDICINE PERFUSION LUNG SCAN TECHNIQUE: Perfusion images were obtained in multiple projections after intravenous injection of radiopharmaceutical. RADIOPHARMACEUTICALS:  4.6 mCi Tc-4m MAA COMPARISON:  Chest radiograph 09/13/2019, CT 09/12/2013 FINDINGS: There is near absent perfusion to the LEFT lung which corresponds to near complete opacification on comparison radiograph. History of chronic interstitial lung disease on the LEFT. A small peripheral perfusion defects within the RIGHT lung. Findings are more suggestive of COPD than acute or chronic PE. IMPRESSION: 1. No convincing evidence acute or chronic PE within the RIGHT lung. 2. 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 3. Near absent perfusion to the LEFT lung corresponds to dense opacification on comparison radiograph and history of chronic LEFT lung fibrosis. Electronically Signed   By: Suzy Bouchard M.D.   On: 01/14/2020 15:13   US Carotid Bilateral  Result Date: 01/14/2020 CLINICAL DATA:  Syncope, hypertension, hyperlipidemia EXAM: BILATERAL CAROTID DUPLEX ULTRASOUND TECHNIQUE: Pearline Cables scale imaging, color Doppler and duplex ultrasound were performed of bilateral carotid and vertebral arteries in the neck. COMPARISON:  None. FINDINGS: Criteria: Quantification of carotid stenosis is based on velocity parameters that correlate the residual  internal carotid diameter with NASCET-based stenosis levels, using the diameter of the distal internal carotid lumen as the denominator for stenosis measurement. The following velocity measurements were obtained: RIGHT ICA: 107/41 cm/sec CCA: 56/21 cm/sec SYSTOLIC ICA/CCA RATIO:  1.3 ECA: 79  cm/sec LEFT ICA: 83/28 cm/sec CCA: 30/86 cm/sec SYSTOLIC ICA/CCA RATIO:  1.0 ECA: 85 cm/sec RIGHT CAROTID ARTERY: Minor intimal thickening and plaque formation. No hemodynamically significant right ICA stenosis, velocity elevation, or turbulent flow. Degree of narrowing less than 50%. RIGHT VERTEBRAL ARTERY:  Normal antegrade flow LEFT CAROTID ARTERY: Similar minor intimal thickening and plaque formation. No hemodynamically significant left ICA stenosis, velocity elevation, or turbulent flow. LEFT VERTEBRAL ARTERY:  Normal antegrade flow IMPRESSION: Minor carotid atherosclerosis. No hemodynamically significant ICA stenosis. Degree of narrowing less than 50% bilaterally by ultrasound criteria. Patent antegrade vertebral flow bilaterally Electronically Signed   By: Jerilynn Mages.  Shick M.D.   On: 01/14/2020 11:05   ECHOCARDIOGRAM COMPLETE  Result Date: 01/14/2020    ECHOCARDIOGRAM REPORT   Patient Name:   HAGAN VANAUKEN Date of Exam: 01/14/2020 Medical Rec #:  [id number]          Height:       69.0 in Accession #:    [id number]         Weight:       228.0 lb Date of Birth:  August 31, 1945          BSA:          2.184 m Patient Age:    11 years           BP:           Not listed in chart/Not listed                                                  in chart mmHg Patient Gender: M                  HR:           Not listed in chart bpm. Exam Location:  [facility] Procedure: 2D Echo, Cardiac Doppler and Color Doppler Indications:     Syncope 780.2  History:         Patient has no prior history of Echocardiogram examinations.                  Signs/Symptoms:Chest Pain; Risk Factors:Hypertension,                  Dyslipidemia and Diabetes.  Sonographer:     Sherrie Sport RDCS (AE) Referring Phys:  [id number] Arvella Merles MANSY Diagnosing Phys: Harrell Gave End MD  Sonographer Comments: Technically difficult study due to poor echo windows, suboptimal parasternal window, no apical window and no subcostal window. IMPRESSIONS  1. Left ventricular ejection  fraction, by estimation, is >55%. The left ventricle has normal function. Left ventricular endocardial border not optimally defined to evaluate regional wall motion. There is moderate to severe asymmetric left ventricular hypertrophy. Left ventricular diastolic function could not be evaluated.  2. Right ventricular systolic function is low normal. The right ventricular size is not well visualized. There is moderately elevated pulmonary artery systolic pressure.  3. The mitral valve is degenerative. Unable to accurately assess mitral valve regurgitation.  4. Tricuspid valve regurgitation is mild to moderate.  5. The aortic valve is tricuspid.  Aortic valve regurgitation not well assessed.  6. Moderately dilated pulmonary artery. FINDINGS  Left Ventricle: Left ventricular ejection fraction, by estimation, is >55%. The left ventricle has normal function. Left ventricular endocardial border not optimally defined to evaluate regional wall motion. The left ventricular internal cavity size was  normal in size. There is moderate to severe asymmetric left ventricular hypertrophy. Left ventricular diastolic function could not be evaluated. Right Ventricle: Pulmonary artery systolic pressure is 08-14 mmHg plus central venous pressure. The right ventricular size is not well visualized. No increase in right ventricular wall thickness. Right ventricular systolic function is low normal. There is moderately elevated pulmonary artery systolic pressure. Left Atrium: Left atrial size was not well visualized. Right Atrium: Right atrial size was not well visualized. Pericardium: The pericardium was not well visualized. Mitral Valve: The mitral valve is degenerative in appearance. There is mild thickening of the mitral valve leaflet(s). Unable to accurately assess mitral valve regurgitation. Tricuspid Valve: The tricuspid valve is normal in structure. Tricuspid valve regurgitation is mild to moderate. Aortic Valve: The aortic valve is  tricuspid. Aortic valve regurgitation not well assessed. Pulmonic Valve: The pulmonic valve was grossly normal. Pulmonic valve regurgitation is mild. No evidence of pulmonic stenosis. Aorta: The aortic root is normal in size and structure. Pulmonary Artery: The pulmonary artery is moderately dilated. Venous: The inferior vena cava was not well visualized. IAS/Shunts: The interatrial septum was not well visualized. Additional Comments: A pacer wire is visualized in the right ventricle.  LEFT VENTRICLE PLAX 2D LVIDd:         4.26 cm LVIDs:         2.70 cm LV PW:         1.34 cm LV IVS:        1.72 cm LVOT diam:     2.00 cm LVOT Area:     3.14 cm  LEFT ATRIUM         Index LA diam:    4.90 cm 2.24 cm/m                        PULMONIC VALVE AORTA                 PV Vmax:        0.76 m/s Ao Root diam: 3.20 cm PV Peak grad:   2.3 mmHg                       RVOT Peak grad: 4 mmHg  TRICUSPID VALVE TR Peak grad:   51.3 mmHg TR Vmax:        358.00 cm/s  SHUNTS Systemic Diam: 2.00 cm Nelva Bush MD Electronically signed by Nelva Bush MD Signature Date/Time: 01/14/2020/3:18:50 PM    Final    DG Hip Unilat W or Wo Pelvis 2-3 Views Right  Result Date: 01/13/2020 CLINICAL DATA:  74 year old male with fall and right hip pain. EXAM: DG HIP (WITH OR WITHOUT PELVIS) 2-3V RIGHT COMPARISON:  CT of the abdomen pelvis dated 06/17/2013. FINDINGS: Minimally displaced fracture of the right femoral neck. There is no dislocation. The bones are osteopenic. The soft tissues are unremarkable. IMPRESSION: Minimally displaced fracture of the right femoral neck. Electronically Signed   By: Anner Crete M.D.   On: 01/13/2020 20:14    Review of Systems  A 10 point review of systems was performed and it is as noted above otherwise negative.  Blood pressure (!) 141/89, pulse Marland Kitchen)  126, temperature 98.2 F (36.8 C), resp. rate (!) 36, height 5\' 9"  (1.753 m), weight 103.4 kg, SpO2 90 %. Physical Exam GENERAL: Overweight gentleman,  no acute respiratory distress but looks uncomfortable due to pain.  Currently on 6 L nasal cannula O2. HEAD: Normocephalic, atraumatic.  EYES: Pupils equal, round, reactive to light.  No scleral icterus.  MOUTH: Oral mucosa moist.  No thrush.  Oropharynx clear. NECK: Supple. No thyromegaly. Trachea midline. No JVD.  No adenopathy. PULMONARY: Diminished breath sounds on the left.  Coarse breath sounds on the right, no other adventitious sounds on the right. CARDIOVASCULAR: S1 and S2. Regular rate and rhythm.  No rubs murmurs or gallops appreciated.  There is an implanted defibrillator on the left chest. GASTROINTESTINAL: Distended abdomen, soft, normoactive bowel sounds.  No tenderness noted. MUSCULOSKELETAL: Noclubbing, the re is +2 edema of the lower extremities.  Deformity over the right hip with associated edema. NEUROLOGIC: No overt focal deficit, speech is fluent. SKIN: Intact,warm,dry.  No rashes noted. PSYCH: Mood is irritable, behavior otherwise normal.  Reviewed perfusion scan, no overt evidence of embolus on the right transplanted lung.  Hardly any perfusion to the left lung which is negative and with known fibrosis.  Review 2D echo, the patient does have asymmetric left ventricular hypertrophy which may be the culprit with regards to his syncope.  There is also moderate pulmonary hypertension.  Left ventricular ejection fraction over 55%  Chest x-ray performed yesterday:    CT chest without contrast was reviewed.  Patient has groundglass opacities throughout the right (transplanted) lung that could indicate either volume overload, acute infection, possibility of picture of acute on chronic rejection.    Assessment/Plan:  Acute respiratory failure with hypoxia Query volume overload Hx: Right lung transplant, chronic rejection  Supplemental O2 to maintain oxygen saturations above 92%  Agree with continuing pulmonary toilet and bronchodilators  Lasix x1  Continue  antirejection medications  Increase prednisone to 20 mg p.o. currently  Discussed with hospitalist, patient would benefit from transfer to [facility] for management given his history of transplant and chronic rejection current traumatic stress could exacerbate issues with this  Status post right hip fracture  Will need ORIF  Needs to be optimized prior to procedure  Acute on chronic kidney injury Urinary retention  Avoid nephrotoxins as possible  Check levels of antirejection medications  Foley catheter  Consider nephrology consult  Groundglass opacities  May be related to his chronic rejection issues  Cannot exclude volume overload  Cannot exclude infection agree with continuing cefepime  Syncope  Asymmetric left ventricular hypertrophy  Pulmonary hypertension  May need right heart cath to determine hemodynamics  Careful use of diuretics   As noted, discussed with the patient's hospitalist and recommend transfer to [place] where the patient receives his care particularly in the setting of status post transplantation and the complexities that this and his chronic transplant rejection status.     Renold Don, MD [facility] 01/14/2020, 7:39 PM   *This note was dictated using voice recognition software/Dragon.  Despite best efforts to proofread, errors can occur which can change the meaning.  Any change was purely unintentional.

## 2020-01-14 NOTE — Progress Notes (Signed)
Discussed with anesthesia, will await all test results and consults, plan surgery tomorrow pm.

## 2020-01-14 NOTE — Progress Notes (Signed)
   01/14/20 1922  Assess: MEWS Score  Temp 98.2 F (36.8 C)  BP (!) 156/118  Pulse Rate (!) 121  Resp (!) 32  SpO2 100 %  O2 Device Non-rebreather Mask  O2 Flow Rate (L/min) 15 L/min  Assess: MEWS Score  MEWS Temp 0  MEWS Systolic 0  MEWS Pulse 2  MEWS RR 2  MEWS LOC 0  MEWS Score 4  MEWS Score Color Red

## 2020-01-14 NOTE — Consult Note (Signed)
Pharmacy Antibiotic Note  Caleb [name] is a 74 y.o. male admitted on 01/13/2020 with pneumonia.    Pharmacy has been consulted for Vancomycin dosing.  Plan: Will give 2000mg  loading dose IV x 1, followed by 1250mg  q24  Will check MRSA PCR for possible de-escalation  Will change Cefepime to 2g q12h (per renal function and indication)  Height: 5\' 9"  (175.3 cm) Weight: 103.4 kg (228 lb) IBW/kg (Calculated) : 70.7  Temp (24hrs), Avg:98.3 F (36.8 C), Min:97.1 F (36.2 C), Max:99.2 F (37.3 C)  Recent Labs  Lab 01/13/20 2012 01/14/20 0509  WBC 10.8*  --   CREATININE 2.56* 2.53*    Estimated Creatinine Clearance: 31.7 mL/min (A) (by C-G formula based on SCr of 2.53 mg/dL (H)).    Allergies  Allergen Reactions  . 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    Antimicrobials this admission: Valganciclovir 8/17 >>  Vancomycin 8/17 >> Cefepime 8/17 >>  Dose adjustments this admission: N/A  Microbiology results: COVID NEG  8/17 MRSA PCR: pending  Thank you for allowing pharmacy to be a part of this patient's care.  Lu Duffel, PharmD, BCPS Clinical Pharmacist 01/14/2020 7:34 PM

## 2020-01-14 NOTE — Progress Notes (Signed)
*  PRELIMINARY RESULTS* Echocardiogram 2D Echocardiogram has been performed.  Sherrie Sport 01/14/2020, 2:04 PM

## 2020-01-14 NOTE — Progress Notes (Addendum)
PROGRESS NOTE  Caleb [name]:[id number] DOB: 1945/08/17 DOA: 01/13/2020 PCP: Caleb Guise, MD  Brief History   Caleb [name]  is a 74 y.o. Caucasian male with a known history of type 2 diabetes mellitus, hypertension, dyslipidemia and BPH, who presented to the emergency room with brief syncope while he was letting his dogs air with subsequent fall on his right side and right hip pain with inability to ambulate.  He denies any paresthesias or focal muscle weakness.  No chest pain or palpitations.  He has been having wheezing in the ER.  No cough or hemoptysis.  He stated that he just blacked out.  No dysuria, urinary frequency or urgency or flank pain.  Upon presentation to the emergency room, temperature was 99.3 respiratory rate was 22 and later 34 and pulse oximetry 88% on room air and 96 on 2 L of O2 by nasal cannula and later 97% with 6 L of O2 by nasal cannula.  Her heart rate was normal and later 108.  BMP showed hypokalemia of 2.9 with hyponatremia of 132 and hypokalemia kalemia of 96 with hyperglycemia of 274.  BUN was 45 and creatinine 2.56.  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  Right elbow x-ray showed no abnormalities. EKG showed sinus rhythm with a rate of 90 with right bundle branch block and left posterior fascicular block.  The patient was given 4 L of IV morphine sulfate twice and 4 mg of IV Zofran as well as  1 L of IV normal saline.  He will be admitted to a medically monitored bed for further evaluation and management.  The patient has been sent for VQ scan to rule out PE and pulmonology has been consulted due to the patient's history of pulmonary transplant and his acute hypoxic respiratory status.   Consultants  . Pulmonology  Procedures  . None  Antibiotics   Anti-infectives (From admission, onward)    Start     Dose/Rate Route Frequency Ordered Stop   01/15/20 0900  sulfamethoxazole-trimethoprim (BACTRIM) 400-80 MG per tablet 1 tablet     Discontinue     1 tablet Oral Once per day on Mon Wed Fri 01/13/20 2242     01/14/20 1800  valGANciclovir (VALCYTE) 450 MG tablet TABS 450 mg     Discontinue     450 mg Oral Every other day 01/13/20 2242     01/14/20 1500  ceFAZolin (ANCEF) IVPB 2g/100 mL premix     Discontinue     2 g 200 mL/hr over 30 Minutes Intravenous  Once 01/14/20 0647      .  Subjective  The patient is resting comfortably in bed. He appears acutely ill.   Objective   Vitals:  Vitals:   01/14/20 1010 01/14/20 1243  BP: 104/64   Pulse: 99   Resp: 16   Temp: 99.1 F (37.3 C)   SpO2: 95% 96%   Exam:  Constitutional:  . The patient is awake, alert, and oriented x 3. No acute distress. Respiratory:  . Positive for increased work of breathing. Marland Kitchen Positive for rales throughout. . No wheezes or rhonchi . No tactile fremitus Cardiovascular:  . Regular rate and rhythm . No murmurs, ectopy, or gallups. . No lateral PMI. No thrills. Abdomen:  . Abdomen is soft, non-tender, non-distended . No hernias, masses, or organomegaly .  Normoactive bowel sounds.  Musculoskeletal:  . No cyanosis, clubbing, or edema Skin:  . No rashes, lesions, ulcers . palpation of skin: no induration or nodules Neurologic:  . CN 2-12 intact . Sensation all 4 extremities intact Psychiatric:  . Mental status o Mood, affect appropriate o Orientation to person, place, time  . judgment and insight appear intact I have personally reviewed the following:   Today's Data  . Vitals, BMP, Glucoses  Imaging  . CTA chest . CXR  Scheduled Meds: . cholecalciferol  1,000 Units Oral Daily  . dapagliflozin propanediol  10 mg Oral 1 day or 1 dose  . furosemide  20 mg Oral Daily  . ipratropium-albuterol  3 mL Nebulization QID  . loratadine  10 mg Oral Daily  . metoprolol succinate  50 mg Oral  1 day or 1 dose  . metoprolol tartrate  25 mg Oral BID  . mometasone-formoterol  2 puff Inhalation BID  . multivitamin with minerals  1 tablet Oral Daily  . pantoprazole  40 mg Oral BID  . pravastatin  20 mg Oral q1800  . predniSONE  5 mg Oral Q breakfast  . Sirolimus  1 mg Oral Daily  . sodium chloride HYPERTONIC  4 mL Nebulization BID  . [START ON 01/15/2020] sulfamethoxazole-trimethoprim  1 tablet Oral Once per day on Mon Wed Fri  . tacrolimus  1 mg Oral BID  . tamsulosin  0.4 mg Oral QPC breakfast  . valGANciclovir  450 mg Oral QODAY   Continuous Infusions: . sodium chloride 100 mL/hr at 01/14/20 0304  .  ceFAZolin (ANCEF) IV      Active Problems:   Closed right hip fracture (HCC)   LOS: 1 day   A & P  Right hip fracture status post fall due to syncope:The patient has been admitted to a monitored bed. She is receiving pain management. Orthopedic surgery has been consulted.  Syncope: Echocardiogram has been ordered. The patient will be monitored on telemetry. Carotid Doppler is pending. Possibly due to hypoxia.  Acute hypoxic respiratory failure: Prior to this admission, the patient had not required oxygen at home. VQ scan has been performed to rule out pulmonary embolus. Results are pending. The patient is currently requiring 6 liters to maintain oxygen saturation of 96%. Pulmonology is consulted.   COPD exacerbation: Steroids held currently due to patient's need for surgery to repair her hip fracture. Continue nebulizer treatments and symbicort. Supplemental O2 given.   Type II diabetes mellitus: The patient is receiving NPH 3 units bid as well as sensitive dose SSI to follow FSBS. Wilder Glade is held.   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 Pulmonology consulted.  Dyslipidemia: We will continue statin therapy.  GERD: PPI therapy will be resumed.  DVT prophylaxis: SCD's. Medical prophylaxis currently postponed till  postoperative period. CODE STATUS: Full Code Family Communication: Family at bedside Disposition:   Status is: Inpatient  Remains inpatient appropriate because:Inpatient level of care appropriate due to severity of illness   Dispo: The patient is from: Home              Anticipated d/c is to: SNF              Anticipated d/c date is: 2 days              Patient currently is not medically stable to d/c.  Greene Diodato, DO [facility] Direct contact: see [email or web address]  7PM-7AM contact night coverage as above 01/14/2020, 2:24  PM  LOS: 1 day  ADDENDUM: I have discussed the patient with pulmonology and asked them for a consult. They have recommended that the patient be transferred to [facility], his transplant center. I have discussed the patient with Dr. Merwyn Katos of their lung transplant center. He agrees that this is the best course of action for continuity of care of the patient. They are looking into bed availability now.  Additional 32 minutes.  ADDENDUM: At 6:50 I received notification from [facility] that they had a bed for this patient. I went to go evaluate him for transfer. When I arrived there the patient had desaturated into the 70's on 10 liters. He was in extremis. He was placed on 100% NRB, Given 40 mg lasix, and a nebulizer treatment. He has been started on cefepime and vancomycin for a possible pneumonia. He was transferred to step down status. At the present time the patient is too unstable for transfer to [facility]. He will be transferred as soon as he is stable enough to make the trip. Wife at bedside.  Additional 40 minutes.

## 2020-01-14 NOTE — Anesthesia Preprocedure Evaluation (Deleted)
Anesthesia Evaluation    Airway        Dental   Pulmonary former smoker,           Cardiovascular hypertension,      Neuro/Psych    GI/Hepatic   Endo/Other  diabetes  Renal/GU      Musculoskeletal   Abdominal   Peds  Hematology   Anesthesia Other Findings   Reproductive/Obstetrics                             Anesthesia Physical Anesthesia Plan Anesthesia Quick Evaluation  

## 2020-01-14 NOTE — Progress Notes (Addendum)
Called to [facility] to in attempt to expedite transfer process and/or transportation.  Spoke with Suanne Marker. Rhonda to consult with their team and our on-call provider to determine what needs to happen next in regards to transfer...  Patient currently on BIPAP at 40% FiO2

## 2020-01-14 NOTE — H&P (Signed)
[place]   PATIENT NAME: Caleb [name]    MR#:  [id number]  DATE OF BIRTH:  Feb 05, 1946  DATE OF ADMISSION:  01/13/2020  PRIMARY CARE PHYSICIAN: Lavera Guise, MD   REQUESTING/REFERRING PHYSICIAN: Nance Pear, MD CHIEF COMPLAINT:   Chief Complaint  Patient presents with  . Shortness of Breath  . Loss of Consciousness    HISTORY OF PRESENT ILLNESS:  Caleb [name]  is a 74 y.o. Caucasian male with a known history of type 2 diabetes mellitus, hypertension, dyslipidemia and BPH, who presented to the emergency room with brief syncope while he was letting his dogs air with subsequent fall on his right side and right hip pain with inability to ambulate.  He denies any paresthesias or focal muscle weakness.  No chest pain or palpitations.  He has been having wheezing in the ER.  No cough or hemoptysis.  He stated that he just blacked out.  No dysuria, urinary frequency or urgency or flank pain.  Upon presentation to the emergency room, temperature was 99.3 respiratory rate was 22 and later 34 and pulse oximetry 88% on room air and 96 on 2 L of O2 by nasal cannula and later 97% with 6 L of O2 by nasal cannula.  Her heart rate was normal and later 108.  BMP showed hypokalemia of 2.9 with hyponatremia of 132 and hypokalemia kalemia of 96 with hyperglycemia of 274.  BUN was 45 and creatinine 2.56.  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  Right elbow x-ray showed no abnormalities. EKG showed sinus rhythm with a rate of 90 with right bundle branch block and left posterior fascicular block.  The patient was given 4 L of IV morphine sulfate twice and 4 mg of IV Zofran as well as  1 L of IV normal saline.  He will be admitted to a medically monitored bed for further evaluation and management. PAST MEDICAL HISTORY:   Past Medical  History:  Diagnosis Date  . BPH (benign prostatic hyperplasia)   . Chest pain, unspecified   . Diabetes mellitus without complication ([place])   . Dyspnea   . Hyperlipidemia   . Hypertension     PAST SURGICAL HISTORY:   Past Surgical History:  Procedure Laterality Date  . LUNG TRANSPLANT      SOCIAL HISTORY:   Social History   Tobacco Use  . Smoking status: Former Smoker    Types: Cigarettes    Quit date: 05/16/1979    Years since quitting: 40.6  . Smokeless tobacco: Never Used  . Tobacco comment: Quit 1980  Substance Use Topics  . Alcohol use: Not Currently    FAMILY HISTORY:   Family History  Problem Relation Age of Onset  . Coronary artery disease Other   . Diabetes Other     DRUG ALLERGIES:   Allergies  Allergen Reactions  . 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    REVIEW OF SYSTEMS:   ROS As per history of present illness. All pertinent systems were reviewed above. Constitutional, HEENT, cardiovascular, respiratory, GI, GU, musculoskeletal, neuro, psychiatric, endocrine, integumentary and hematologic systems were reviewed and are otherwise negative/unremarkable except for positive findings mentioned above in the HPI.   MEDICATIONS AT HOME:   Prior  to Admission medications   Medication Sig Start Date End Date Taking? Authorizing Provider  albuterol (VENTOLIN HFA) 108 (90 Base) MCG/ACT inhaler Inhale into the lungs. 04/11/18 01/13/20 Yes [provider]  azithromycin (ZITHROMAX) 250 MG tablet Take by mouth daily.   Yes [provider]  budesonide-formoterol (SYMBICORT) 160-4.5 MCG/ACT inhaler Inhale 2 puffs into the lungs 2 (two) times daily.  06/05/18 01/13/20 Yes [provider]  dapagliflozin propanediol (FARXIGA) 10 MG TABS tablet Take 1 tablet by mouth daily.  08/20/19 08/19/20 Yes [provider]  ENTRESTO 24-26 MG Take 1 tablet by mouth  every 12 (twelve) hours. 12/31/19  Yes [provider]  insulin NPH Human (NOVOLIN N) 100 UNIT/ML injection Inject 6 Units into the skin every evening. 12/16/19 12/15/20 Yes [provider]  insulin regular (NOVOLIN R) 100 units/mL injection Inject 6 Units into the skin with breakfast, with lunch, and with evening meal. 12/16/19  Yes [provider]  metoprolol succinate (TOPROL-XL) 50 MG 24 hr tablet Take 50 mg by mouth daily.  09/17/19  Yes [provider]  Multiple Vitamin (MULTI-VITAMIN) tablet Take 1 tablet by mouth daily. 08/19/19  Yes [provider]  mycophenolate (CELLCEPT) 250 MG capsule Take by mouth 2 (two) times daily. Take four capsules by mouth every twelve hours   Yes [provider]  pantoprazole (PROTONIX) 40 MG tablet Take 40 mg by mouth 2 (two) times daily.    Yes [provider]  predniSONE (DELTASONE) 5 MG tablet Take 5 mg by mouth daily with breakfast.  07/01/17  Yes [provider]  rosuvastatin (CRESTOR) 20 MG tablet Take 20 mg by mouth at bedtime. 11/15/19  Yes [provider]  Sirolimus (RAPAMUNE) 0.5 MG tablet Take 1 mg by mouth daily.  12/24/19  Yes [provider]  sulfamethoxazole-trimethoprim (BACTRIM) 400-80 MG tablet Take 1 tablet by mouth 3 (three) times a week. 08/19/19 08/18/20 Yes [provider]  tacrolimus (PROGRAF) 0.5 MG capsule 02/01/18: Take in combination with 1 mg tablets for a total of 1 mg by mouth in the morning, and 0.5 mg in the evening. 02/01/18  Yes [provider]  tacrolimus (PROGRAF) 1 MG capsule 02/01/18: Take in combination with 0.5 mg tablets for a total of 1 mg by mouth in the morning, and 0.5 mg in the evening. 02/01/18  Yes [provider]  tamsulosin (FLOMAX) 0.4 MG CAPS Take 0.4 mg by mouth daily after breakfast. 1 on saturdays   Yes [provider]  torsemide (DEMADEX) 20 MG tablet Take 40 mg by mouth 2 (two) times daily. 01/09/20  Yes  [provider]  valGANciclovir (VALCYTE) 450 MG tablet Take 450 mg by mouth every other day.   Yes [provider]  acetaminophen (TYLENOL) 325 MG tablet Take 650 mg by mouth every 6 (six) hours as needed.    [provider]  albuterol (PROVENTIL) (2.5 MG/3ML) 0.083% nebulizer solution Inhale into the lungs. 06/06/18   [provider]  fexofenadine (ALLEGRA) 180 MG tablet Take 180 mg by mouth daily.    [provider]  glucose blood (ONETOUCH VERIO) test strip TEST BLOOD SUGARS TWICE DAILY. DX E11.65. 11/27/19   Kendell Bane, NP  Immune Globulin 10% (GAMUNEX-C) 10 GM/100ML SOLN Inject into the vein. Inject 40 g into the vein monthly Patient not taking: Reported on 01/13/2020    [provider]  Isavuconazonium Sulfate (CRESEMBA) 186 MG CAPS Take by mouth. Patient not taking: Reported on 01/13/2020  [provider]  Lancets 28G MISC Use as instructed QID 08/05/14   [provider]      VITAL SIGNS:  Blood pressure 120/69, pulse (!) 104, temperature 99.2 F (37.3 C), temperature source Oral, resp. rate (!) 25, SpO2 98 %.  PHYSICAL EXAMINATION:  Physical Exam  GENERAL:  74 y.o.-year-old Caucasian male patient lying in the bed with respiratory distress with conversational dyspnea. EYES: Pupils equal, round, reactive to light and accommodation. No scleral icterus. Extraocular muscles intact.  HEENT: Head atraumatic, normocephalic. Oropharynx and nasopharynx clear.  NECK:  Supple, no jugular venous distention. No thyroid enlargement, no tenderness.  LUNGS: Diffuse expiratory wheezes with tight expiratory airflow and harsh vesicular breathing.  No rales,rhonchi or crepitation. No use of accessory muscles of respiration.  CARDIOVASCULAR: Regular rate and rhythm, S1, S2 normal. No murmurs, rubs, or gallops.  ABDOMEN: Soft, nondistended, nontender. Bowel sounds present. No organomegaly or mass.  EXTREMITIES: No pedal edema,  cyanosis, or clubbing.  NEUROLOGIC: Cranial nerves II through XII are intact. Muscle strength 5/5 in all extremities. Sensation intact. Gait not checked.  PSYCHIATRIC: The patient is alert and oriented x 3.  Normal affect and good eye contact. SKIN: No obvious rash, lesion, or ulcer.   LABORATORY PANEL:   CBC Recent Labs  Lab 01/13/20 2012  WBC 10.8*  HGB 10.1*  HCT 31.9*  PLT 143*   ------------------------------------------------------------------------------------------------------------------  Chemistries  Recent Labs  Lab 01/13/20 2012  NA 132*  K 2.9*  CL 96*  CO2 23  GLUCOSE 274*  BUN 45*  CREATININE 2.56*  CALCIUM 8.1*   ------------------------------------------------------------------------------------------------------------------  Cardiac Enzymes No results for input(s): TROPONINI in the last 168 hours. ------------------------------------------------------------------------------------------------------------------  RADIOLOGY:  DG Chest 2 View  Result Date: 01/13/2020 CLINICAL DATA:  Syncopal event with fall, former smoker, history of lung transplant EXAM: CHEST - 2 VIEW COMPARISON:  06/25/2013 chest radiograph. FINDINGS: Right internal jugular Port-A-Cath terminates over the cavoatrial junction. Three lead left subclavian ICD with lead tips overlying the right atrium, right ventricle and coronary sinus. Stable cardiomediastinal silhouette with mild cardiomegaly. No pneumothorax. No pleural effusion. Asymmetric volume loss in the left hemithorax. Patchy and reticular opacities throughout the left lung, worsened from prior. Minimal scarring at the right lung base. Otherwise clear right lung. No displaced fractures in the visualized chest. IMPRESSION: 1. Minimal scarring at the right lung base. Otherwise clear right lung. 2. 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 3. Mild cardiomegaly. Electronically Signed   By: Ilona Sorrel M.D.    On: 01/13/2020 20:18   DG Elbow Complete Right  Result Date: 01/13/2020 CLINICAL DATA:  Syncopal episode with right elbow pain EXAM: RIGHT ELBOW - COMPLETE 3+ VIEW COMPARISON:  None. FINDINGS: No fracture, joint effusion or dislocation. No suspicious focal osseous lesions. No significant degenerative arthropathy. No radiopaque foreign body. IMPRESSION: No right elbow fracture, joint effusion or malalignment. Electronically Signed   By: Ilona Sorrel M.D.   On: 01/13/2020 20:13   DG Hip Unilat W or Wo Pelvis 2-3 Views Right  Result Date: 01/13/2020 CLINICAL DATA:  74 year old male with fall and right hip pain. EXAM: DG HIP (WITH OR WITHOUT PELVIS) 2-3V RIGHT COMPARISON:  CT of the abdomen pelvis dated 06/17/2013. FINDINGS: Minimally displaced fracture of the right femoral neck. There is no dislocation. The bones are osteopenic. The soft tissues are unremarkable. IMPRESSION: Minimally displaced fracture of the right femoral neck. Electronically Signed   By: Anner Crete M.D.   On:  01/13/2020 20:14      IMPRESSION AND PLAN:   1.  Right hip fracture status post fall associated with syncope. -The patient will be admitted to a medically monitored bed. -Pain management will be provided. -Orthopedic consultation will be obtained. -Dr. Rudene Christians was notified about the patient and is aware. -He will be monitored for arrhythmias. -Well obtain a 2D echo and bilateral carotid Doppler for further assessment for syncope. -He will be monitored for hypoglycemia.  2.  COPD exacerbation. -The patient will be placed on scheduled and as needed DuoNeb's. -We will hold off on steroid therapy at this time given potential surgical intervention. -O2 protocol will be followed. -Symbicort to be continued.  3.  Type II diabetes mellitus. -The patient will be placed on supplement coverage with NovoLog. -We will continue Farxiga. -We will continue basal coverage and maintain it at half home dose when he is  n.p.o.  4.  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 -His immunosuppressants will be continued.  5.  Dyslipidemia. -We will continue statin therapy.  6.  GERD. -PPI therapy will be resumed.  7.  DVT prophylaxis. -SCDs. -Medical prophylaxis currently postponed till postoperative period.    All the records are reviewed and case discussed with ED provider. The plan of care was discussed in details with the patient (and family). I answered all questions. The patient agreed to proceed with the above mentioned plan. Further management will depend upon hospital course.   CODE STATUS: Full code  Status is: Inpatient  Remains inpatient appropriate because:Ongoing active pain requiring inpatient pain management, Ongoing diagnostic testing needed not appropriate for outpatient work up, Unsafe d/c plan, IV treatments appropriate due to intensity of illness or inability to take PO and Inpatient level of care appropriate due to severity of illness   Dispo: The patient is from: Home              Anticipated d/c is to: SNF              Anticipated d/c date is: 3 days              Patient currently is not medically stable to d/c.   TOTAL TIME TAKING CARE OF THIS PATIENT: 55 minutes.    Christel Mormon M.D on 01/14/2020 at 1:59 AM  [facility]   From 7 PM-7 AM, contact night-coverage [email or web address]  CC: Primary care physician; Lavera Guise, MD   Note: This dictation was prepared with Dragon dictation along with smaller phrase technology. Any transcriptional typo errors that result from this process are unintentional.

## 2020-01-14 NOTE — Plan of Care (Signed)
  Problem: Education: Goal: Verbalization of understanding the information provided (i.e., activity precautions, restrictions, etc) will improve Outcome: Progressing   Problem: Activity: Goal: Ability to ambulate and perform ADLs will improve Outcome: Not Progressing   Problem: Clinical Measurements: Goal: Postoperative complications will be avoided or minimized Outcome: Not Progressing   Problem: Self-Concept: Goal: Ability to maintain and perform role responsibilities to the fullest extent possible will improve Outcome: Not Progressing   Problem: Pain Management: Goal: Pain level will decrease Outcome: Progressing   Problem: Education: Goal: Ability to describe self-care measures that may prevent or decrease complications (Diabetes Survival Skills Education) will improve Outcome: Progressing Goal: Individualized Educational Video(s) Outcome: Progressing   Problem: Coping: Goal: Ability to adjust to condition or change in health will improve Outcome: Progressing   Problem: Health Behavior/Discharge Planning: Goal: Ability to identify and utilize available resources and services will improve Outcome: Progressing Goal: Ability to manage health-related needs will improve Outcome: Progressing   Problem: Metabolic: Goal: Ability to maintain appropriate glucose levels will improve Outcome: Progressing   Problem: Skin Integrity: Goal: Risk for impaired skin integrity will decrease Outcome: Progressing   Problem: Tissue Perfusion: Goal: Adequacy of tissue perfusion will improve Outcome: Progressing

## 2020-01-14 NOTE — Progress Notes (Signed)
   01/14/20 1913  Assess: MEWS Score  BP 139/78  Pulse Rate (!) 122  Resp (!) 24  SpO2 95 %  O2 Device Non-rebreather Mask  O2 Flow Rate (L/min) 15 L/min  Assess: MEWS Score  MEWS Temp 0  MEWS Systolic 0  MEWS Pulse 2  MEWS RR 1  MEWS LOC 0  MEWS Score 3  MEWS Score Color Yellow  Assess: if the MEWS score is Yellow or Red  Were vital signs taken at a resting state? Yes  Focused Assessment Change from prior assessment (see assessment flowsheet)  Early Detection of Sepsis Score *See Row Information* High  Treat  MEWS Interventions Consulted Respiratory Therapy;Escalated (See documentation below);Administered prn meds/treatments  Notify: Provider  Provider Name/Title swayze  Date Provider Notified 01/14/20  Time Provider Notified [phone]  Notification Type Call  Notification Reason Change in status  Response See new orders  Date of Provider Response 01/14/20  Time of Provider Response 1925  Notify: Rapid Response  Name of Rapid Response RN Notified Cleophus Molt, RN  Date Rapid Response Notified 01/14/20  Time Rapid Response Notified 1913  Document  Patient Outcome Transferred/level of care increased  Progress note created (see row info) Yes

## 2020-01-14 NOTE — Progress Notes (Signed)
Informed consent signed and placed in patients medical record . Witnessed by this Probation officer

## 2020-01-15 ENCOUNTER — Encounter
Admission: EM | Disposition: A | Discharge status: Other Acute Inpatient Hospital | Payer: Self-pay | Source: Home / Self Care | Attending: Internal Medicine

## 2020-01-15 DIAGNOSIS — J441 Chronic obstructive pulmonary disease with (acute) exacerbation: Secondary | ICD-10-CM

## 2020-01-15 DIAGNOSIS — E785 Hyperlipidemia, unspecified: Secondary | ICD-10-CM

## 2020-01-15 DIAGNOSIS — J189 Pneumonia, unspecified organism: Secondary | ICD-10-CM

## 2020-01-15 DIAGNOSIS — E1169 Type 2 diabetes mellitus with other specified complication: Secondary | ICD-10-CM

## 2020-01-15 DIAGNOSIS — S72001D Fracture of unspecified part of neck of right femur, subsequent encounter for closed fracture with routine healing: Secondary | ICD-10-CM

## 2020-01-15 DIAGNOSIS — N400 Enlarged prostate without lower urinary tract symptoms: Secondary | ICD-10-CM

## 2020-01-15 DIAGNOSIS — J9601 Acute respiratory failure with hypoxia: Secondary | ICD-10-CM

## 2020-01-15 LAB — GLUCOSE, CAPILLARY
Glucose-Capillary: 299 mg/dL — ABNORMAL HIGH (ref 70–99)
Glucose-Capillary: 309 mg/dL — ABNORMAL HIGH (ref 70–99)
Glucose-Capillary: 306 mg/dL — ABNORMAL HIGH (ref 70–99)
Glucose-Capillary: 327 mg/dL — ABNORMAL HIGH (ref 70–99)

## 2020-01-15 LAB — LACTIC ACID, PLASMA: Lactic Acid, Venous: 1.7 mmol/L (ref 0.5–1.9)

## 2020-01-15 LAB — MRSA PCR SCREENING: MRSA by PCR: NEGATIVE

## 2020-01-15 SURGERY — HEMIARTHROPLASTY, HIP, DIRECT ANTERIOR APPROACH, FOR FRACTURE
Anesthesia: Choice | Site: Hip | Laterality: Right

## 2020-01-15 MED ORDER — INSULIN ASPART 100 UNIT/ML ~~LOC~~ SOLN
4.0000 [IU] | Freq: Three times a day (TID) | SUBCUTANEOUS | Status: DC
  Administered 2020-01-15 – 2020-01-16 (×3): 4 [IU] via SUBCUTANEOUS
  Filled 2020-01-15: qty 1

## 2020-01-15 MED ORDER — ACETAMINOPHEN 325 MG PO TABS
650.0000 mg | ORAL_TABLET | Freq: Four times a day (QID) | ORAL | Status: DC | PRN
  Administered 2020-01-15 – 2020-01-17 (×3): 650 mg via ORAL
  Filled 2020-01-15 (×4): qty 2

## 2020-01-15 MED ORDER — INSULIN DETEMIR 100 UNIT/ML ~~LOC~~ SOLN
6.0000 [IU] | Freq: Two times a day (BID) | SUBCUTANEOUS | Status: DC
  Administered 2020-01-15 – 2020-01-16 (×2): 6 [IU] via SUBCUTANEOUS
  Filled 2020-01-15 (×3): qty 0.06

## 2020-01-15 MED ORDER — ACETAMINOPHEN 650 MG RE SUPP: 650.0000 mg | Freq: Four times a day (QID) | RECTAL | Status: DC | PRN

## 2020-01-15 SURGICAL SUPPLY — 55 items
BLADE SAGITTAL AGGR TOOTH XLG (BLADE) ×3 IMPLANT
BNDG COHESIVE 6X5 TAN STRL LF (GAUZE/BANDAGES/DRESSINGS) ×9 IMPLANT
CANISTER SUCT 1200ML W/VALVE (MISCELLANEOUS) ×3 IMPLANT
CANISTER WOUND CARE 500ML ATS (WOUND CARE) ×3 IMPLANT
CHLORAPREP W/TINT 26 (MISCELLANEOUS) ×3 IMPLANT
COVER BACK TABLE REUSABLE LG (DRAPES) ×3 IMPLANT
COVER WAND RF STERILE (DRAPES) ×3 IMPLANT
DRAPE 3/4 80X56 (DRAPES) ×9 IMPLANT
DRAPE C-ARM XRAY 36X54 (DRAPES) ×3 IMPLANT
DRAPE INCISE IOBAN 66X60 STRL (DRAPES) IMPLANT
DRAPE POUCH INSTRU U-SHP 10X18 (DRAPES) ×3 IMPLANT
DRESSING SURGICEL FIBRLLR 1X2 (HEMOSTASIS) ×2 IMPLANT
DRSG OPSITE POSTOP 4X8 (GAUZE/BANDAGES/DRESSINGS) ×6 IMPLANT
DRSG SURGICEL FIBRILLAR 1X2 (HEMOSTASIS) ×6
ELECT BLADE 6.5 EXT (BLADE) ×3 IMPLANT
ELECT REM PT RETURN 9FT ADLT (ELECTROSURGICAL) ×3
ELECTRODE REM PT RTRN 9FT ADLT (ELECTROSURGICAL) ×1 IMPLANT
GLOVE BIOGEL PI IND STRL 9 (GLOVE) ×1 IMPLANT
GLOVE BIOGEL PI INDICATOR 9 (GLOVE) ×2
GLOVE SURG SYN 9.0  PF PI (GLOVE) ×4
GLOVE SURG SYN 9.0 PF PI (GLOVE) ×2 IMPLANT
GOWN SRG 2XL LVL 4 RGLN SLV (GOWNS) ×1 IMPLANT
GOWN STRL NON-REIN 2XL LVL4 (GOWNS) ×2
GOWN STRL REUS W/ TWL LRG LVL3 (GOWN DISPOSABLE) ×1 IMPLANT
GOWN STRL REUS W/TWL LRG LVL3 (GOWN DISPOSABLE) ×2
HEMOVAC 400CC 10FR (MISCELLANEOUS) IMPLANT
HOLDER FOLEY CATH W/STRAP (MISCELLANEOUS) ×3 IMPLANT
HOOD PEEL AWAY FLYTE STAYCOOL (MISCELLANEOUS) ×3 IMPLANT
KIT PREVENA INCISION MGT 13 (CANNISTER) ×3 IMPLANT
MAT ABSORB  FLUID 56X50 GRAY (MISCELLANEOUS) ×2
MAT ABSORB FLUID 56X50 GRAY (MISCELLANEOUS) ×1 IMPLANT
NDL SAFETY ECLIPSE 18X1.5 (NEEDLE) ×1 IMPLANT
NEEDLE HYPO 18GX1.5 SHARP (NEEDLE) ×2
NEEDLE SPNL 20GX3.5 QUINCKE YW (NEEDLE) ×6 IMPLANT
NS IRRIG 1000ML POUR BTL (IV SOLUTION) ×3 IMPLANT
PACK HIP COMPR (MISCELLANEOUS) ×3 IMPLANT
SCALPEL PROTECTED #10 DISP (BLADE) ×6 IMPLANT
SOL PREP PVP 2OZ (MISCELLANEOUS) ×3
SOLUTION PREP PVP 2OZ (MISCELLANEOUS) ×1 IMPLANT
SPONGE DRAIN TRACH 4X4 STRL 2S (GAUZE/BANDAGES/DRESSINGS) ×3 IMPLANT
STAPLER SKIN PROX 35W (STAPLE) ×3 IMPLANT
STRAP SAFETY 5IN WIDE (MISCELLANEOUS) ×3 IMPLANT
SUT DVC 2 QUILL PDO  T11 36X36 (SUTURE) ×2
SUT DVC 2 QUILL PDO T11 36X36 (SUTURE) ×1 IMPLANT
SUT SILK 0 (SUTURE) ×2
SUT SILK 0 30XBRD TIE 6 (SUTURE) ×1 IMPLANT
SUT V-LOC 90 ABS DVC 3-0 CL (SUTURE) ×3 IMPLANT
SUT VIC AB 1 CT1 36 (SUTURE) ×3 IMPLANT
SYR 20ML LL LF (SYRINGE) ×3 IMPLANT
SYR 30ML LL (SYRINGE) ×3 IMPLANT
SYR 50ML LL SCALE MARK (SYRINGE) ×6 IMPLANT
SYR BULB IRRIG 60ML STRL (SYRINGE) ×3 IMPLANT
TAPE MICROFOAM 4IN (TAPE) ×3 IMPLANT
TOWEL OR 17X26 4PK STRL BLUE (TOWEL DISPOSABLE) ×3 IMPLANT
TRAY FOLEY MTR SLVR 16FR STAT (SET/KITS/TRAYS/PACK) ×3 IMPLANT

## 2020-01-15 NOTE — Progress Notes (Signed)
Attempted to call wife as requested to update on [facility] transfer and patient condition post RRT. No answer. Voicemail not set up. Assigned RN amanda updated in case wife returned call.

## 2020-01-15 NOTE — Progress Notes (Signed)
Cross Cover Brief Note Discussed with Hospital Service and ICU fellow through [facility] transfer center Secondary to patient now on /bipap, is on wait list for ICU there ABG post 2 hours of bipap looks very well and patient has been maintaining sate sand without distress 7.36 40 73 22.6 With bipap 40% 12/6 rate 8  Procalcitonin elevated 0.63 increased WBC - 13.5 Lactic acid normal vanc added to cefepime started earlier for his pneumonia Daily prednisone stopped and solumedrol started 40 mg IV BID for acute exacerbation of his fibrosis Patient is stable enough to trnasfer to progressive care rather than ICU at this time

## 2020-01-15 NOTE — Progress Notes (Signed)
Able to reach wife to update on patient move to 2A. New room number given. Updated that we were unable to move to [facility] last night due to bed availability after his increased oxygen needs. Wife verbalized understanding.

## 2020-01-15 NOTE — Progress Notes (Signed)
Patient transferred to PCU RM 248 with RN, RT and NT on BIPAP.

## 2020-01-15 NOTE — Progress Notes (Signed)
Inpatient Diabetes Program Recommendations  AACE/ADA: New Consensus Statement on Inpatient Glycemic Control (2015)  Target Ranges:  Prepandial:   less than 140 mg/dL      Peak postprandial:   less than 180 mg/dL (1-2 hours)      Critically ill patients:  140 - 180 mg/dL   Lab Results  Component Value Date   GLUCAP 309 (H) 01/15/2020   HGBA1C 10.3 (H) 01/14/2020    Review of Glycemic Control Results for Caleb [name], Caleb [name] (MRN [id number]) as of 01/15/2020 11:49  Ref. Range 01/13/2020 20:24 01/14/2020 17:19 01/14/2020 20:59 01/15/2020 07:41 01/15/2020 11:17  Glucose-Capillary Latest Ref Range: 70 - 99 mg/dL 273 (H) 260 (H) 245 (H) 299 (H) 309 (H)   Diabetes history: Dm 2 Outpatient Diabetes medications:  NPH 6 units q HS, Novolog 6 units tid with meals Current orders for Inpatient glycemic control:  Levemir 3 units bid, Novolog sensitive tid with meals Solumedrol 40 mg IV q 12 hours Inpatient Diabetes Program Recommendations:   A1C indicates poor control of DM prior to admit.  Patient was seen at [facility] in July of 2021.  Please increase Levemir to 6 units bid and add Novolog 4 units tid with meals.   Thanks,   Adah Perl, RN, BC-ADM Inpatient Diabetes Coordinator Pager [phone] (8a-5p)

## 2020-01-15 NOTE — Progress Notes (Addendum)
Patient ID: Caleb [name], male   DOB: [birth year], 74 y.o.   MRN: [id number] [facility] PROGRESS NOTE  Caleb [name] ZYS:[id number] DOB: 1945/07/31 DOA: 01/13/2020 PCP: Lavera Guise, MD  HPI/Subjective: Patient seen this morning.  He was on BiPAP 40% when I saw him.  He stated he has some cough and coughed up a little bit of blood.  Has some shortness of breath.  Objective: Vitals:   01/15/20 1122 01/15/20 1132  BP:    Pulse:    Resp:    Temp:    SpO2: 100% 97%    Intake/Output Summary (Last 24 hours) at 01/15/2020 1429 Last data filed at 01/15/2020 1034 Gross per 24 hour  Intake 785.71 ml  Output 2200 ml  Net -1414.29 ml   Filed Weights   01/14/20 1200  Weight: 103.4 kg    ROS: Review of Systems  Respiratory: Positive for cough, hemoptysis and shortness of breath.   Cardiovascular: Negative for chest pain.  Gastrointestinal: Negative for abdominal pain, nausea and vomiting.   Exam: Physical Exam HENT:     Nose: No mucosal edema.     Mouth/Throat:     Pharynx: No oropharyngeal exudate.  Eyes:     General: Lids are normal.     Extraocular Movements: Extraocular movements intact.     Conjunctiva/sclera: Conjunctivae normal.     Pupils: Pupils are equal, round, and reactive to light.  Cardiovascular:     Rate and Rhythm: Normal rate and regular rhythm.     Heart sounds: Normal heart sounds, S1 normal and S2 normal.  Pulmonary:     Breath sounds: Examination of the left-lower field reveals decreased breath sounds and rhonchi. Decreased breath sounds and rhonchi present. No wheezing or rales.  Abdominal:     Palpations: Abdomen is soft.     Tenderness: There is no abdominal tenderness.  Musculoskeletal:     Right lower leg: No swelling.     Left lower leg: No swelling.  Skin:    General: Skin is warm.     Findings: No rash.  Neurological:     Mental Status: He is alert and oriented to person, place, and time.       Data Reviewed: Basic  Metabolic Panel: Recent Labs  Lab 01/13/20 2012 01/14/20 0509 01/14/20 2117  NA 132* 134* 132*  K 2.9* 3.6 4.3  CL 96* 97* 97*  CO2 23 27 23   GLUCOSE 274* 164* 261*  BUN 45* 44* 50*  CREATININE 2.56* 2.53* 3.01*  CALCIUM 8.1* 8.2* 8.1*  MG  --   --  2.1   Liver Function Tests: Recent Labs  Lab 01/14/20 2117  AST 26  ALT 17  ALKPHOS 58  BILITOT 1.2  PROT 6.2*  ALBUMIN 3.3*   CBC: Recent Labs  Lab 01/13/20 2012 01/14/20 2117  WBC 10.8* 13.5*  NEUTROABS  --  12.3*  HGB 10.1* 11.1*  HCT 31.9* 33.6*  MCV 82.9 81.0  PLT 143* 139*   BNP (last 3 results) Recent Labs    01/14/20 1823  BNP 990.4*    CBG: Recent Labs  Lab 01/13/20 2024 01/14/20 1719 01/14/20 2059 01/15/20 0741 01/15/20 1117  GLUCAP 273* 260* 245* 299* 309*    Recent Results (from the past 240 hour(s))  SARS Coronavirus 2 by RT PCR (hospital order, performed in [facility] hospital lab) Nasopharyngeal Nasopharyngeal Swab     Status: None   Collection Time: 01/13/20  8:12 PM   Specimen: Nasopharyngeal Swab  Result Value Ref Range Status   SARS Coronavirus 2 NEGATIVE NEGATIVE Final    Comment: (NOTE) SARS-CoV-2 target nucleic acids are NOT DETECTED.  The SARS-CoV-2 RNA is generally detectable in upper and lower respiratory specimens during the acute phase of infection. The lowest concentration of SARS-CoV-2 viral copies this assay can detect is 250 copies / mL. A negative result does not preclude SARS-CoV-2 infection and should not be used as the sole basis for treatment or other patient management decisions.  A negative result may occur with improper specimen collection / handling, submission of specimen other than nasopharyngeal swab, presence of viral mutation(s) within the areas targeted by this assay, and inadequate number of viral copies (<250 copies / mL). A negative result must be combined with clinical observations, patient history, and epidemiological information.  Fact Sheet  for Patients:   StrictlyIdeas.no  Fact Sheet for Healthcare Providers: BankingDealers.co.za  This test is not yet approved or  cleared by the [place] FDA and has been authorized for detection and/or diagnosis of SARS-CoV-2 by FDA under an Emergency Use Authorization (EUA).  This EUA will remain in effect (meaning this test can be used) for the duration of the COVID-19 declaration under Section 564(b)(1) of the Act, 21 U.S.C. section 360bbb-3(b)(1), unless the authorization is terminated or revoked sooner.  Performed at [facility], [address] [place], [place] [address]   MRSA PCR Screening     Status: None   Collection Time: 01/15/20  8:15 AM   Specimen: Nasal Mucosa; Nasopharyngeal  Result Value Ref Range Status   MRSA by PCR NEGATIVE NEGATIVE Final    Comment:        The GeneXpert MRSA Assay (FDA approved for NASAL specimens only), is one component of a comprehensive MRSA colonization surveillance program. It is not intended to diagnose MRSA infection nor to guide or monitor treatment for MRSA infections. Performed at [facility], [address] [place], [place] [address]      Studies: DG Chest 2 View  Result Date: 01/13/2020 CLINICAL DATA:  Syncopal event with fall, former smoker, history of lung transplant EXAM: CHEST - 2 VIEW COMPARISON:  06/25/2013 chest radiograph. FINDINGS: Right internal jugular Port-A-Cath terminates over the cavoatrial junction. Three lead left subclavian ICD with lead tips overlying the right atrium, right ventricle and coronary sinus. Stable cardiomediastinal silhouette with mild cardiomegaly. No pneumothorax. No pleural effusion. Asymmetric volume loss in the left hemithorax. Patchy and reticular opacities throughout the left lung, worsened from prior. Minimal scarring at the right lung base. Otherwise clear right lung. No displaced fractures in the visualized chest.  IMPRESSION: 1. Minimal scarring at the right lung base. Otherwise clear right lung. 2. 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 3. Mild cardiomegaly. Electronically Signed   By: Ilona Sorrel M.D.   On: 01/13/2020 20:18   DG Elbow Complete Right  Result Date: 01/13/2020 CLINICAL DATA:  Syncopal episode with right elbow pain EXAM: RIGHT ELBOW - COMPLETE 3+ VIEW COMPARISON:  None. FINDINGS: No fracture, joint effusion or dislocation. No suspicious focal osseous lesions. No significant degenerative arthropathy. No radiopaque foreign body. IMPRESSION: No right elbow fracture, joint effusion or malalignment. Electronically Signed   By: Ilona Sorrel M.D.   On: 01/13/2020 20:13   CT CHEST WO CONTRAST  Result Date: 01/14/2020 CLINICAL DATA:  Syncope yesterday, difficulty breathing, history of lung transplant EXAM: CT CHEST WITHOUT CONTRAST TECHNIQUE: Multidetector CT imaging of the chest was performed following the  standard protocol without IV contrast. COMPARISON:  01/13/2020, 01/14/2020, 09/12/2013 FINDINGS: Cardiovascular: Multi lead pacer is identified. There is diffuse atherosclerosis of the aorta and coronary vasculature. The heart is unremarkable without pericardial effusion. Mediastinum/Nodes: No enlarged mediastinal or axillary lymph nodes. Thyroid gland, trachea, and esophagus demonstrate no significant findings. Lungs/Pleura: Chronic fibrosis and volume loss within the left lung. Postsurgical changes from right lung transplant. There are patchy areas of ground-glass airspace disease throughout the right lung, greatest in the right lower lobe. Findings could reflect multifocal pneumonia, hemorrhage, drug reaction, or early edema. No pleural effusion or pneumothorax. Central airways are patent. Upper Abdomen: No acute abnormality. Musculoskeletal: No acute or destructive bony lesions. Reconstructed images demonstrate no additional findings. IMPRESSION: 1. Patchy ground-glass  airspace disease throughout the right lung transplant. Favor multifocal pneumonia, though hemorrhage or drug reaction could also give this appearance. Edema is felt to be less likely without associated effusion or cardiomegaly. 2. Chronic scarring and fibrosis throughout the left lung, with severe left lung volume loss. 3.  Aortic Atherosclerosis (ICD10-I70.0). Electronically Signed   By: Randa Ngo M.D.   On: 01/14/2020 17:03   NM Pulmonary Perfusion  Result Date: 01/14/2020 CLINICAL DATA:  History of RIGHT lung transplant. Short of breath, concern for pulmonary embolism. EXAM: NUCLEAR MEDICINE PERFUSION LUNG SCAN TECHNIQUE: Perfusion images were obtained in multiple projections after intravenous injection of radiopharmaceutical. RADIOPHARMACEUTICALS:  4.6 mCi Tc-79m MAA COMPARISON:  Chest radiograph 09/13/2019, CT 09/12/2013 FINDINGS: There is near absent perfusion to the LEFT lung which corresponds to near complete opacification on comparison radiograph. History of chronic interstitial lung disease on the LEFT. A small peripheral perfusion defects within the RIGHT lung. Findings are more suggestive of COPD than acute or chronic PE. IMPRESSION: 1. No convincing evidence acute or chronic PE within the RIGHT lung. 2. 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 3. Near absent perfusion to the LEFT lung corresponds to dense opacification on comparison radiograph and history of chronic LEFT lung fibrosis. Electronically Signed   By: Suzy Bouchard M.D.   On: 01/14/2020 15:13   US Carotid Bilateral  Result Date: 01/14/2020 CLINICAL DATA:  Syncope, hypertension, hyperlipidemia EXAM: BILATERAL CAROTID DUPLEX ULTRASOUND TECHNIQUE: Pearline Cables scale imaging, color Doppler and duplex ultrasound were performed of bilateral carotid and vertebral arteries in the neck. COMPARISON:  None. FINDINGS: Criteria: Quantification of carotid stenosis is based  on velocity parameters that correlate the residual internal carotid diameter with NASCET-based stenosis levels, using the diameter of the distal internal carotid lumen as the denominator for stenosis measurement. The following velocity measurements were obtained: RIGHT ICA: 107/41 cm/sec CCA: 18/29 cm/sec SYSTOLIC ICA/CCA RATIO:  1.3 ECA: 79 cm/sec LEFT ICA: 83/28 cm/sec CCA: 93/71 cm/sec SYSTOLIC ICA/CCA RATIO:  1.0 ECA: 85 cm/sec RIGHT CAROTID ARTERY: Minor intimal thickening and plaque formation. No hemodynamically significant right ICA stenosis, velocity elevation, or turbulent flow. Degree of narrowing less than 50%. RIGHT VERTEBRAL ARTERY:  Normal antegrade flow LEFT CAROTID ARTERY: Similar minor intimal thickening and plaque formation. No hemodynamically significant left ICA stenosis, velocity elevation, or turbulent flow. LEFT VERTEBRAL ARTERY:  Normal antegrade flow IMPRESSION: Minor carotid atherosclerosis. No hemodynamically significant ICA stenosis. Degree of narrowing less than 50% bilaterally by ultrasound criteria. Patent antegrade vertebral flow bilaterally Electronically Signed   By: Jerilynn Mages.  Shick M.D.   On: 01/14/2020 11:05   DG Chest Port 1 View  Result Date: 01/14/2020 CLINICAL DATA:  74 year old male with history of acute  respiratory distress. EXAM: PORTABLE CHEST 1 VIEW COMPARISON:  Chest x-ray 01/13/2020. FINDINGS: Extensive areas of interstitial prominence and patchy airspace opacities are noted in the lungs bilaterally (left greater than right). Near complete opacification of the left hemithorax which corresponds to extensive fibrotic changes noted on recent chest CT. No pleural effusions. No evidence of pulmonary edema. No pneumothorax. Heart size is normal. Mediastinal contours are distorted in shifted into the left hemithorax related to patient's chronic left-sided volume loss. Aortic atherosclerosis. Right internal jugular single-lumen porta cath with tip terminating at the superior  cavoatrial junction. Left-sided biventricular pacemaker/AICD with lead tips projecting over the expected location of the right atrium, right ventricle and overlying the lateral wall the left ventricle via the coronary sinus and coronary veins. IMPRESSION: 1. Chronic lung changes related to underlying pulmonary fibrosis and likely superimposed acute bronchopneumonia, as above, with slightly worsened aeration in the right lung when compared to the recent prior examination. Electronically Signed   By: Vinnie Langton M.D.   On: 01/14/2020 20:17   ECHOCARDIOGRAM COMPLETE  Result Date: 01/14/2020    ECHOCARDIOGRAM REPORT   Patient Name:   Caleb [name] Date of Exam: 01/14/2020 Medical Rec #:  [id number]          Height:       69.0 in Accession #:    [id number]         Weight:       228.0 lb Date of Birth:  Jun 11, 1945          BSA:          2.184 m Patient Age:    51 years           BP:           Not listed in chart/Not listed                                                  in chart mmHg Patient Gender: M                  HR:           Not listed in chart bpm. Exam Location:  [facility] Procedure: 2D Echo, Cardiac Doppler and Color Doppler Indications:     Syncope 780.2  History:         Patient has no prior history of Echocardiogram examinations.                  Signs/Symptoms:Chest Pain; Risk Factors:Hypertension,                  Dyslipidemia and Diabetes.  Sonographer:     Sherrie Sport RDCS (AE) Referring Phys:  [id number] Arvella Merles MANSY Diagnosing Phys: Harrell Gave End MD  Sonographer Comments: Technically difficult study due to poor echo windows, suboptimal parasternal window, no apical window and no subcostal window. IMPRESSIONS  1. Left ventricular ejection fraction, by estimation, is >55%. The left ventricle has normal function. Left ventricular endocardial border not optimally defined to evaluate regional wall motion. There is moderate to severe asymmetric left ventricular hypertrophy. Left ventricular diastolic  function could not be evaluated.  2. Right ventricular systolic function is low normal. The right ventricular size is not well visualized. There is moderately elevated pulmonary artery systolic pressure.  3. The mitral valve is degenerative. Unable to accurately assess mitral  valve regurgitation.  4. Tricuspid valve regurgitation is mild to moderate.  5. The aortic valve is tricuspid. Aortic valve regurgitation not well assessed.  6. Moderately dilated pulmonary artery. FINDINGS  Left Ventricle: Left ventricular ejection fraction, by estimation, is >55%. The left ventricle has normal function. Left ventricular endocardial border not optimally defined to evaluate regional wall motion. The left ventricular internal cavity size was  normal in size. There is moderate to severe asymmetric left ventricular hypertrophy. Left ventricular diastolic function could not be evaluated. Right Ventricle: Pulmonary artery systolic pressure is 09-32 mmHg plus central venous pressure. The right ventricular size is not well visualized. No increase in right ventricular wall thickness. Right ventricular systolic function is low normal. There is moderately elevated pulmonary artery systolic pressure. Left Atrium: Left atrial size was not well visualized. Right Atrium: Right atrial size was not well visualized. Pericardium: The pericardium was not well visualized. Mitral Valve: The mitral valve is degenerative in appearance. There is mild thickening of the mitral valve leaflet(s). Unable to accurately assess mitral valve regurgitation. Tricuspid Valve: The tricuspid valve is normal in structure. Tricuspid valve regurgitation is mild to moderate. Aortic Valve: The aortic valve is tricuspid. Aortic valve regurgitation not well assessed. Pulmonic Valve: The pulmonic valve was grossly normal. Pulmonic valve regurgitation is mild. No evidence of pulmonic stenosis. Aorta: The aortic root is normal in size and structure. Pulmonary Artery: The  pulmonary artery is moderately dilated. Venous: The inferior vena cava was not well visualized. IAS/Shunts: The interatrial septum was not well visualized. Additional Comments: A pacer wire is visualized in the right ventricle.  LEFT VENTRICLE PLAX 2D LVIDd:         4.26 cm LVIDs:         2.70 cm LV PW:         1.34 cm LV IVS:        1.72 cm LVOT diam:     2.00 cm LVOT Area:     3.14 cm  LEFT ATRIUM         Index LA diam:    4.90 cm 2.24 cm/m                        PULMONIC VALVE AORTA                 PV Vmax:        0.76 m/s Ao Root diam: 3.20 cm PV Peak grad:   2.3 mmHg                       RVOT Peak grad: 4 mmHg  TRICUSPID VALVE TR Peak grad:   51.3 mmHg TR Vmax:        358.00 cm/s  SHUNTS Systemic Diam: 2.00 cm Nelva Bush MD Electronically signed by Nelva Bush MD Signature Date/Time: 01/14/2020/3:18:50 PM    Final    DG Hip Unilat W or Wo Pelvis 2-3 Views Right  Result Date: 01/13/2020 CLINICAL DATA:  74 year old male with fall and right hip pain. EXAM: DG HIP (WITH OR WITHOUT PELVIS) 2-3V RIGHT COMPARISON:  CT of the abdomen pelvis dated 06/17/2013. FINDINGS: Minimally displaced fracture of the right femoral neck. There is no dislocation. The bones are osteopenic. The soft tissues are unremarkable. IMPRESSION: Minimally displaced fracture of the right femoral neck. Electronically Signed   By: Anner Crete M.D.   On: 01/13/2020 20:14    Scheduled Meds: . Chlorhexidine Gluconate Cloth  6  each Topical Daily  . cholecalciferol  1,000 Units Oral Daily  . furosemide  20 mg Oral Daily  . insulin aspart  0-9 Units Subcutaneous TID WC  . insulin aspart  4 Units Subcutaneous TID WC  . insulin detemir  6 Units Subcutaneous BID AC & HS  . ipratropium-albuterol  3 mL Nebulization QID  . loratadine  10 mg Oral Daily  . methylPREDNISolone (SOLU-MEDROL) injection  40 mg Intravenous Q12H  . metoprolol tartrate  25 mg Oral BID  . mometasone-formoterol  2 puff Inhalation BID  . multivitamin with  minerals  1 tablet Oral Daily  . pantoprazole  40 mg Oral BID  . pravastatin  20 mg Oral q1800  . Sirolimus  1 mg Oral Daily  . sodium chloride HYPERTONIC  4 mL Nebulization BID  . sulfamethoxazole-trimethoprim  1 tablet Oral Once per day on Mon Wed Fri  . tacrolimus  1 mg Oral BID  . tamsulosin  0.4 mg Oral QPC breakfast  . valGANciclovir  450 mg Oral QODAY   Continuous Infusions: . sodium chloride 100 mL/hr at 01/14/20 0304  .  ceFAZolin (ANCEF) IV    . ceFEPime (MAXIPIME) IV 2 g (01/14/20 2243)    Assessment/Plan:   1. Acute hypoxic respiratory failure.  Patient was on BiPAP this morning 40% FiO2.  Late morning was tapered over to oxygen via nasal cannula.  Initially was on 3 that had to be turned up to 5.  Nurse will try to dial down to 4 L and monitor pulse ox.  No convincing evidence of acute or chronic PE with in the right lung.  Near absent perfusion of the left lung corresponds to dense opacifications on comparison and chronic lung fibrosis. 2. Right hip fracture.  Patient will be transferred over to [facility] for surgical procedure over there. 3. COPD exacerbation and multifocal pneumonia on IV Solu-Medrol and Maxipime.  MRSA PCR negative so discontinue vancomycin. 4. Lung transplant history continue immunosuppressive's. 5. Type 2 diabetes mellitus with hyperlipidemia on pravastatin.  Added Levemir and short-acting insulin.  Sugars high secondary to steroids. 6. BPH on Flomax 7. Acute kidney injury on chronic kidney disease stage IIIb.    Code Status:     Code Status Orders  (From admission, onward)         Start     Ordered   01/13/20 2241  Full code  Continuous        01/13/20 2242        Code Status History    This patient has a current code status but no historical code status.   Advance Care Planning Activity     Family Communication: wife at bedside Disposition Plan: Status is: Inpatient  Dispo: The patient is from: Home              Anticipated d/c is  to: Transfer to [facility]              Anticipated d/c date is: To be determined.  Currently do not have a bed availability at [facility].  Accepting physician will be Dr. Jacelyn Grip              Patient currently being treated for COPD exacerbation and pneumonia.  Consultants:  Pulmonary  Orthopedic surgery  Antibiotics:  Maxipime  Time spent: 34 minutes.  Call [facility] transfer center twice today.  [place]  [facility] [employer]

## 2020-01-16 LAB — CBC
HCT: 29.2 % — ABNORMAL LOW (ref 39.0–52.0)
Hemoglobin: 9.4 g/dL — ABNORMAL LOW (ref 13.0–17.0)
MCH: 26.6 pg (ref 26.0–34.0)
MCHC: 32.2 g/dL (ref 30.0–36.0)
MCV: 82.7 fL (ref 80.0–100.0)
Platelets: 133 10*3/uL — ABNORMAL LOW (ref 150–400)
RBC: 3.53 MIL/uL — ABNORMAL LOW (ref 4.22–5.81)
RDW: 15.4 % (ref 11.5–15.5)
WBC: 14 10*3/uL — ABNORMAL HIGH (ref 4.0–10.5)
nRBC: 0 % (ref 0.0–0.2)

## 2020-01-16 LAB — GLUCOSE, CAPILLARY
Glucose-Capillary: 386 mg/dL — ABNORMAL HIGH (ref 70–99)
Glucose-Capillary: 499 mg/dL — ABNORMAL HIGH (ref 70–99)
Glucose-Capillary: 438 mg/dL — ABNORMAL HIGH (ref 70–99)
Glucose-Capillary: 360 mg/dL — ABNORMAL HIGH (ref 70–99)

## 2020-01-16 LAB — BASIC METABOLIC PANEL
Anion gap: 14 (ref 5–15)
BUN: 86 mg/dL — ABNORMAL HIGH (ref 8–23)
CO2: 21 mmol/L — ABNORMAL LOW (ref 22–32)
Calcium: 8.4 mg/dL — ABNORMAL LOW (ref 8.9–10.3)
Chloride: 100 mmol/L (ref 98–111)
Creatinine, Ser: 3.7 mg/dL — ABNORMAL HIGH (ref 0.61–1.24)
GFR calc Af Amer: 18 mL/min — ABNORMAL LOW (ref >60)
GFR calc non Af Amer: 16 mL/min — ABNORMAL LOW (ref >60)
Glucose, Bld: 359 mg/dL — ABNORMAL HIGH (ref 70–99)
Potassium: 4.3 mmol/L (ref 3.5–5.1)
Sodium: 135 mmol/L (ref 135–145)

## 2020-01-16 MED ORDER — LOVASTATIN 20 MG PO TABS: 20.0000 mg | ORAL_TABLET | Freq: Every day | ORAL | Status: DC

## 2020-01-16 MED ORDER — IPRATROPIUM-ALBUTEROL 0.5-2.5 (3) MG/3ML IN SOLN: 3.0000 mL | Freq: Four times a day (QID) | RESPIRATORY_TRACT | Status: DC

## 2020-01-16 MED ORDER — SODIUM CHLORIDE 3 % IN NEBU: INHALATION_SOLUTION | RESPIRATORY_TRACT | Status: DC

## 2020-01-16 MED ORDER — MELATONIN 5 MG PO TABS: 5.0000 mg | ORAL_TABLET | Freq: Every evening | ORAL | 0 refills | Status: DC | PRN

## 2020-01-16 MED ORDER — METOPROLOL TARTRATE 25 MG PO TABS: 25.0000 mg | ORAL_TABLET | Freq: Two times a day (BID) | ORAL | Status: DC

## 2020-01-16 MED ORDER — INSULIN DETEMIR 100 UNIT/ML ~~LOC~~ SOLN
8.0000 [IU] | Freq: Two times a day (BID) | SUBCUTANEOUS | Status: DC
  Administered 2020-01-16 – 2020-01-17 (×2): 8 [IU] via SUBCUTANEOUS
  Filled 2020-01-16 (×4): qty 0.08

## 2020-01-16 MED ORDER — METHYLPREDNISOLONE SODIUM SUCC 40 MG IJ SOLR: 40.0000 mg | Freq: Two times a day (BID) | INTRAMUSCULAR | 0 refills | Status: DC

## 2020-01-16 MED ORDER — INSULIN ASPART 100 UNIT/ML ~~LOC~~ SOLN
6.0000 [IU] | Freq: Three times a day (TID) | SUBCUTANEOUS | Status: DC
  Administered 2020-01-16 – 2020-01-18 (×7): 6 [IU] via SUBCUTANEOUS
  Filled 2020-01-16 (×7): qty 1

## 2020-01-16 MED ORDER — INSULIN ASPART 100 UNIT/ML ~~LOC~~ SOLN: 6.0000 [IU] | Freq: Three times a day (TID) | SUBCUTANEOUS | 11 refills | Status: DC

## 2020-01-16 MED ORDER — INSULIN DETEMIR 100 UNIT/ML ~~LOC~~ SOLN: 6.0000 [IU] | Freq: Two times a day (BID) | SUBCUTANEOUS | 11 refills | Status: DC

## 2020-01-16 MED ORDER — INSULIN ASPART 100 UNIT/ML ~~LOC~~ SOLN: 4.0000 [IU] | Freq: Three times a day (TID) | SUBCUTANEOUS | 11 refills | Status: DC

## 2020-01-16 MED ORDER — INSULIN DETEMIR 100 UNIT/ML ~~LOC~~ SOLN: 8.0000 [IU] | Freq: Two times a day (BID) | SUBCUTANEOUS | 11 refills | Status: DC

## 2020-01-16 MED ORDER — SODIUM CHLORIDE 0.9 % IV SOLN: 2.0000 g | INTRAVENOUS | Status: DC

## 2020-01-16 NOTE — Progress Notes (Addendum)
Inpatient Diabetes Program Recommendations  AACE/ADA: New Consensus Statement on Inpatient Glycemic Control (2015)  Target Ranges:  Prepandial:   less than 140 mg/dL      Peak postprandial:   less than 180 mg/dL (1-2 hours)      Critically ill patients:  140 - 180 mg/dL   Results for Caleb [name], Caleb [name] (MRN [id number]) as of 01/16/2020 12:10  Ref. Range 01/15/2020 07:41 01/15/2020 11:17 01/15/2020 16:15 01/15/2020 20:33  Glucose-Capillary Latest Ref Range: 70 - 99 mg/dL 299 (H)  5 units NOVOLOG given at 10am  309 (H)  7 units NOVOLOG  306 (H)  11 units NOVOLOG  327 (H)  7 units NOVOLOG +  6 units LEVEMIR   Results for Caleb [name], Caleb [name] (MRN [id number]) as of 01/16/2020 12:10  Ref. Range 01/16/2020 08:05 01/16/2020 12:05  Glucose-Capillary Latest Ref Range: 70 - 99 mg/dL 386 (H)  13 units NOVOLOG +  6 units LEVEMIR 499 (H)   Home DM Meds: NPH 6 units QHS        Novolog 6 units tid with meals   Current Orders: Levemir 6 units BID       Novolog 0-9 units tid       Novolog 4 units MC     MD- Patient Getting Solumedrol 40 mg BID.  CBGs [phone] so far today.  Note that 2nd dose Levemir on board this AM.  Please consider the following while CBGs elevated and pt getting steroids:  1. Increase Levemir to 8 units BID (30% increase)  2. Increase Novolog Meal Coverage to 6 units TID with meals     --Will follow patient during hospitalization--  Wyn Quaker RN, MSN, CDE Diabetes Coordinator Inpatient Glycemic Control Team Team Pager: [phone] (8a-5p)

## 2020-01-16 NOTE — Care Management Important Message (Signed)
Important Message  Patient Details  Name: Caleb [name] MRN: [id number] Date of Birth: Jan 19, 1946   Medicare Important Message Given:  Yes     Loann Quill 01/16/2020, 10:54 AM

## 2020-01-16 NOTE — Discharge Summary (Addendum)
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 BPH (benign prostatic hyperplasia)   . Chest pain, unspecified   . Diabetes mellitus without complication ([place])   . Dyspnea   . Hyperlipidemia   . Hypertension     HOSPITAL COURSE:   1. Acute hypoxic respiratory failure. Patient has been on oxygen via nasal cannula since yesterday at 11:30 in the morning. The patient did have BiPAP at night but normally wears CPAP at night. Continue oxygen 4 L nasal cannula. CT scan of the chest no convincing evidence of acute or chronic PE within the right lung. VQ scan showed near absent perfusion of the left lung which corresponds to dense opacifications on comparison and chronic lung fibrosis. Respiratory status better today. 2. COPD exacerbation multifocal pneumonia. Continue IV Solu-Medrol 40 mg IV twice daily and Maxipime. MRSA PCR was negative so vancomycin was discontinued. 3. Right hip fracture. Patient will be transferred over to [facility] for surgical procedure over there. 4. History of right lung transplant. Continue immunosuppressive's. 5. Type 2 diabetes mellitus with hyperlipidemia on pravastatin. On Levemir insulin and short acting insulin secondary to  being on steroids. 6. Acute kidney injury on chronic kidney disease stage IIIb. Creatinine up a little bit today. Continue IV fluid normal saline at 75 cc per and hold Lasix 7. Anemia unspecified continue to watch creatinine 8. BPH on Flomax. Foley catheter in secondary to right hip fracture. 9. Chronic diastolic congestive heart failure. With creatinine up a bit we will hold cardiac meds at this point. I do not believe this patient has heart failure currently.  DISCHARGE CONDITIONS:   Satisfactory  CONSULTS OBTAINED:  Treatment Team:  Hessie Knows, MD  DRUG ALLERGIES:   Allergies  Allergen Reactions  . 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    DISCHARGE MEDICATIONS:   Allergies as of 01/17/2020      Reactions   Nsaids Other (See Comments)   S/p lung transplant on prograf which in combination with NSAIDs can cause renal failure.      Medication List    STOP taking these medications   albuterol (2.5 MG/3ML) 0.083% nebulizer solution Commonly known as: PROVENTIL   albuterol 108 (90 Base) MCG/ACT inhaler Commonly known as: VENTOLIN HFA   azithromycin 250 MG tablet Commonly known as: ZITHROMAX   dapagliflozin propanediol 10 MG Tabs tablet Commonly known as: FARXIGA   Entresto 24-26 MG Generic drug: sacubitril-valsartan   Lancets 28G Misc   metoprolol succinate 50 MG 24 hr tablet Commonly known as: TOPROL-XL   NovoLIN N 100 UNIT/ML injection Generic drug: insulin NPH Human   NovoLIN R 100 units/mL injection Generic drug:  insulin regular   OneTouch Verio test strip Generic drug: glucose blood   predniSONE 5 MG tablet Commonly known as: DELTASONE   rosuvastatin 20 MG tablet Commonly known as: CRESTOR   sodium chloride HYPERTONIC 3 % nebulizer solution   torsemide 20 MG tablet Commonly known as: DEMADEX     TAKE these medications   acetaminophen 325 MG tablet Commonly  known as: TYLENOL Take 650 mg by mouth every 6 (six) hours as needed.   budesonide-formoterol 160-4.5 MCG/ACT inhaler Commonly known as: SYMBICORT Inhale 2 puffs into the lungs 2 (two) times daily.   ceFEPIme 2 g in sodium chloride 0.9 % 100 mL Inject 2 g into the vein daily.   fexofenadine 180 MG tablet Commonly known as: ALLEGRA Take 180 mg by mouth daily.   insulin aspart 100 UNIT/ML injection Commonly known as: novoLOG Inject 4 Units into the skin 3 (three) times daily with meals.   insulin aspart 100 UNIT/ML injection Commonly known as: novoLOG Inject 6 Units into the skin 3 (three) times daily with meals.   insulin detemir 100 UNIT/ML injection Commonly known as: LEVEMIR Inject 0.06 mLs (6 Units total) into the skin 2 (two) times daily at 8 am and 10 pm.   insulin detemir 100 UNIT/ML injection Commonly known as: LEVEMIR Inject 0.08 mLs (8 Units total) into the skin 2 (two) times daily at 8 am and 10 pm.   insulin detemir 100 UNIT/ML injection Commonly known as: LEVEMIR Inject 0.1 mLs (10 Units total) into the skin 2 (two) times daily at 8 am and 10 pm.   ipratropium-albuterol 0.5-2.5 (3) MG/3ML Soln Commonly known as: DUONEB Take 3 mLs by nebulization 4 (four) times daily.   lovastatin 20 MG tablet Commonly known as: MEVACOR Take 1 tablet (20 mg total) by mouth daily.   melatonin 5 MG Tabs Commonly known as: Melatonin Maximum Strength Take 1 tablet (5 mg total) by mouth at bedtime as needed (sleep). What changed:   how much to take  when to take this  reasons to take this   methylPREDNISolone sodium succinate 40 mg/mL injection Commonly known as: SOLU-MEDROL Inject 1 mL (40 mg total) into the vein every 12 (twelve) hours.   metoprolol tartrate 25 MG tablet Commonly known as: LOPRESSOR Take 1 tablet (25 mg total) by mouth 2 (two) times daily. What changed:   how much to take  how to take this  when to take this  additional instructions    Multi-Vitamin tablet Take 1 tablet by mouth daily.   pantoprazole 40 MG tablet Commonly known as: PROTONIX Take 40 mg by mouth 2 (two) times daily.   Sirolimus 0.5 MG tablet Commonly known as: RAPAMUNE Take 1 mg by mouth daily.   sulfamethoxazole-trimethoprim 400-80 MG tablet Commonly known as: BACTRIM Take 1 tablet by mouth 3 (three) times a week.   tacrolimus 1 MG capsule Commonly known as: PROGRAF Take 1 mg by mouth 2 (two) times daily.   tamsulosin 0.4 MG Caps capsule Commonly known as: FLOMAX Take 0.4 mg by mouth daily after breakfast. 1 on saturdays   valGANciclovir 450 MG tablet Commonly known as: VALCYTE Take 450 mg by mouth every other day. What changed: Another medication with the same name was added. Make sure you understand how and when to take each.   valGANciclovir 450 MG tablet Commonly known as: VALCYTE Take 1 tablet (450 mg total) by mouth 2 (two) times a week. Start taking on: January 20, 2020 What changed: You were  already taking a medication with the same name, and this prescription was added. Make sure you understand how and when to take each.        DISCHARGE INSTRUCTIONS:   Follow-up with transplant team at [place] with orthopedic surgeons at [facility]  If you experience worsening of your admission symptoms, develop shortness of breath, life threatening emergency, suicidal or homicidal thoughts you must seek medical attention immediately by calling 911 or calling your MD immediately  if symptoms less severe.  You Must read complete instructions/literature along with all the possible adverse reactions/side effects for all the Medicines you take and that have been prescribed to you. Take any new Medicines after you have completely understood and accept all the possible adverse reactions/side effects.   Please note  You were cared for by a hospitalist during your hospital stay. If you have any questions about your discharge medications or the  care you received while you were in the hospital after you are discharged, you can call the unit and asked to speak with the hospitalist on call if the hospitalist that took care of you is not available. Once you are discharged, your primary care physician will handle any further medical issues. Please note that NO REFILLS for any discharge medications will be authorized once you are discharged, as it is imperative that you return to your primary care physician (or establish a relationship with a primary care physician if you do not have one) for your aftercare needs so that they can reassess your need for medications and monitor your lab values.    Today   CHIEF COMPLAINT:   Chief Complaint  Patient presents with  . Shortness of Breath  . Loss of Consciousness    HISTORY OF PRESENT ILLNESS:  Caleb [name]  is a 74 y.o. coming in with shortness of breath and syncopal episode found to have a right hip fracture   VITAL SIGNS:  Blood pressure 139/69, pulse 88, temperature 97.7 F (36.5 C), temperature source Oral, resp. rate 18, height 5\' 9"  (1.753 m), weight 103.4 kg, SpO2 100 %.   PHYSICAL EXAMINATION:  GENERAL:  74 y.o.-year-old patient lying in the bed with no acute distress.  EYES: Pupils equal, round, reactive to light and accommodation. No scleral icterus. Extraocular muscles intact.  HEENT: Head atraumatic, normocephalic. Oropharynx and nasopharynx clear.  LUNGS: Decreased breath sounds bilateral, rhonchi left lung. No use of accessory muscles of respiration.  CARDIOVASCULAR: S1, S2 normal. No murmurs, rubs, or gallops.  ABDOMEN: Soft, non-tender, non-distended.  EXTREMITIES: Trace pedal edema.  NEUROLOGIC: Cranial nerves II through XII are intact. Muscle strength 5/5 in all extremities. Sensation intact. Gait not checked.  PSYCHIATRIC: The patient is alert and oriented x 3.  SKIN: No obvious rash, lesion, or ulcer.   DATA REVIEW:   CBC Recent Labs  Lab 01/16/20 0448   WBC 14.0*  HGB 9.4*  HCT 29.2*  PLT 133*    Chemistries  Recent Labs  Lab 01/14/20 2117 01/16/20 0448 01/17/20 0751  NA 132*   < > 136  K 4.3   < > 4.2  CL 97*   < > 104  CO2 23   < > 20*  GLUCOSE 261*   < > 212*  BUN 50*   < > 99*  CREATININE 3.01*   < > 4.05*  CALCIUM 8.1*   < > 8.3*  MG 2.1  --   --   AST 26  --   --  ALT 17  --   --   ALKPHOS 58  --   --   BILITOT 1.2  --   --    < > = values in this interval not displayed.    Microbiology Results  Results for orders placed or performed during the hospital encounter of 01/13/20  SARS Coronavirus 2 by RT PCR (hospital order, performed in [facility] hospital lab) Nasopharyngeal Nasopharyngeal Swab     Status: None   Collection Time: 01/13/20  8:12 PM   Specimen: Nasopharyngeal Swab  Result Value Ref Range Status   SARS Coronavirus 2 NEGATIVE NEGATIVE Final    Comment: (NOTE) SARS-CoV-2 target nucleic acids are NOT DETECTED.  The SARS-CoV-2 RNA is generally detectable in upper and lower respiratory specimens during the acute phase of infection. The lowest concentration of SARS-CoV-2 viral copies this assay can detect is 250 copies / mL. A negative result does not preclude SARS-CoV-2 infection and should not be used as the sole basis for treatment or other patient management decisions.  A negative result may occur with improper specimen collection / handling, submission of specimen other than nasopharyngeal swab, presence of viral mutation(s) within the areas targeted by this assay, and inadequate number of viral copies (<250 copies / mL). A negative result must be combined with clinical observations, patient history, and epidemiological information.  Fact Sheet for Patients:   StrictlyIdeas.no  Fact Sheet for Healthcare Providers: BankingDealers.co.za  This test is not yet approved or  cleared by the [place] FDA and has been authorized for detection  and/or diagnosis of SARS-CoV-2 by FDA under an Emergency Use Authorization (EUA).  This EUA will remain in effect (meaning this test can be used) for the duration of the COVID-19 declaration under Section 564(b)(1) of the Act, 21 U.S.C. section 360bbb-3(b)(1), unless the authorization is terminated or revoked sooner.  Performed at [facility], [address] [place], [place] [address]   MRSA PCR Screening     Status: None   Collection Time: 01/15/20  8:15 AM   Specimen: Nasal Mucosa; Nasopharyngeal  Result Value Ref Range Status   MRSA by PCR NEGATIVE NEGATIVE Final    Comment:        The GeneXpert MRSA Assay (FDA approved for NASAL specimens only), is one component of a comprehensive MRSA colonization surveillance program. It is not intended to diagnose MRSA infection nor to guide or monitor treatment for MRSA infections. Performed at [facility], [address] [place],  [address]     RADIOLOGY:  No results found.   Management plans discussed with the patient, family and they are in agreement.  CODE STATUS:     Code Status Orders  (From admission, onward)         Start     Ordered   01/13/20 2241  Full code  Continuous        01/13/20 2242        Code Status History    This patient has a current code status but no historical code status.   Advance Care Planning Activity      TOTAL TIME TAKING CARE OF THIS PATIENT: 32 minutes.    Loletha Grayer M.D on 01/17/2020 at 3:35 PM  Between 7am to 6pm - Pager - [phone]  After 6pm go to [email or web address] - password [email or web address]  [facility]  CC: Primary care physician; Lavera Guise, MD

## 2020-01-17 DIAGNOSIS — N189 Chronic kidney disease, unspecified: Secondary | ICD-10-CM

## 2020-01-17 DIAGNOSIS — N179 Acute kidney failure, unspecified: Secondary | ICD-10-CM

## 2020-01-17 LAB — BASIC METABOLIC PANEL
Anion gap: 12 (ref 5–15)
BUN: 99 mg/dL — ABNORMAL HIGH (ref 8–23)
CO2: 20 mmol/L — ABNORMAL LOW (ref 22–32)
Calcium: 8.3 mg/dL — ABNORMAL LOW (ref 8.9–10.3)
Chloride: 104 mmol/L (ref 98–111)
Creatinine, Ser: 4.05 mg/dL — ABNORMAL HIGH (ref 0.61–1.24)
GFR calc Af Amer: 16 mL/min — ABNORMAL LOW (ref >60)
GFR calc non Af Amer: 14 mL/min — ABNORMAL LOW (ref >60)
Glucose, Bld: 212 mg/dL — ABNORMAL HIGH (ref 70–99)
Potassium: 4.2 mmol/L (ref 3.5–5.1)
Sodium: 136 mmol/L (ref 135–145)

## 2020-01-17 LAB — PROTIME-INR
INR: 1.1 (ref 0.8–1.2)
Prothrombin Time: 14.1 seconds (ref 11.4–15.2)

## 2020-01-17 LAB — GLUCOSE, CAPILLARY
Glucose-Capillary: 206 mg/dL — ABNORMAL HIGH (ref 70–99)
Glucose-Capillary: 247 mg/dL — ABNORMAL HIGH (ref 70–99)
Glucose-Capillary: 399 mg/dL — ABNORMAL HIGH (ref 70–99)
Glucose-Capillary: 362 mg/dL — ABNORMAL HIGH (ref 70–99)

## 2020-01-17 LAB — APTT: aPTT: 26 seconds (ref 24–36)

## 2020-01-17 MED ORDER — AMIODARONE LOAD VIA INFUSION
150.0000 mg | Freq: Once | INTRAVENOUS | Status: AC
  Administered 2020-01-17: 150 mg via INTRAVENOUS

## 2020-01-17 MED ORDER — OXYCODONE HCL 5 MG PO TABS
5.0000 mg | ORAL_TABLET | Freq: Four times a day (QID) | ORAL | Status: DC | PRN
  Administered 2020-01-17: 5 mg via ORAL
  Filled 2020-01-17: qty 1

## 2020-01-17 MED ORDER — INSULIN DETEMIR 100 UNIT/ML ~~LOC~~ SOLN
10.0000 [IU] | Freq: Two times a day (BID) | SUBCUTANEOUS | Status: DC
  Administered 2020-01-17 – 2020-01-18 (×2): 10 [IU] via SUBCUTANEOUS
  Filled 2020-01-17 (×3): qty 0.1

## 2020-01-17 MED ORDER — AMIODARONE IV BOLUS ONLY 150 MG/100ML
150.0000 mg | Freq: Once | INTRAVENOUS | Status: AC
  Administered 2020-01-17: 150 mg via INTRAVENOUS
  Filled 2020-01-17: qty 100

## 2020-01-17 MED ORDER — METOPROLOL TARTRATE 5 MG/5ML IV SOLN
5.0000 mg | Freq: Once | INTRAVENOUS | Status: AC
  Administered 2020-01-17: 5 mg via INTRAVENOUS

## 2020-01-17 MED ORDER — HEPARIN BOLUS VIA INFUSION
4600.0000 [IU] | Freq: Once | INTRAVENOUS | Status: AC
  Administered 2020-01-17: 4600 [IU] via INTRAVENOUS
  Filled 2020-01-17: qty 4600

## 2020-01-17 MED ORDER — METOPROLOL TARTRATE 5 MG/5ML IV SOLN
INTRAVENOUS | Status: AC
  Filled 2020-01-17: qty 5

## 2020-01-17 MED ORDER — HEPARIN (PORCINE) 25000 UT/250ML-% IV SOLN
1400.0000 [IU]/h | INTRAVENOUS | Status: DC
  Administered 2020-01-17 – 2020-01-18 (×2): 1400 [IU]/h via INTRAVENOUS
  Filled 2020-01-17 (×2): qty 250

## 2020-01-17 MED ORDER — FUROSEMIDE 10 MG/ML IJ SOLN
60.0000 mg | Freq: Once | INTRAMUSCULAR | Status: AC
  Administered 2020-01-17: 60 mg via INTRAVENOUS
  Filled 2020-01-17: qty 6

## 2020-01-17 MED ORDER — VALGANCICLOVIR HCL 450 MG PO TABS: 450.0000 mg | ORAL_TABLET | ORAL | Status: DC

## 2020-01-17 MED ORDER — AMIODARONE HCL IN DEXTROSE 360-4.14 MG/200ML-% IV SOLN
60.0000 mg/h | INTRAVENOUS | Status: DC
  Administered 2020-01-17 (×2): 60 mg/h via INTRAVENOUS
  Filled 2020-01-17: qty 200

## 2020-01-17 MED ORDER — INSULIN DETEMIR 100 UNIT/ML ~~LOC~~ SOLN: 10.0000 [IU] | Freq: Two times a day (BID) | SUBCUTANEOUS | 11 refills | Status: DC

## 2020-01-17 MED ORDER — AMIODARONE HCL IN DEXTROSE 360-4.14 MG/200ML-% IV SOLN
INTRAVENOUS | Status: AC
  Filled 2020-01-17: qty 200

## 2020-01-17 MED ORDER — AMIODARONE HCL IN DEXTROSE 360-4.14 MG/200ML-% IV SOLN
30.0000 mg/h | INTRAVENOUS | Status: DC
  Administered 2020-01-18: 30 mg/h via INTRAVENOUS
  Filled 2020-01-17: qty 200

## 2020-01-17 NOTE — Progress Notes (Signed)
Inpatient Diabetes Program Recommendations  AACE/ADA: New Consensus Statement on Inpatient Glycemic Control (2015)  Target Ranges:  Prepandial:   less than 140 mg/dL      Peak postprandial:   less than 180 mg/dL (1-2 hours)      Critically ill patients:  140 - 180 mg/dL   Lab Results  Component Value Date   GLUCAP 247 (H) 01/17/2020   HGBA1C 10.3 (H) 01/14/2020    Review of Glycemic Control Results for MALE, MINISH (MRN [id number]) as of 01/17/2020 13:41  Ref. Range 01/16/2020 12:05 01/16/2020 16:53 01/16/2020 20:58 01/17/2020 07:52 01/17/2020 11:28  Glucose-Capillary Latest Ref Range: 70 - 99 mg/dL 499 (H) 438 (H) 360 (H) 206 (H) 247 (H)  Home DM Meds: NPH 6 units QHS                             Novolog 6 units tid with meals   Current Orders: Levemir 8 units BID                             Novolog 0-9 units tid                             Novolog 6 units MC  Inpatient Diabetes Program Recommendations:    Consider further increase of Levemir to 10 units bid.   Thanks  Adah Perl, RN, BC-ADM Inpatient Diabetes Coordinator Pager [phone] (8a-5p)

## 2020-01-17 NOTE — Progress Notes (Addendum)
Notified by CCMD that patient is in [place]. Upon assessment, patient states that he feels some pressure in his chest. HR up into the 170's, irregular. Vitals stable, elevated BP. Dr. Leslye Peer paged, arrived at bedside. Charge RN, ICU rapid response RN, RT at bedside. Consulted cardiology. EKG obtained. New order for IV metoprolol, amiodarone bolus, amiodarone drip started.

## 2020-01-17 NOTE — Progress Notes (Signed)
Patient ID: Caleb [name], male   DOB: May 08, 1946, 74 y.o.   MRN: [id number] [facility] PROGRESS NOTE  Caleb [name] WER:[id number] DOB: [birth year] DOA: 01/13/2020 PCP: Caleb Guise, MD  HPI/Subjective: Patient feels more swollen now.  Breathing better.  Coughing up some yellow phlegm with blood-tinged.  Objective: Vitals:   01/17/20 1146 01/17/20 1516  BP:  139/69  Pulse:  88  Resp:  18  Temp:  97.7 F (36.5 C)  SpO2: 97% 100%    Intake/Output Summary (Last 24 hours) at 01/17/2020 1535 Last data filed at 01/17/2020 1500 Gross per 24 hour  Intake 533.02 ml  Output 1650 ml  Net -1116.98 ml   Filed Weights   01/14/20 1200  Weight: 103.4 kg    ROS: Review of Systems  Respiratory: Positive for cough and shortness of breath.   Cardiovascular: Negative for chest pain.  Gastrointestinal: Negative for abdominal pain and vomiting.   Exam: Physical Exam HENT:     Nose: No mucosal edema.     Mouth/Throat:     Pharynx: No oropharyngeal exudate.  Eyes:     General: Lids are normal.     Conjunctiva/sclera: Conjunctivae normal.     Pupils: Pupils are equal, round, and reactive to light.  Cardiovascular:     Rate and Rhythm: Normal rate and regular rhythm.     Heart sounds: Normal heart sounds, S1 normal and S2 normal.  Pulmonary:     Breath sounds: Examination of the left-middle field reveals decreased breath sounds. Examination of the right-lower field reveals decreased breath sounds. Examination of the left-lower field reveals decreased breath sounds. Decreased breath sounds present. No wheezing, rhonchi or rales.  Abdominal:     Palpations: Abdomen is soft.     Tenderness: There is no abdominal tenderness.  Musculoskeletal:     Right lower leg: Swelling present.     Left lower leg: Swelling present.  Skin:    General: Skin is warm.     Findings: No rash.  Neurological:     Mental Status: He is alert and oriented to person, place, and time.        Data Reviewed: Basic Metabolic Panel: Recent Labs  Lab 01/13/20 2012 01/14/20 0509 01/14/20 2117 01/16/20 0448 01/17/20 0751  NA 132* 134* 132* 135 136  K 2.9* 3.6 4.3 4.3 4.2  CL 96* 97* 97* 100 104  CO2 23 27 23  21* 20*  GLUCOSE 274* 164* 261* 359* 212*  BUN 45* 44* 50* 86* 99*  CREATININE 2.56* 2.53* 3.01* 3.70* 4.05*  CALCIUM 8.1* 8.2* 8.1* 8.4* 8.3*  MG  --   --  2.1  --   --    Liver Function Tests: Recent Labs  Lab 01/14/20 2117  AST 26  ALT 17  ALKPHOS 58  BILITOT 1.2  PROT 6.2*  ALBUMIN 3.3*   CBC: Recent Labs  Lab 01/13/20 2012 01/14/20 2117 01/16/20 0448  WBC 10.8* 13.5* 14.0*  NEUTROABS  --  12.3*  --   HGB 10.1* 11.1* 9.4*  HCT 31.9* 33.6* 29.2*  MCV 82.9 81.0 82.7  PLT 143* 139* 133*   BNP (last 3 results) Recent Labs    01/14/20 1823  BNP 990.4*    CBG: Recent Labs  Lab 01/16/20 1205 01/16/20 1653 01/16/20 2058 01/17/20 0752 01/17/20 1128  GLUCAP 499* 438* 360* 206* 247*    Recent Results (from the past 240 hour(s))  SARS Coronavirus 2 by RT PCR (hospital order, performed in [facility]  hospital lab) Nasopharyngeal Nasopharyngeal Swab     Status: None   Collection Time: 01/13/20  8:12 PM   Specimen: Nasopharyngeal Swab  Result Value Ref Range Status   SARS Coronavirus 2 NEGATIVE NEGATIVE Final    Comment: (NOTE) SARS-CoV-2 target nucleic acids are NOT DETECTED.  The SARS-CoV-2 RNA is generally detectable in upper and lower respiratory specimens during the acute phase of infection. The lowest concentration of SARS-CoV-2 viral copies this assay can detect is 250 copies / mL. A negative result does not preclude SARS-CoV-2 infection and should not be used as the sole basis for treatment or other patient management decisions.  A negative result may occur with improper specimen collection / handling, submission of specimen other than nasopharyngeal swab, presence of viral mutation(s) within the areas targeted by this  assay, and inadequate number of viral copies (<250 copies / mL). A negative result must be combined with clinical observations, patient history, and epidemiological information.  Fact Sheet for Patients:   StrictlyIdeas.no  Fact Sheet for Healthcare Providers: BankingDealers.co.za  This test is not yet approved or  cleared by the [place] FDA and has been authorized for detection and/or diagnosis of SARS-CoV-2 by FDA under an Emergency Use Authorization (EUA).  This EUA will remain in effect (meaning this test can be used) for the duration of the COVID-19 declaration under Section 564(b)(1) of the Act, 21 U.S.C. section 360bbb-3(b)(1), unless the authorization is terminated or revoked sooner.  Performed at [facility], [address] [place], [place] [address]   MRSA PCR Screening     Status: None   Collection Time: 01/15/20  8:15 AM   Specimen: Nasal Mucosa; Nasopharyngeal  Result Value Ref Range Status   MRSA by PCR NEGATIVE NEGATIVE Final    Comment:        The GeneXpert MRSA Assay (FDA approved for NASAL specimens only), is one component of a comprehensive MRSA colonization surveillance program. It is not intended to diagnose MRSA infection nor to guide or monitor treatment for MRSA infections. Performed at [facility], [address] [place], [place] [address]      Scheduled Meds: . Chlorhexidine Gluconate Cloth  6 each Topical Daily  . cholecalciferol  1,000 Units Oral Daily  . insulin aspart  0-9 Units Subcutaneous TID WC  . insulin aspart  6 Units Subcutaneous TID WC  . insulin detemir  10 Units Subcutaneous BID AC & HS  . ipratropium-albuterol  3 mL Nebulization QID  . loratadine  10 mg Oral Daily  . methylPREDNISolone (SOLU-MEDROL) injection  40 mg Intravenous Q12H  . metoprolol tartrate  25 mg Oral BID  . mometasone-formoterol  2 puff Inhalation BID  . multivitamin with minerals  1  tablet Oral Daily  . pantoprazole  40 mg Oral BID  . pravastatin  20 mg Oral q1800  . Sirolimus  1 mg Oral Daily  . sulfamethoxazole-trimethoprim  1 tablet Oral Once per day on Mon Wed Fri  . tacrolimus  1 mg Oral BID  . tamsulosin  0.4 mg Oral QPC breakfast  . [START ON 01/20/2020] valGANciclovir  450 mg Oral Once per day on Mon Thu   Continuous Infusions: . ceFEPime (MAXIPIME) IV Stopped (01/16/20 2300)    Assessment/Plan:  1. Acute hypoxic respiratory failure.  Patient uses BiPAP only at night at this point on oxygen during the day.  Continue 4 L of oxygen. 2. COPD exacerbation with multifocal pneumonia.  Continue IV Solu-Medrol and Maxipime.  MRSA PCR  was negative so vancomycin was discontinued. 3. Right hip fracture.  Patient will be transferred over to [facility] for surgical procedure 4. History of right lung transplant.  Continue immunosuppressive's. 5. Acute kidney injury on chronic kidney disease stage IIIb.  Creatinine elevated to 4 today.  Patient feels he is more swollen so I will get rid of IV fluids and give a dose of Lasix 60 IV once.  Continue to watch creatinine closely.  Patient has a Foley catheter in.  Likely will also need nephrology consultation.  Asked pharmacist to look over medications and adjust per renal function. 6. Type 2 diabetes mellitus with hyperlipidemia on pravastatin.  Continue Levemir insulin increased to 10 units twice a day while on steroids.  Sliding scale also. 7. Anemia unspecified continue to watch hemoglobin. 8. BPH on Flomax 9. Chronic diastolic congestive heart failure.  Patient feels more swollen today I will give a dose of IV Lasix 60 mg IV once today and reassess tomorrow.   Hopefully the patient be transferred over to [place] today     Code Status:     Code Status Orders  (From admission, onward)         Start     Ordered   01/13/20 2241  Full code  Continuous        01/13/20 2242        Code Status History    This patient has a  current code status but no historical code status.   Advance Care Planning Activity     Family Communication: Wife at the bedside Disposition Plan: Status is: Inpatient  Dispo: The patient is from: Home              Anticipated d/c is to: Transfer to [facility]              Anticipated d/c date is: Still waiting for bed at [facility]              Patient currently stable for transfer over to [facility] for surgical procedure.  Time spent: 28 minutes  [place]

## 2020-01-17 NOTE — Consult Note (Signed)
Cardiology Consultation Note    Patient ID: Caleb [name], MRN: [id number], DOB/AGE: [birth year] 74 y.o. Admit date: 01/13/2020   Date of Consult: 01/17/2020 Primary Physician: Lavera Guise, MD Primary Cardiologist:  [facility]  Chief Complaint: right hip pain Reason for Consultation: wide complex tachycardia Requesting MD: Dr. Leslye Peer  HPI: Caleb [name] is a 74 y.o. male with history of pulmonary fibrosis s/p right lung trpt, history of syncope with ppm in place followed at [facility]. Called tonight with report of wide complex tachycardia. Pt tolerated hemodynamically. Was given iv metoprolol without improvement followed by iv bolus followed by drip of amiodarone. Rate slowed showing afib with rvr. Hemodynamically stable.   Past Medical History:  Diagnosis Date  . BPH (benign prostatic hyperplasia)   . Chest pain, unspecified   . Diabetes mellitus without complication ([place])   . Dyspnea   . Hyperlipidemia   . Hypertension       Surgical History:  Past Surgical History:  Procedure Laterality Date  . LUNG TRANSPLANT       Home Meds: Prior to Admission medications   Medication Sig Start Date End Date Taking? Authorizing Provider  albuterol (VENTOLIN HFA) 108 (90 Base) MCG/ACT inhaler Inhale into the lungs. 04/11/18 01/13/20 Yes [provider]  azithromycin (ZITHROMAX) 250 MG tablet Take by mouth daily.   Yes [provider]  budesonide-formoterol (SYMBICORT) 160-4.5 MCG/ACT inhaler Inhale 2 puffs into the lungs 2 (two) times daily.  06/05/18 01/13/20 Yes [provider]  dapagliflozin propanediol (FARXIGA) 10 MG TABS tablet Take 1 tablet by mouth daily.  08/20/19 08/19/20 Yes [provider]  ENTRESTO 24-26 MG Take 1 tablet by mouth every 12 (twelve) hours. 12/31/19  Yes [provider]  insulin NPH Human (NOVOLIN N) 100 UNIT/ML injection Inject 6 Units into the skin every evening. 12/16/19 12/15/20 Yes [provider]  insulin regular  (NOVOLIN R) 100 units/mL injection Inject 6 Units into the skin with breakfast, with lunch, and with evening meal. 12/16/19  Yes [provider]  metoprolol succinate (TOPROL-XL) 50 MG 24 hr tablet Take 50 mg by mouth daily.  09/17/19  Yes [provider]  Multiple Vitamin (MULTI-VITAMIN) tablet Take 1 tablet by mouth daily. 08/19/19  Yes [provider]  pantoprazole (PROTONIX) 40 MG tablet Take 40 mg by mouth 2 (two) times daily.    Yes [provider]  predniSONE (DELTASONE) 5 MG tablet Take 5 mg by mouth daily with breakfast.  07/01/17  Yes [provider]  rosuvastatin (CRESTOR) 20 MG tablet Take 20 mg by mouth at bedtime. 11/15/19  Yes [provider]  Sirolimus (RAPAMUNE) 0.5 MG tablet Take 1 mg by mouth daily.  12/24/19  Yes [provider]  sulfamethoxazole-trimethoprim (BACTRIM) 400-80 MG tablet Take 1 tablet by mouth 3 (three) times a week. 08/19/19 08/18/20 Yes [provider]  tacrolimus (PROGRAF) 1 MG capsule Take 1 mg by mouth 2 (two) times daily.  02/01/18  Yes [provider]  tamsulosin (FLOMAX) 0.4 MG CAPS Take 0.4 mg by mouth daily after breakfast. 1 on saturdays   Yes [provider]  torsemide (DEMADEX) 20 MG tablet Take 40 mg by mouth 2 (two) times daily. 01/09/20  Yes [provider]  valGANciclovir (VALCYTE) 450 MG tablet Take 450 mg by mouth every other day.   Yes [provider]  acetaminophen (TYLENOL) 325 MG tablet Take 650 mg by mouth every 6 (six) hours as needed.    [provider]  albuterol (PROVENTIL) (2.5 MG/3ML) 0.083% nebulizer solution Inhale into the lungs. 06/06/18   [provider]  ceFEPIme 2 g in sodium chloride 0.9 % 100 mL Inject 2 g into the vein daily. 01/16/20   Loletha Grayer, MD  fexofenadine (ALLEGRA) 180 MG tablet Take 180 mg by mouth daily.    [provider]  glucose blood (ONETOUCH VERIO) test strip TEST BLOOD SUGARS TWICE  DAILY. DX E11.65. 11/27/19   Kendell Bane, NP  insulin aspart (NOVOLOG) 100 UNIT/ML injection Inject 4 Units into the skin 3 (three) times daily with meals. 01/16/20   Loletha Grayer, MD  insulin aspart (NOVOLOG) 100 UNIT/ML injection Inject 6 Units into the skin 3 (three) times daily with meals. 01/16/20   Loletha Grayer, MD  insulin detemir (LEVEMIR) 100 UNIT/ML injection Inject 0.06 mLs (6 Units total) into the skin 2 (two) times daily at 8 am and 10 pm. 01/16/20   Loletha Grayer, MD  insulin detemir (LEVEMIR) 100 UNIT/ML injection Inject 0.08 mLs (8 Units total) into the skin 2 (two) times daily at 8 am and 10 pm. 01/16/20   Loletha Grayer, MD  insulin detemir (LEVEMIR) 100 UNIT/ML injection Inject 0.1 mLs (10 Units total) into the skin 2 (two) times daily at 8 am and 10 pm. 01/17/20   Leslye Peer, Richard, MD  ipratropium-albuterol (DUONEB) 0.5-2.5 (3) MG/3ML SOLN Take 3 mLs by nebulization 4 (four) times daily. 01/16/20   Loletha Grayer, MD  Lancets 28G MISC Use as instructed QID 08/05/14   [provider]  lovastatin (MEVACOR) 20 MG tablet Take 1 tablet (20 mg total) by mouth daily. 01/16/20   Loletha Grayer, MD  melatonin (MELATONIN MAXIMUM STRENGTH) 5 MG TABS Take 1 tablet (5 mg total) by mouth at bedtime as needed (sleep). 01/16/20   Loletha Grayer, MD  methylPREDNISolone sodium succinate (SOLU-MEDROL) 40 mg/mL injection Inject 1 mL (40 mg total) into the vein every 12 (twelve) hours. 01/16/20   Loletha Grayer, MD  metoprolol tartrate (LOPRESSOR) 25 MG tablet Take 1 tablet (25 mg total) by mouth 2 (two) times daily. 01/16/20   Loletha Grayer, MD  valGANciclovir (VALCYTE) 450 MG tablet Take 1 tablet (450 mg total) by mouth 2 (two) times a week. 01/20/20   Loletha Grayer, MD    Inpatient Medications:  . Chlorhexidine Gluconate Cloth  6 each Topical Daily  . cholecalciferol  1,000 Units Oral Daily  . insulin aspart  0-9 Units Subcutaneous TID WC  . insulin aspart  6 Units  Subcutaneous TID WC  . insulin detemir  10 Units Subcutaneous BID AC & HS  . ipratropium-albuterol  3 mL Nebulization QID  . loratadine  10 mg Oral Daily  . methylPREDNISolone (SOLU-MEDROL) injection  40 mg Intravenous Q12H  . metoprolol tartrate  25 mg Oral BID  . mometasone-formoterol  2 puff Inhalation BID  . multivitamin with minerals  1 tablet Oral Daily  . pantoprazole  40 mg Oral BID  . pravastatin  20 mg Oral q1800  . Sirolimus  1 mg Oral Daily  . sulfamethoxazole-trimethoprim  1 tablet Oral Once per day on Mon Wed Fri  . tacrolimus  1 mg Oral BID  . tamsulosin  0.4 mg Oral QPC breakfast  . [START ON 01/20/2020] valGANciclovir  450 mg Oral Once per day on Mon Thu   . amiodarone 60 mg/hr (01/17/20 1653)   Followed by  . amiodarone    . ceFEPime (MAXIPIME) IV Stopped (01/16/20 2300)    Allergies:  Allergies  Allergen Reactions  . 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    Social History   Socioeconomic History  . Marital status: Married    Spouse name: Not on file  . Number of children: Not on file  . Years of education: Not on file  . Highest education level: Not on file  Occupational History  . Not on file  Tobacco Use  . Smoking status: Former Smoker    Types: Cigarettes    Quit date: 05/16/1979    Years since quitting: 40.7  . Smokeless tobacco: Never Used  . Tobacco comment: Quit 1980  Vaping Use  . Vaping Use: Never used  Substance and Sexual Activity  . Alcohol use: Not Currently  . Drug use: No  . Sexual activity: Not on file  Other Topics Concern  . 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    Social Determinants of Health   Financial Resource Strain:   . Difficulty of Paying Living Expenses: Not on file  Food Insecurity:   . Worried About Charity fundraiser in the Last Year: Not on file  . Ran Out of Food in  the Last Year: Not on file  Transportation Needs:   . Lack of Transportation (Medical): Not on file  . Lack of Transportation (Non-Medical): Not on file  Physical Activity:   . Days of Exercise per Week: Not on file  . Minutes of Exercise per Session: Not on file  Stress:   . Feeling of Stress : Not on file  Social Connections:   . Frequency of Communication with Friends and Family: Not on file  . Frequency of Social Gatherings with Friends and Family: Not on file  . Attends Religious Services: Not on file  . Active Member of Clubs or Organizations: Not on file  . Attends Archivist Meetings: Not on file  . Marital Status: Not on file  Intimate Partner Violence:   . Fear of Current or Ex-Partner: Not on file  . Emotionally Abused: Not on file  . Physically Abused: Not on file  . Sexually Abused: Not on file     Family History  Problem Relation Age of Onset  . Coronary artery disease Other   . Diabetes Other      Review of Systems: A 12-system review of systems was performed and is negative except as noted in the HPI.  Labs: No results for input(s): CKTOTAL, CKMB, TROPONINI in the last 72 hours. Lab Results  Component Value Date   WBC 14.0 (H) 01/16/2020   HGB 9.4 (L) 01/16/2020   HCT 29.2 (L) 01/16/2020   MCV 82.7 01/16/2020   PLT 133 (L) 01/16/2020    Recent Labs  Lab 01/14/20 2117 01/16/20 0448 01/17/20 0751  NA 132*   < > 136  K 4.3   < > 4.2  CL 97*   < > 104  CO2 23   < > 20*  BUN 50*   < > 99*  CREATININE 3.01*   < > 4.05*  CALCIUM 8.1*   < > 8.3*  PROT 6.2*  --   --   BILITOT 1.2  --   --   ALKPHOS 58  --   --   ALT 17  --   --   AST 26  --   --  GLUCOSE 261*   < > 212*   < > = values in this interval not displayed.   Lab Results  Component Value Date   CHOL 131 03/18/2013   HDL 38 (L) 03/18/2013   LDLCALC 74 03/18/2013   TRIG 97 03/18/2013   Lab Results  Component Value Date   DDIMER  03/18/2008    0.27        AT THE INHOUSE  ESTABLISHED CUTOFF VALUE OF 0.48 ug/mL FEU, THIS ASSAY HAS BEEN DOCUMENTED IN THE LITERATURE TO HAVE    Radiology/Studies:  DG Chest 2 View  Result Date: 01/13/2020 CLINICAL DATA:  Syncopal event with fall, former smoker, history of lung transplant EXAM: CHEST - 2 VIEW COMPARISON:  06/25/2013 chest radiograph. FINDINGS: Right internal jugular Port-A-Cath terminates over the cavoatrial junction. Three lead left subclavian ICD with lead tips overlying the right atrium, right ventricle and coronary sinus. Stable cardiomediastinal silhouette with mild cardiomegaly. No pneumothorax. No pleural effusion. Asymmetric volume loss in the left hemithorax. Patchy and reticular opacities throughout the left lung, worsened from prior. Minimal scarring at the right lung base. Otherwise clear right lung. No displaced fractures in the visualized chest. IMPRESSION: 1. Minimal scarring at the right lung base. Otherwise clear right lung. 2. 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 3. Mild cardiomegaly. Electronically Signed   By: Ilona Sorrel M.D.   On: 01/13/2020 20:18   DG Elbow Complete Right  Result Date: 01/13/2020 CLINICAL DATA:  Syncopal episode with right elbow pain EXAM: RIGHT ELBOW - COMPLETE 3+ VIEW COMPARISON:  None. FINDINGS: No fracture, joint effusion or dislocation. No suspicious focal osseous lesions. No significant degenerative arthropathy. No radiopaque foreign body. IMPRESSION: No right elbow fracture, joint effusion or malalignment. Electronically Signed   By: Ilona Sorrel M.D.   On: 01/13/2020 20:13   CT CHEST WO CONTRAST  Result Date: 01/14/2020 CLINICAL DATA:  Syncope yesterday, difficulty breathing, history of lung transplant EXAM: CT CHEST WITHOUT CONTRAST TECHNIQUE: Multidetector CT imaging of the chest was performed following the standard protocol without IV contrast. COMPARISON:  01/13/2020, 01/14/2020, 09/12/2013 FINDINGS: Cardiovascular: Multi lead pacer is  identified. There is diffuse atherosclerosis of the aorta and coronary vasculature. The heart is unremarkable without pericardial effusion. Mediastinum/Nodes: No enlarged mediastinal or axillary lymph nodes. Thyroid gland, trachea, and esophagus demonstrate no significant findings. Lungs/Pleura: Chronic fibrosis and volume loss within the left lung. Postsurgical changes from right lung transplant. There are patchy areas of ground-glass airspace disease throughout the right lung, greatest in the right lower lobe. Findings could reflect multifocal pneumonia, hemorrhage, drug reaction, or early edema. No pleural effusion or pneumothorax. Central airways are patent. Upper Abdomen: No acute abnormality. Musculoskeletal: No acute or destructive bony lesions. Reconstructed images demonstrate no additional findings. IMPRESSION: 1. Patchy ground-glass airspace disease throughout the right lung transplant. Favor multifocal pneumonia, though hemorrhage or drug reaction could also give this appearance. Edema is felt to be less likely without associated effusion or cardiomegaly. 2. Chronic scarring and fibrosis throughout the left lung, with severe left lung volume loss. 3.  Aortic Atherosclerosis (ICD10-I70.0). Electronically Signed   By: Randa Ngo M.D.   On: 01/14/2020 17:03   NM Pulmonary Perfusion  Result Date: 01/14/2020 CLINICAL DATA:  History of RIGHT lung transplant. Short of breath, concern for pulmonary embolism. EXAM: NUCLEAR MEDICINE PERFUSION LUNG SCAN TECHNIQUE: Perfusion images were obtained in multiple projections after intravenous injection of radiopharmaceutical. RADIOPHARMACEUTICALS:  4.6 mCi Tc-96m MAA COMPARISON:  Chest radiograph  09/13/2019, CT 09/12/2013 FINDINGS: There is near absent perfusion to the LEFT lung which corresponds to near complete opacification on comparison radiograph. History of chronic interstitial lung disease on the LEFT. A small peripheral perfusion defects within the RIGHT  lung. Findings are more suggestive of COPD than acute or chronic PE. IMPRESSION: 1. No convincing evidence acute or chronic PE within the RIGHT lung. 2. 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 3. Near absent perfusion to the LEFT lung corresponds to dense opacification on comparison radiograph and history of chronic LEFT lung fibrosis. Electronically Signed   By: Suzy Bouchard M.D.   On: 01/14/2020 15:13   US Carotid Bilateral  Result Date: 01/14/2020 CLINICAL DATA:  Syncope, hypertension, hyperlipidemia EXAM: BILATERAL CAROTID DUPLEX ULTRASOUND TECHNIQUE: Pearline Cables scale imaging, color Doppler and duplex ultrasound were performed of bilateral carotid and vertebral arteries in the neck. COMPARISON:  None. FINDINGS: Criteria: Quantification of carotid stenosis is based on velocity parameters that correlate the residual internal carotid diameter with NASCET-based stenosis levels, using the diameter of the distal internal carotid lumen as the denominator for stenosis measurement. The following velocity measurements were obtained: RIGHT ICA: 107/41 cm/sec CCA: 69/67 cm/sec SYSTOLIC ICA/CCA RATIO:  1.3 ECA: 79 cm/sec LEFT ICA: 83/28 cm/sec CCA: 89/38 cm/sec SYSTOLIC ICA/CCA RATIO:  1.0 ECA: 85 cm/sec RIGHT CAROTID ARTERY: Minor intimal thickening and plaque formation. No hemodynamically significant right ICA stenosis, velocity elevation, or turbulent flow. Degree of narrowing less than 50%. RIGHT VERTEBRAL ARTERY:  Normal antegrade flow LEFT CAROTID ARTERY: Similar minor intimal thickening and plaque formation. No hemodynamically significant left ICA stenosis, velocity elevation, or turbulent flow. LEFT VERTEBRAL ARTERY:  Normal antegrade flow IMPRESSION: Minor carotid atherosclerosis. No hemodynamically significant ICA stenosis. Degree of narrowing less than 50% bilaterally by ultrasound criteria. Patent antegrade vertebral flow bilaterally  Electronically Signed   By: Jerilynn Mages.  Shick M.D.   On: 01/14/2020 11:05   DG Chest Port 1 View  Result Date: 01/14/2020 CLINICAL DATA:  74 year old male with history of acute respiratory distress. EXAM: PORTABLE CHEST 1 VIEW COMPARISON:  Chest x-ray 01/13/2020. FINDINGS: Extensive areas of interstitial prominence and patchy airspace opacities are noted in the lungs bilaterally (left greater than right). Near complete opacification of the left hemithorax which corresponds to extensive fibrotic changes noted on recent chest CT. No pleural effusions. No evidence of pulmonary edema. No pneumothorax. Heart size is normal. Mediastinal contours are distorted in shifted into the left hemithorax related to patient's chronic left-sided volume loss. Aortic atherosclerosis. Right internal jugular single-lumen porta cath with tip terminating at the superior cavoatrial junction. Left-sided biventricular pacemaker/AICD with lead tips projecting over the expected location of the right atrium, right ventricle and overlying the lateral wall the left ventricle via the coronary sinus and coronary veins. IMPRESSION: 1. Chronic lung changes related to underlying pulmonary fibrosis and likely superimposed acute bronchopneumonia, as above, with slightly worsened aeration in the right lung when compared to the recent prior examination. Electronically Signed   By: Vinnie Langton M.D.   On: 01/14/2020 20:17   ECHOCARDIOGRAM COMPLETE  Result Date: 01/14/2020    ECHOCARDIOGRAM REPORT   Patient Name:   PRABHAV FAULKENBERRY Date of Exam: 01/14/2020 Medical Rec #:  [id number]          Height:       69.0 in Accession #:    [id number]         Weight:       228.0  lb Date of Birth:  19-Jun-1945          BSA:          2.184 m Patient Age:    50 years           BP:           Not listed in chart/Not listed                                                  in chart mmHg Patient Gender: M                  HR:           Not listed in chart bpm. Exam Location:   [facility] Procedure: 2D Echo, Cardiac Doppler and Color Doppler Indications:     Syncope 780.2  History:         Patient has no prior history of Echocardiogram examinations.                  Signs/Symptoms:Chest Pain; Risk Factors:Hypertension,                  Dyslipidemia and Diabetes.  Sonographer:     Sherrie Sport RDCS (AE) Referring Phys:  [id number] Arvella Merles MANSY Diagnosing Phys: Harrell Gave End MD  Sonographer Comments: Technically difficult study due to poor echo windows, suboptimal parasternal window, no apical window and no subcostal window. IMPRESSIONS  1. Left ventricular ejection fraction, by estimation, is >55%. The left ventricle has normal function. Left ventricular endocardial border not optimally defined to evaluate regional wall motion. There is moderate to severe asymmetric left ventricular hypertrophy. Left ventricular diastolic function could not be evaluated.  2. Right ventricular systolic function is low normal. The right ventricular size is not well visualized. There is moderately elevated pulmonary artery systolic pressure.  3. The mitral valve is degenerative. Unable to accurately assess mitral valve regurgitation.  4. Tricuspid valve regurgitation is mild to moderate.  5. The aortic valve is tricuspid. Aortic valve regurgitation not well assessed.  6. Moderately dilated pulmonary artery. FINDINGS  Left Ventricle: Left ventricular ejection fraction, by estimation, is >55%. The left ventricle has normal function. Left ventricular endocardial border not optimally defined to evaluate regional wall motion. The left ventricular internal cavity size was  normal in size. There is moderate to severe asymmetric left ventricular hypertrophy. Left ventricular diastolic function could not be evaluated. Right Ventricle: Pulmonary artery systolic pressure is 79-02 mmHg plus central venous pressure. The right ventricular size is not well visualized. No increase in right ventricular wall thickness. Right ventricular  systolic function is low normal. There is moderately elevated pulmonary artery systolic pressure. Left Atrium: Left atrial size was not well visualized. Right Atrium: Right atrial size was not well visualized. Pericardium: The pericardium was not well visualized. Mitral Valve: The mitral valve is degenerative in appearance. There is mild thickening of the mitral valve leaflet(s). Unable to accurately assess mitral valve regurgitation. Tricuspid Valve: The tricuspid valve is normal in structure. Tricuspid valve regurgitation is mild to moderate. Aortic Valve: The aortic valve is tricuspid. Aortic valve regurgitation not well assessed. Pulmonic Valve: The pulmonic valve was grossly normal. Pulmonic valve regurgitation is mild. No evidence of pulmonic stenosis. Aorta: The aortic root is normal in size and structure. Pulmonary Artery: The pulmonary artery is moderately dilated.  Venous: The inferior vena cava was not well visualized. IAS/Shunts: The interatrial septum was not well visualized. Additional Comments: A pacer wire is visualized in the right ventricle.  LEFT VENTRICLE PLAX 2D LVIDd:         4.26 cm LVIDs:         2.70 cm LV PW:         1.34 cm LV IVS:        1.72 cm LVOT diam:     2.00 cm LVOT Area:     3.14 cm  LEFT ATRIUM         Index LA diam:    4.90 cm 2.24 cm/m                        PULMONIC VALVE AORTA                 PV Vmax:        0.76 m/s Ao Root diam: 3.20 cm PV Peak grad:   2.3 mmHg                       RVOT Peak grad: 4 mmHg  TRICUSPID VALVE TR Peak grad:   51.3 mmHg TR Vmax:        358.00 cm/s  SHUNTS Systemic Diam: 2.00 cm Nelva Bush MD Electronically signed by Nelva Bush MD Signature Date/Time: 01/14/2020/3:18:50 PM    Final    DG Hip Unilat W or Wo Pelvis 2-3 Views Right  Result Date: 01/13/2020 CLINICAL DATA:  74 year old male with fall and right hip pain. EXAM: DG HIP (WITH OR WITHOUT PELVIS) 2-3V RIGHT COMPARISON:  CT of the abdomen pelvis dated 06/17/2013. FINDINGS:  Minimally displaced fracture of the right femoral neck. There is no dislocation. The bones are osteopenic. The soft tissues are unremarkable. IMPRESSION: Minimally displaced fracture of the right femoral neck. Electronically Signed   By: Anner Crete M.D.   On: 01/13/2020 20:14    Wt Readings from Last 3 Encounters:  01/14/20 103.4 kg  11/27/19 89.6 kg  07/18/19 87.1 kg    EKG: probable afib with rvr with aberrant conduction  Physical Exam:no acute distress Blood pressure (!) 152/111, pulse (!) 105, temperature 97.7 F (36.5 C), temperature source Oral, resp. rate 18, height 5\' 9"  (1.753 m), weight 103.4 kg, SpO2 100 %. Body mass index is 33.67 kg/m. General: Well developed, well nourished, in no acute distress. Head: Normocephalic, atraumatic, sclera non-icteric, no xanthomas, nares are without discharge.  Neck: Negative for carotid bruits. JVD not elevated. Lungs: course rhonchi bilaterally Heart: irr, irr  Abdomen: Soft, non-tender, non-distended with normoactive bowel sounds. No hepatomegaly. No rebound/guarding. No obvious abdominal masses. Msk:  Strength and tone appear normal for age. Extremities: No clubbing or cyanosis. No edema.  Distal pedal pulses are 2+ and equal bilaterally. Neuro: Alert and oriented X 3. No facial asymmetry. No focal deficit. Moves all extremities spontaneously. Psych:  Responds to questions appropriately with a normal affect.     Assessment and Plan  Wide complex tachycardia-appears to be afib with rvr and aberrant conduction. Rate has improved with iv amiodarone bolus and drip. Placing magnet on his pacemaker did not break rhythm. Will continue with amiodarone drip. Will add iv heparin for anticoagulation. Not candidate for NOAC due to pending orthopedic surgery Will try to limit long term use of amiodarone given lung transplant however data reported acceptable use in this population, at least short term.  Lung disease-s/p lung trpt. Being treated  for acute respiratory failure. On cpap at night and on 4 liters oxygen per nasal canula.   Right hip fx-awaiting transfer to [facility] for surgery  DMp on insulin and ravastatin    Signed, Teodoro Spray MD 01/17/2020, 5:36 PM Pager: [phone]

## 2020-01-17 NOTE — Progress Notes (Addendum)
Patient ID: Caleb [name], male   DOB: Mar 18, 1946, 74 y.o.   MRN: [id number]  Called to see patient for V-tach.  Patient with good sbp and fast HR 150-160. Patient mentating.  Cardiology consulted.  Metoprolol 5mg  iv pushed with hr coming down into the 130's, now look more like atrial fibrillation.  Amiodarone bolus and drip started.  Hr trending 120-140 now.  No chest pain, some shortness of breath, positve for palpitations.  Dr Loletha Grayer  Tried to call [facility] for update but on hold for a period of time Tried to call patient wife but mailbox full.

## 2020-01-17 NOTE — Progress Notes (Signed)
ANTICOAGULATION CONSULT NOTE - Initial Consult  Pharmacy Consult for  Heparin drip Indication: Atrial Fibrillation  Allergies  Allergen Reactions  . 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    Patient Measurements: Height: 5\' 9"  (175.3 cm) Weight:  (low bed. no lift. nurse notified.) IBW/kg (Calculated) : 70.7 Heparin Dosing Weight: 92.9 kg  Vital Signs: Temp: 97.8 F (36.6 C) (08/20 1950) Temp Source: Oral (08/20 1950) BP: 109/67 (08/20 2000) Pulse Rate: 110 (08/20 2000)  Labs: Recent Labs    01/14/20 2117 01/16/20 0448 01/17/20 0751 01/17/20 1820  HGB 11.1* 9.4*  --   --   HCT 33.6* 29.2*  --   --   PLT 139* 133*  --   --   APTT  --   --   --  26  LABPROT  --   --   --  14.1  INR  --   --   --  1.1  CREATININE 3.01* 3.70* 4.05*  --     Estimated Creatinine Clearance: 19.8 mL/min (A) (by C-G formula based on SCr of 4.05 mg/dL (H)).   Medical History: Past Medical History:  Diagnosis Date  . BPH (benign prostatic hyperplasia)   . Chest pain, unspecified   . Diabetes mellitus without complication ([place])   . Dyspnea   . Hyperlipidemia   . Hypertension     Medications:  Scheduled:  . Chlorhexidine Gluconate Cloth  6 each Topical Daily  . cholecalciferol  1,000 Units Oral Daily  . insulin aspart  0-9 Units Subcutaneous TID WC  . insulin aspart  6 Units Subcutaneous TID WC  . insulin detemir  10 Units Subcutaneous BID AC & HS  . ipratropium-albuterol  3 mL Nebulization QID  . loratadine  10 mg Oral Daily  . methylPREDNISolone (SOLU-MEDROL) injection  40 mg Intravenous Q12H  . metoprolol tartrate  25 mg Oral BID  . mometasone-formoterol  2 puff Inhalation BID  . multivitamin with minerals  1 tablet Oral Daily  . pantoprazole  40 mg Oral BID  . pravastatin  20 mg Oral q1800  . Sirolimus  1 mg Oral Daily  . sulfamethoxazole-trimethoprim  1 tablet Oral Once per day on Mon Wed  Fri  . tacrolimus  1 mg Oral BID  . tamsulosin  0.4 mg Oral QPC breakfast  . [START ON 01/20/2020] valGANciclovir  450 mg Oral Once per day on Mon Thu   Infusions:  . amiodarone 60 mg/hr (01/17/20 1755)   Followed by  . amiodarone    . ceFEPime (MAXIPIME) IV 2 g (01/17/20 2017)  . heparin 1,400 Units/hr (01/17/20 1846)    Assessment: 74 yo male to start Heparin drip for Afib Hgb 11.1  plt 139  INR 1.1  APTT 26 S/p lung transplant, pacemaker.  Not candidate for NOAC due to pending orthopedic surgery. Right hip fx-awaiting transfer to [facility] for surgery  Goal of Therapy:  Heparin level 0.3-0.7 units/ml Monitor platelets by anticoagulation protocol: Yes   Plan:  Give 4600 units bolus x 1 Start heparin infusion at 1400 units/hr Check anti-Xa level in 8 hours and daily while on heparin Continue to monitor H&H and platelets  Adir Schicker A 01/17/2020,8:34 PM

## 2020-01-17 NOTE — Progress Notes (Signed)
[facility]) called for update.  Anticipate transfer on Saturday.

## 2020-01-18 DIAGNOSIS — I4891 Unspecified atrial fibrillation: Secondary | ICD-10-CM

## 2020-01-18 LAB — BASIC METABOLIC PANEL
Anion gap: 16 — ABNORMAL HIGH (ref 5–15)
BUN: 111 mg/dL — ABNORMAL HIGH (ref 8–23)
CO2: 19 mmol/L — ABNORMAL LOW (ref 22–32)
Calcium: 8.6 mg/dL — ABNORMAL LOW (ref 8.9–10.3)
Chloride: 99 mmol/L (ref 98–111)
Creatinine, Ser: 5.06 mg/dL — ABNORMAL HIGH (ref 0.61–1.24)
GFR calc Af Amer: 12 mL/min — ABNORMAL LOW (ref >60)
GFR calc non Af Amer: 11 mL/min — ABNORMAL LOW (ref >60)
Glucose, Bld: 327 mg/dL — ABNORMAL HIGH (ref 70–99)
Potassium: 5 mmol/L (ref 3.5–5.1)
Sodium: 134 mmol/L — ABNORMAL LOW (ref 135–145)

## 2020-01-18 LAB — GLUCOSE, CAPILLARY
Glucose-Capillary: 247 mg/dL — ABNORMAL HIGH (ref 70–99)
Glucose-Capillary: 287 mg/dL — ABNORMAL HIGH (ref 70–99)
Glucose-Capillary: 340 mg/dL — ABNORMAL HIGH (ref 70–99)
Glucose-Capillary: 338 mg/dL — ABNORMAL HIGH (ref 70–99)

## 2020-01-18 LAB — CBC
HCT: 30.7 % — ABNORMAL LOW (ref 39.0–52.0)
Hemoglobin: 9.7 g/dL — ABNORMAL LOW (ref 13.0–17.0)
MCH: 26.1 pg (ref 26.0–34.0)
MCHC: 31.6 g/dL (ref 30.0–36.0)
MCV: 82.7 fL (ref 80.0–100.0)
Platelets: 163 10*3/uL (ref 150–400)
RBC: 3.71 MIL/uL — ABNORMAL LOW (ref 4.22–5.81)
RDW: 15.7 % — ABNORMAL HIGH (ref 11.5–15.5)
WBC: 14.4 10*3/uL — ABNORMAL HIGH (ref 4.0–10.5)
nRBC: 0.1 % (ref 0.0–0.2)

## 2020-01-18 LAB — HEPARIN LEVEL (UNFRACTIONATED)
Heparin Unfractionated: 1.56 IU/mL — ABNORMAL HIGH (ref 0.30–0.70)
Heparin Unfractionated: 1.16 IU/mL — ABNORMAL HIGH (ref 0.30–0.70)

## 2020-01-18 MED ORDER — AMIODARONE HCL 200 MG PO TABS
400.0000 mg | ORAL_TABLET | Freq: Two times a day (BID) | ORAL | Status: DC
  Administered 2020-01-18 (×2): 400 mg via ORAL
  Filled 2020-01-18 (×2): qty 2

## 2020-01-18 MED ORDER — METHYLPREDNISOLONE SODIUM SUCC 40 MG IJ SOLR: 40.0000 mg | Freq: Every day | INTRAMUSCULAR | Status: DC

## 2020-01-18 MED ORDER — POLYETHYLENE GLYCOL 3350 17 G PO PACK
17.0000 g | PACK | Freq: Every day | ORAL | Status: DC
  Administered 2020-01-18: 17 g via ORAL
  Filled 2020-01-18: qty 1

## 2020-01-18 MED ORDER — PREDNISONE 20 MG PO TABS: 30.0000 mg | ORAL_TABLET | Freq: Every day | ORAL | Status: DC

## 2020-01-18 MED ORDER — HEPARIN (PORCINE) 25000 UT/250ML-% IV SOLN
1100.0000 [IU]/h | INTRAVENOUS | Status: DC
  Administered 2020-01-18: 1100 [IU]/h via INTRAVENOUS

## 2020-01-18 MED ORDER — PREDNISONE 10 MG PO TABS: 30.0000 mg | ORAL_TABLET | Freq: Every day | ORAL | Status: DC

## 2020-01-18 MED ORDER — AMIODARONE HCL 400 MG PO TABS: 400.0000 mg | ORAL_TABLET | Freq: Two times a day (BID) | ORAL | Status: DC

## 2020-01-18 MED ORDER — FUROSEMIDE 10 MG/ML IJ SOLN
60.0000 mg | Freq: Once | INTRAMUSCULAR | Status: AC
  Administered 2020-01-18: 60 mg via INTRAVENOUS
  Filled 2020-01-18: qty 6

## 2020-01-18 MED ORDER — HEPARIN (PORCINE) 25000 UT/250ML-% IV SOLN: 900.0000 [IU]/h | INTRAVENOUS | Status: DC

## 2020-01-18 MED ORDER — OXYCODONE HCL 5 MG PO TABS
5.0000 mg | ORAL_TABLET | Freq: Four times a day (QID) | ORAL | 0 refills | Status: DC | PRN
Start: 2020-01-18 — End: 2020-03-26

## 2020-01-18 MED ORDER — HEPARIN (PORCINE) 25000 UT/250ML-% IV SOLN: 1100.0000 [IU]/h | INTRAVENOUS | Status: DC

## 2020-01-18 MED ORDER — ALPRAZOLAM 0.25 MG PO TABS
0.2500 mg | ORAL_TABLET | Freq: Three times a day (TID) | ORAL | Status: DC | PRN
  Administered 2020-01-18: 0.25 mg via ORAL
  Filled 2020-01-18: qty 1

## 2020-01-18 MED ORDER — INSULIN DETEMIR 100 UNIT/ML ~~LOC~~ SOLN: 7.0000 [IU] | Freq: Two times a day (BID) | SUBCUTANEOUS | 11 refills | Status: DC

## 2020-01-18 MED ORDER — INSULIN DETEMIR 100 UNIT/ML ~~LOC~~ SOLN
7.0000 [IU] | Freq: Two times a day (BID) | SUBCUTANEOUS | Status: DC
  Administered 2020-01-18: 7 [IU] via SUBCUTANEOUS
  Filled 2020-01-18 (×2): qty 0.07

## 2020-01-18 NOTE — Progress Notes (Signed)
Patient Name: Caleb [name] Date of Encounter: 01/18/2020  Hospital Problem List     Active Problems:   Hx of lung transplant [facility])   Closed right hip fracture (HCC)   Acute respiratory failure with hypoxia (HCC)   COPD with acute exacerbation (HCC)   Multifocal pneumonia   Type 2 diabetes mellitus with hyperlipidemia ([place])   Acute kidney injury superimposed on CKD [facility])    Patient Profile      74 y.o. male with history of pulmonary fibrosis s/p right lung trpt, history of syncope with ppm in place followed at [facility].  He had wide-complex tachycardia last p.m.  Appeared to be A. fib with RVR.  Pt tolerated hemodynamically. Was given iv metoprolol without improvement followed by iv bolus followed by drip of amiodarone. Rate slowed showing afib with variable ventricular response.  Still in A. fib this morning with better controlled heart rate.  On IV heparin.  Awaiting transfer to [facility] for consideration for orthopedic surgery.  Subjective   Rate better controlled.  Denies shortness of breath or chest pain.  Inpatient Medications    . amiodarone  400 mg Oral BID  . Chlorhexidine Gluconate Cloth  6 each Topical Daily  . cholecalciferol  1,000 Units Oral Daily  . insulin aspart  0-9 Units Subcutaneous TID WC  . insulin aspart  6 Units Subcutaneous TID WC  . insulin detemir  10 Units Subcutaneous BID AC & HS  . ipratropium-albuterol  3 mL Nebulization QID  . loratadine  10 mg Oral Daily  . methylPREDNISolone (SOLU-MEDROL) injection  40 mg Intravenous Q12H  . metoprolol tartrate  25 mg Oral BID  . mometasone-formoterol  2 puff Inhalation BID  . multivitamin with minerals  1 tablet Oral Daily  . pantoprazole  40 mg Oral BID  . polyethylene glycol  17 g Oral Daily  . pravastatin  20 mg Oral q1800  . Sirolimus  1 mg Oral Daily  . sulfamethoxazole-trimethoprim  1 tablet Oral Once per day on Mon Wed Fri  . tacrolimus  1 mg Oral BID  . tamsulosin  0.4 mg Oral QPC breakfast  .  [START ON 01/20/2020] valGANciclovir  450 mg Oral Once per day on Mon Thu    Vital Signs    Vitals:   01/18/20 0800 01/18/20 0835 01/18/20 0900 01/18/20 1007  BP: (!) 116/92  (!) 130/91   Pulse: 92  99   Resp: 20  20   Temp:  97.7 F (36.5 C)    TempSrc:  Oral    SpO2: 99%  97% 97%  Weight:      Height:        Intake/Output Summary (Last 24 hours) at 01/18/2020 1107 Last data filed at 01/17/2020 2316 Gross per 24 hour  Intake 293.02 ml  Output 350 ml  Net -56.98 ml   Filed Weights   01/14/20 1200  Weight: 103.4 kg    Physical Exam    GEN: Well nourished, well developed, in no acute distress.  HEENT: normal.  Neck: Supple, no JVD, carotid bruits, or masses. Cardiac: Irregular regular rhythm with tachycardia Respiratory: Bilateral coarse rhonchi. GI: Soft, nontender, nondistended, BS + x 4. MS: no deformity or atrophy. Skin: warm and dry, no rash. Neuro:  Strength and sensation are intact. Psych: Normal affect.  Labs    CBC Recent Labs    01/16/20 0448  WBC 14.0*  HGB 9.4*  HCT 29.2*  MCV 82.7  PLT 850*   Basic Metabolic  Panel Recent Labs    01/16/20 0448 01/17/20 0751  NA 135 136  K 4.3 4.2  CL 100 104  CO2 21* 20*  GLUCOSE 359* 212*  BUN 86* 99*  CREATININE 3.70* 4.05*  CALCIUM 8.4* 8.3*   Liver Function Tests No results for input(s): AST, ALT, ALKPHOS, BILITOT, PROT, ALBUMIN in the last 72 hours. No results for input(s): LIPASE, AMYLASE in the last 72 hours. Cardiac Enzymes No results for input(s): CKTOTAL, CKMB, CKMBINDEX, TROPONINI in the last 72 hours. BNP No results for input(s): BNP in the last 72 hours. D-Dimer No results for input(s): DDIMER in the last 72 hours. Hemoglobin A1C No results for input(s): HGBA1C in the last 72 hours. Fasting Lipid Panel No results for input(s): CHOL, HDL, LDLCALC, TRIG, CHOLHDL, LDLDIRECT in the last 72 hours. Thyroid Function Tests No results for input(s): TSH, T4TOTAL, T3FREE, THYROIDAB in the  last 72 hours.  Invalid input(s): FREET3  Telemetry    Atrial fibrillation with variable but fairly rapid ventricular response at times.  ECG    A. fib with RVR.  Radiology    DG Chest 2 View  Result Date: 01/13/2020 CLINICAL DATA:  Syncopal event with fall, former smoker, history of lung transplant EXAM: CHEST - 2 VIEW COMPARISON:  06/25/2013 chest radiograph. FINDINGS: Right internal jugular Port-A-Cath terminates over the cavoatrial junction. Three lead left subclavian ICD with lead tips overlying the right atrium, right ventricle and coronary sinus. Stable cardiomediastinal silhouette with mild cardiomegaly. No pneumothorax. No pleural effusion. Asymmetric volume loss in the left hemithorax. Patchy and reticular opacities throughout the left lung, worsened from prior. Minimal scarring at the right lung base. Otherwise clear right lung. No displaced fractures in the visualized chest. IMPRESSION: 1. Minimal scarring at the right lung base. Otherwise clear right lung. 2. 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 3. Mild cardiomegaly. Electronically Signed   By: Ilona Sorrel M.D.   On: 01/13/2020 20:18   DG Elbow Complete Right  Result Date: 01/13/2020 CLINICAL DATA:  Syncopal episode with right elbow pain EXAM: RIGHT ELBOW - COMPLETE 3+ VIEW COMPARISON:  None. FINDINGS: No fracture, joint effusion or dislocation. No suspicious focal osseous lesions. No significant degenerative arthropathy. No radiopaque foreign body. IMPRESSION: No right elbow fracture, joint effusion or malalignment. Electronically Signed   By: Ilona Sorrel M.D.   On: 01/13/2020 20:13   CT CHEST WO CONTRAST  Result Date: 01/14/2020 CLINICAL DATA:  Syncope yesterday, difficulty breathing, history of lung transplant EXAM: CT CHEST WITHOUT CONTRAST TECHNIQUE: Multidetector CT imaging of the chest was performed following the standard protocol without IV contrast. COMPARISON:  01/13/2020,  01/14/2020, 09/12/2013 FINDINGS: Cardiovascular: Multi lead pacer is identified. There is diffuse atherosclerosis of the aorta and coronary vasculature. The heart is unremarkable without pericardial effusion. Mediastinum/Nodes: No enlarged mediastinal or axillary lymph nodes. Thyroid gland, trachea, and esophagus demonstrate no significant findings. Lungs/Pleura: Chronic fibrosis and volume loss within the left lung. Postsurgical changes from right lung transplant. There are patchy areas of ground-glass airspace disease throughout the right lung, greatest in the right lower lobe. Findings could reflect multifocal pneumonia, hemorrhage, drug reaction, or early edema. No pleural effusion or pneumothorax. Central airways are patent. Upper Abdomen: No acute abnormality. Musculoskeletal: No acute or destructive bony lesions. Reconstructed images demonstrate no additional findings. IMPRESSION: 1. Patchy ground-glass airspace disease throughout the right lung transplant. Favor multifocal pneumonia, though hemorrhage or drug reaction could also give this appearance. Edema is felt to be  less likely without associated effusion or cardiomegaly. 2. Chronic scarring and fibrosis throughout the left lung, with severe left lung volume loss. 3.  Aortic Atherosclerosis (ICD10-I70.0). Electronically Signed   By: Randa Ngo M.D.   On: 01/14/2020 17:03   NM Pulmonary Perfusion  Result Date: 01/14/2020 CLINICAL DATA:  History of RIGHT lung transplant. Short of breath, concern for pulmonary embolism. EXAM: NUCLEAR MEDICINE PERFUSION LUNG SCAN TECHNIQUE: Perfusion images were obtained in multiple projections after intravenous injection of radiopharmaceutical. RADIOPHARMACEUTICALS:  4.6 mCi Tc-16m MAA COMPARISON:  Chest radiograph 09/13/2019, CT 09/12/2013 FINDINGS: There is near absent perfusion to the LEFT lung which corresponds to near complete opacification on comparison radiograph. History of chronic interstitial lung disease  on the LEFT. A small peripheral perfusion defects within the RIGHT lung. Findings are more suggestive of COPD than acute or chronic PE. IMPRESSION: 1. No convincing evidence acute or chronic PE within the RIGHT lung. 2. 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 3. Near absent perfusion to the LEFT lung corresponds to dense opacification on comparison radiograph and history of chronic LEFT lung fibrosis. Electronically Signed   By: Suzy Bouchard M.D.   On: 01/14/2020 15:13   US Carotid Bilateral  Result Date: 01/14/2020 CLINICAL DATA:  Syncope, hypertension, hyperlipidemia EXAM: BILATERAL CAROTID DUPLEX ULTRASOUND TECHNIQUE: Pearline Cables scale imaging, color Doppler and duplex ultrasound were performed of bilateral carotid and vertebral arteries in the neck. COMPARISON:  None. FINDINGS: Criteria: Quantification of carotid stenosis is based on velocity parameters that correlate the residual internal carotid diameter with NASCET-based stenosis levels, using the diameter of the distal internal carotid lumen as the denominator for stenosis measurement. The following velocity measurements were obtained: RIGHT ICA: 107/41 cm/sec CCA: 15/17 cm/sec SYSTOLIC ICA/CCA RATIO:  1.3 ECA: 79 cm/sec LEFT ICA: 83/28 cm/sec CCA: 61/60 cm/sec SYSTOLIC ICA/CCA RATIO:  1.0 ECA: 85 cm/sec RIGHT CAROTID ARTERY: Minor intimal thickening and plaque formation. No hemodynamically significant right ICA stenosis, velocity elevation, or turbulent flow. Degree of narrowing less than 50%. RIGHT VERTEBRAL ARTERY:  Normal antegrade flow LEFT CAROTID ARTERY: Similar minor intimal thickening and plaque formation. No hemodynamically significant left ICA stenosis, velocity elevation, or turbulent flow. LEFT VERTEBRAL ARTERY:  Normal antegrade flow IMPRESSION: Minor carotid atherosclerosis. No hemodynamically significant ICA stenosis. Degree of narrowing less than 50% bilaterally by  ultrasound criteria. Patent antegrade vertebral flow bilaterally Electronically Signed   By: Jerilynn Mages.  Shick M.D.   On: 01/14/2020 11:05   DG Chest Port 1 View  Result Date: 01/14/2020 CLINICAL DATA:  74 year old male with history of acute respiratory distress. EXAM: PORTABLE CHEST 1 VIEW COMPARISON:  Chest x-ray 01/13/2020. FINDINGS: Extensive areas of interstitial prominence and patchy airspace opacities are noted in the lungs bilaterally (left greater than right). Near complete opacification of the left hemithorax which corresponds to extensive fibrotic changes noted on recent chest CT. No pleural effusions. No evidence of pulmonary edema. No pneumothorax. Heart size is normal. Mediastinal contours are distorted in shifted into the left hemithorax related to patient's chronic left-sided volume loss. Aortic atherosclerosis. Right internal jugular single-lumen porta cath with tip terminating at the superior cavoatrial junction. Left-sided biventricular pacemaker/AICD with lead tips projecting over the expected location of the right atrium, right ventricle and overlying the lateral wall the left ventricle via the coronary sinus and coronary veins. IMPRESSION: 1. Chronic lung changes related to underlying pulmonary fibrosis and likely superimposed acute bronchopneumonia, as above, with slightly worsened aeration in the  right lung when compared to the recent prior examination. Electronically Signed   By: Vinnie Langton M.D.   On: 01/14/2020 20:17   ECHOCARDIOGRAM COMPLETE  Result Date: 01/14/2020    ECHOCARDIOGRAM REPORT   Patient Name:   Caleb [name] Date of Exam: 01/14/2020 Medical Rec #:  [id number]          Height:       69.0 in Accession #:    [id number]         Weight:       228.0 lb Date of Birth:  [birth year]          BSA:          2.184 m Patient Age:    53 years           BP:           Not listed in chart/Not listed                                                  in chart mmHg Patient Gender: M                   HR:           Not listed in chart bpm. Exam Location:  [facility] Procedure: 2D Echo, Cardiac Doppler and Color Doppler Indications:     Syncope 780.2  History:         Patient has no prior history of Echocardiogram examinations.                  Signs/Symptoms:Chest Pain; Risk Factors:Hypertension,                  Dyslipidemia and Diabetes.  Sonographer:     Sherrie Sport RDCS (AE) Referring Phys:  [id number] Arvella Merles MANSY Diagnosing Phys: Harrell Gave End MD  Sonographer Comments: Technically difficult study due to poor echo windows, suboptimal parasternal window, no apical window and no subcostal window. IMPRESSIONS  1. Left ventricular ejection fraction, by estimation, is >55%. The left ventricle has normal function. Left ventricular endocardial border not optimally defined to evaluate regional wall motion. There is moderate to severe asymmetric left ventricular hypertrophy. Left ventricular diastolic function could not be evaluated.  2. Right ventricular systolic function is low normal. The right ventricular size is not well visualized. There is moderately elevated pulmonary artery systolic pressure.  3. The mitral valve is degenerative. Unable to accurately assess mitral valve regurgitation.  4. Tricuspid valve regurgitation is mild to moderate.  5. The aortic valve is tricuspid. Aortic valve regurgitation not well assessed.  6. Moderately dilated pulmonary artery. FINDINGS  Left Ventricle: Left ventricular ejection fraction, by estimation, is >55%. The left ventricle has normal function. Left ventricular endocardial border not optimally defined to evaluate regional wall motion. The left ventricular internal cavity size was  normal in size. There is moderate to severe asymmetric left ventricular hypertrophy. Left ventricular diastolic function could not be evaluated. Right Ventricle: Pulmonary artery systolic pressure is 75-17 mmHg plus central venous pressure. The right ventricular size is not well visualized.  No increase in right ventricular wall thickness. Right ventricular systolic function is low normal. There is moderately elevated pulmonary artery systolic pressure. Left Atrium: Left atrial size was not well visualized. Right Atrium: Right atrial size was not well visualized. Pericardium: The pericardium  was not well visualized. Mitral Valve: The mitral valve is degenerative in appearance. There is mild thickening of the mitral valve leaflet(s). Unable to accurately assess mitral valve regurgitation. Tricuspid Valve: The tricuspid valve is normal in structure. Tricuspid valve regurgitation is mild to moderate. Aortic Valve: The aortic valve is tricuspid. Aortic valve regurgitation not well assessed. Pulmonic Valve: The pulmonic valve was grossly normal. Pulmonic valve regurgitation is mild. No evidence of pulmonic stenosis. Aorta: The aortic root is normal in size and structure. Pulmonary Artery: The pulmonary artery is moderately dilated. Venous: The inferior vena cava was not well visualized. IAS/Shunts: The interatrial septum was not well visualized. Additional Comments: A pacer wire is visualized in the right ventricle.  LEFT VENTRICLE PLAX 2D LVIDd:         4.26 cm LVIDs:         2.70 cm LV PW:         1.34 cm LV IVS:        1.72 cm LVOT diam:     2.00 cm LVOT Area:     3.14 cm  LEFT ATRIUM         Index LA diam:    4.90 cm 2.24 cm/m                        PULMONIC VALVE AORTA                 PV Vmax:        0.76 m/s Ao Root diam: 3.20 cm PV Peak grad:   2.3 mmHg                       RVOT Peak grad: 4 mmHg  TRICUSPID VALVE TR Peak grad:   51.3 mmHg TR Vmax:        358.00 cm/s  SHUNTS Systemic Diam: 2.00 cm Nelva Bush MD Electronically signed by Nelva Bush MD Signature Date/Time: 01/14/2020/3:18:50 PM    Final    DG Hip Unilat W or Wo Pelvis 2-3 Views Right  Result Date: 01/13/2020 CLINICAL DATA:  74 year old male with fall and right hip pain. EXAM: DG HIP (WITH OR WITHOUT PELVIS) 2-3V RIGHT  COMPARISON:  CT of the abdomen pelvis dated 06/17/2013. FINDINGS: Minimally displaced fracture of the right femoral neck. There is no dislocation. The bones are osteopenic. The soft tissues are unremarkable. IMPRESSION: Minimally displaced fracture of the right femoral neck. Electronically Signed   By: Anner Crete M.D.   On: 01/13/2020 20:14    Assessment & Plan  74 year old male with history of pulmonary fibrosis status post lung transplant, history of permanent pacemaker, diabetes admitted with right hip fracture.  Awaiting transfer to [facility] for surgery.  No has renal insufficiency as well.  Had A. fib with RVR last p.m. Rate has improved with iv amiodarone bolus and drip. Placing magnet on his pacemaker did not break rhythm.Not candidate for NOAC due to pending orthopedic surgery Will try to limit long term use of amiodarone given lung transplant however data reported acceptable use in this population, at least short term.  Will convert to p.o. amiodarone 400 twice daily for now.  Will discuss with advanced heart/pulmonary failure at [facility]  Had a difficult to visualize echo however appears to have preserved LV function by echo done several days ago.  Lung disease-s/p lung trpt. Being treated for acute respiratory failure. On cpap at night and on 4 liters oxygen per nasal canula.  Right hip fx-awaiting transfer to [facility] for surgery  DM- on insulin and pravastatin  Renal insufficiency-creatinine 4.05 today up from 3.7.  Will need to consider nephrology assistance. Signed, Javier Docker Evie Crumpler MD 01/18/2020, 11:07 AM  Pager: [phone]

## 2020-01-18 NOTE — Progress Notes (Signed)
Patient ID: Caleb [name], male   DOB: 04-30-1946, 74 y.o.   MRN: [id number] [facility] PROGRESS NOTE  Caleb [name] SJG:[id number] DOB: 06-08-1945 DOA: 01/13/2020 PCP: Lavera Guise, MD  HPI/Subjective: Patient complains of swelling in his arms and legs.  Breathing is okay.  Still coughing up some blood-tinged sputum.  Came in initially with a hip fracture.  Objective: Vitals:   01/18/20 1200 01/18/20 1346  BP:    Pulse: (!) 124   Resp: (!) 24   Temp:    SpO2: 99% 96%    Intake/Output Summary (Last 24 hours) at 01/18/2020 1702 Last data filed at 01/18/2020 1300 Gross per 24 hour  Intake 240 ml  Output 350 ml  Net -110 ml   Filed Weights   01/14/20 1200  Weight: 103.4 kg    ROS: Review of Systems  Respiratory: Positive for cough and shortness of breath.   Cardiovascular: Negative for chest pain.  Gastrointestinal: Positive for constipation. Negative for abdominal pain, nausea and vomiting.  Musculoskeletal: Positive for joint pain.   Exam: Physical Exam HENT:     Nose: No mucosal edema.     Mouth/Throat:     Pharynx: No oropharyngeal exudate.  Eyes:     General: Lids are normal.     Conjunctiva/sclera: Conjunctivae normal.     Pupils: Pupils are equal, round, and reactive to light.  Cardiovascular:     Rate and Rhythm: Tachycardia present. Rhythm irregularly irregular.     Heart sounds: Normal heart sounds, S1 normal and S2 normal.  Pulmonary:     Breath sounds: Examination of the left-middle field reveals decreased breath sounds. Examination of the right-lower field reveals decreased breath sounds and rhonchi. Examination of the left-lower field reveals decreased breath sounds and rhonchi. Decreased breath sounds and rhonchi present. No wheezing or rales.  Abdominal:     Palpations: Abdomen is soft.     Tenderness: There is no abdominal tenderness.  Musculoskeletal:     Right upper arm: Swelling present.     Left upper arm: Swelling present.      Right ankle: Swelling present.     Left ankle: Swelling present.  Skin:    General: Skin is warm.     Comments: Skin tear right arm.  Neurological:     Mental Status: He is alert and oriented to person, place, and time.       Data Reviewed: Basic Metabolic Panel: Recent Labs  Lab 01/14/20 0509 01/14/20 2117 01/16/20 0448 01/17/20 0751 01/18/20 1125  NA 134* 132* 135 136 134*  K 3.6 4.3 4.3 4.2 5.0  CL 97* 97* 100 104 99  CO2 27 23 21* 20* 19*  GLUCOSE 164* 261* 359* 212* 327*  BUN 44* 50* 86* 99* 111*  CREATININE 2.53* 3.01* 3.70* 4.05* 5.06*  CALCIUM 8.2* 8.1* 8.4* 8.3* 8.6*  MG  --  2.1  --   --   --    Liver Function Tests: Recent Labs  Lab 01/14/20 2117  AST 26  ALT 17  ALKPHOS 58  BILITOT 1.2  PROT 6.2*  ALBUMIN 3.3*   CBC: Recent Labs  Lab 01/13/20 2012 01/14/20 2117 01/16/20 0448 01/17/20 2257  WBC 10.8* 13.5* 14.0* 14.4*  NEUTROABS  --  12.3*  --   --   HGB 10.1* 11.1* 9.4* 9.7*  HCT 31.9* 33.6* 29.2* 30.7*  MCV 82.9 81.0 82.7 82.7  PLT 143* 139* 133* 163   BNP (last 3 results) Recent Labs  01/14/20 1823  BNP 990.4*    CBG: Recent Labs  Lab 01/17/20 1710 01/17/20 2149 01/18/20 0835 01/18/20 1227 01/18/20 1648  GLUCAP 399* 362* 247* 287* 340*    Recent Results (from the past 240 hour(s))  SARS Coronavirus 2 by RT PCR (hospital order, performed in [facility] hospital lab) Nasopharyngeal Nasopharyngeal Swab     Status: None   Collection Time: 01/13/20  8:12 PM   Specimen: Nasopharyngeal Swab  Result Value Ref Range Status   SARS Coronavirus 2 NEGATIVE NEGATIVE Final    Comment: (NOTE) SARS-CoV-2 target nucleic acids are NOT DETECTED.  The SARS-CoV-2 RNA is generally detectable in upper and lower respiratory specimens during the acute phase of infection. The lowest concentration of SARS-CoV-2 viral copies this assay can detect is 250 copies / mL. A negative result does not preclude SARS-CoV-2 infection and should not be  used as the sole basis for treatment or other patient management decisions.  A negative result may occur with improper specimen collection / handling, submission of specimen other than nasopharyngeal swab, presence of viral mutation(s) within the areas targeted by this assay, and inadequate number of viral copies (<250 copies / mL). A negative result must be combined with clinical observations, patient history, and epidemiological information.  Fact Sheet for Patients:   StrictlyIdeas.no  Fact Sheet for Healthcare Providers: BankingDealers.co.za  This test is not yet approved or  cleared by the [place] FDA and has been authorized for detection and/or diagnosis of SARS-CoV-2 by FDA under an Emergency Use Authorization (EUA).  This EUA will remain in effect (meaning this test can be used) for the duration of the COVID-19 declaration under Section 564(b)(1) of the Act, 21 U.S.C. section 360bbb-3(b)(1), unless the authorization is terminated or revoked sooner.  Performed at [facility], [address] [place], [place] [address]   MRSA PCR Screening     Status: None   Collection Time: 01/15/20  8:15 AM   Specimen: Nasal Mucosa; Nasopharyngeal  Result Value Ref Range Status   MRSA by PCR NEGATIVE NEGATIVE Final    Comment:        The GeneXpert MRSA Assay (FDA approved for NASAL specimens only), is one component of a comprehensive MRSA colonization surveillance program. It is not intended to diagnose MRSA infection nor to guide or monitor treatment for MRSA infections. Performed at [facility], [address] [place], [place] [address]      Studies: No results found.  Scheduled Meds: . amiodarone  400 mg Oral BID  . Chlorhexidine Gluconate Cloth  6 each Topical Daily  . cholecalciferol  1,000 Units Oral Daily  . insulin aspart  0-9 Units Subcutaneous TID WC  . insulin aspart  6 Units Subcutaneous  TID WC  . insulin detemir  7 Units Subcutaneous BID AC & HS  . ipratropium-albuterol  3 mL Nebulization QID  . loratadine  10 mg Oral Daily  . metoprolol tartrate  25 mg Oral BID  . mometasone-formoterol  2 puff Inhalation BID  . multivitamin with minerals  1 tablet Oral Daily  . pantoprazole  40 mg Oral BID  . polyethylene glycol  17 g Oral Daily  . pravastatin  20 mg Oral q1800  . [START ON 01/19/2020] predniSONE  30 mg Oral Q breakfast  . Sirolimus  1 mg Oral Daily  . sulfamethoxazole-trimethoprim  1 tablet Oral Once per day on Mon Wed Fri  . tacrolimus  1 mg Oral BID  . tamsulosin  0.4  mg Oral QPC breakfast  . [START ON 01/20/2020] valGANciclovir  450 mg Oral Once per day on Mon Thu   Continuous Infusions: . ceFEPime (MAXIPIME) IV Stopped (01/17/20 2206)  . heparin 1,100 Units/hr (01/18/20 1412)    Assessment/Plan:  1. Acute hypoxic respiratory failure.  BiPAP at night.  I dialed them down to 3 L of oxygen during the day. 2. Atrial fibrillation with rapid ventricular response was on amiodarone drip this morning and switched over to oral amiodarone 400 mg twice a day.  Also on metoprolol.  Heparin drip for anticoagulation. 3. Right hip fracture.  Still awaiting transfer over to [facility] for surgical procedure. 4. COPD exacerbation with multifocal pneumonia.  Change Solu-Medrol over to prednisone for tomorrow.  On Maxipime. 5. Acute kidney injury on chronic kidney disease stage IIIb.  Creatinine elevated to 5 today.  Patient feels that he is more swollen today and I did give another dose of IV Lasix.  Patient has a Foley catheter in.  We will get nephrology consultation. 6. Type 2 diabetes mellitus with hyperlipidemia on pravastatin.  On Levemir insulin will decrease dose down to 8 units twice a day since I am decreasing the steroids.  Continue sliding scale insulin. 7. Anemia unspecified continue to watch hemoglobin 8. BPH on Flomax 9. Chronic diastolic congestive heart failure.  Patient  feels he is more swollen today I will give another dose of IV Lasix today and reassess tomorrow.    Code Status:     Code Status Orders  (From admission, onward)         Start     Ordered   01/13/20 2241  Full code  Continuous        01/13/20 2242        Code Status History    This patient has a current code status but no historical code status.   Advance Care Planning Activity     Family Communication: Wife at bedside Disposition Plan: Status is: Inpatient  Dispo: The patient is from: Home              Anticipated d/c is to: [facility]              Anticipated d/c date is: Hoping every day that it will be transferred to [facility].              Patient currently needs a hip operation for fractured hip.  Consultants:  Cardiology  Nephrology  Antibiotics:  Maxipime  Time spent: 27 minutes  [place]

## 2020-01-18 NOTE — Progress Notes (Addendum)
[place] for  Heparin drip Indication: Atrial Fibrillation  Allergies  Allergen Reactions  . 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    Patient Measurements: Height: 5\' 9"  (175.3 cm) Weight:  (low bed. no lift. nurse notified.) IBW/kg (Calculated) : 70.7 Heparin Dosing Weight: 92.9 kg  Vital Signs: Temp: 97.6 F (36.4 C) (08/21 1954) Temp Source: Oral (08/21 1954) BP: 125/67 (08/21 1954) Pulse Rate: 82 (08/21 1954)  Labs: Recent Labs    01/16/20 0448 01/17/20 0751 01/17/20 1820 01/17/20 2257 01/18/20 1125 01/18/20 2031  HGB 9.4*  --   --  9.7*  --   --   HCT 29.2*  --   --  30.7*  --   --   PLT 133*  --   --  163  --   --   APTT  --   --  26  --   --   --   LABPROT  --   --  14.1  --   --   --   INR  --   --  1.1  --   --   --   HEPARINUNFRC  --   --   --   --  1.56* 1.16*  CREATININE 3.70* 4.05*  --   --  5.06*  --     Estimated Creatinine Clearance: 15.9 mL/min (A) (by C-G formula based on SCr of 5.06 mg/dL (H)).   Medical History: Past Medical History:  Diagnosis Date  . BPH (benign prostatic hyperplasia)   . Chest pain, unspecified   . Diabetes mellitus without complication ([place])   . Dyspnea   . Hyperlipidemia   . Hypertension     Medications:  Scheduled:  . amiodarone  400 mg Oral BID  . Chlorhexidine Gluconate Cloth  6 each Topical Daily  . cholecalciferol  1,000 Units Oral Daily  . insulin aspart  0-9 Units Subcutaneous TID WC  . insulin aspart  6 Units Subcutaneous TID WC  . insulin detemir  7 Units Subcutaneous BID AC & HS  . ipratropium-albuterol  3 mL Nebulization QID  . loratadine  10 mg Oral Daily  . metoprolol tartrate  25 mg Oral BID  . mometasone-formoterol  2 puff Inhalation BID  . multivitamin with minerals  1 tablet Oral Daily  . pantoprazole  40 mg Oral BID  . polyethylene glycol  17 g Oral Daily  .  pravastatin  20 mg Oral q1800  . [START ON 01/19/2020] predniSONE  30 mg Oral Q breakfast  . Sirolimus  1 mg Oral Daily  . sulfamethoxazole-trimethoprim  1 tablet Oral Once per day on Mon Wed Fri  . tacrolimus  1 mg Oral BID  . tamsulosin  0.4 mg Oral QPC breakfast  . [START ON 01/20/2020] valGANciclovir  450 mg Oral Once per day on Mon Thu   Infusions:  . ceFEPime (MAXIPIME) IV Stopped (01/17/20 2206)  . heparin 1,100 Units/hr (01/18/20 1412)    Assessment: 74 yo male to start Heparin drip for Afib Hgb 11.1  plt 139  INR 1.1  APTT 26 S/p lung transplant, pacemaker.  Not candidate for NOAC due to pending orthopedic surgery. Right hip fx-awaiting transfer to [facility] for surgery  8/20 Give 4600 units bolus x 1, Start heparin infusion at 1400 units/hr 8/21 @1125   HL= 1.56.  Will held drip x 1 hour and decreased rate to 1100  units/hr.   Goal of Therapy:  Heparin level 0.3-0.7 units/ml Monitor platelets by anticoagulation protocol: Yes   Plan:  8/21 @1125   HL= 1.16. SUPRAtherapeutic s/p held infusion x 1 hr and reduced rate.  Will hold drip x 1 hour and decreased rate to 900 units/hr - nursing informed of changes. Infusion should re-start at ~ 2300. Confirmed with nurse no bleeding events noted. Per RN, patient will transfer to [facility]. Recheck HL in 6 hrs and CBC with AM labs.  Rowland Lathe 01/18/2020,9:49 PM

## 2020-01-18 NOTE — Progress Notes (Signed)
Bipap on standby. Patient transferring to [facility]. Awaiting covid swab.

## 2020-01-18 NOTE — Progress Notes (Signed)
Caleb [name] visited pt. per request of RN, who shared that pt. is a lung transplant pt. who recently suffered a fall.  When [facility] arrived, pt. sitting up in bed supported by pillows, reading menu; wife Caleb [name] at bedside.  Pt. shared that plan is to transfer to [facility] for surgery to repair his fractured hip; it is uncertain when bed at [facility] might open up and this uncertainty in waiting along w/pt.'s pain and immobility are significant challenges; additionally, pt.'s kidney function is minimal and he has CHF.  CH offered prayer for bed at [facility] to open up and for pt. and wife's sense of divine presence and comfort as they wait.  Family and pt. would likely benefit from follow-up support as pt. faces daunting health challenges ahead of him.

## 2020-01-18 NOTE — Progress Notes (Addendum)
[place] for  Heparin drip Indication: Atrial Fibrillation  Allergies  Allergen Reactions  . 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    Patient Measurements: Height: 5\' 9"  (175.3 cm) Weight:  (low bed. no lift. nurse notified.) IBW/kg (Calculated) : 70.7 Heparin Dosing Weight: 92.9 kg  Vital Signs: Temp: 97.7 F (36.5 C) (08/21 0835) Temp Source: Oral (08/21 0835) BP: 130/91 (08/21 0900) Pulse Rate: 124 (08/21 1200)  Labs: Recent Labs    01/16/20 0448 01/17/20 0751 01/17/20 1820 01/17/20 2257 01/18/20 1125  HGB 9.4*  --   --  9.7*  --   HCT 29.2*  --   --  30.7*  --   PLT 133*  --   --  163  --   APTT  --   --  26  --   --   LABPROT  --   --  14.1  --   --   INR  --   --  1.1  --   --   HEPARINUNFRC  --   --   --   --  1.56*  CREATININE 3.70* 4.05*  --   --  5.06*    Estimated Creatinine Clearance: 15.9 mL/min (A) (by C-G formula based on SCr of 5.06 mg/dL (H)).   Medical History: Past Medical History:  Diagnosis Date  . BPH (benign prostatic hyperplasia)   . Chest pain, unspecified   . Diabetes mellitus without complication ([place])   . Dyspnea   . Hyperlipidemia   . Hypertension     Medications:  Scheduled:  . amiodarone  400 mg Oral BID  . Chlorhexidine Gluconate Cloth  6 each Topical Daily  . cholecalciferol  1,000 Units Oral Daily  . insulin aspart  0-9 Units Subcutaneous TID WC  . insulin aspart  6 Units Subcutaneous TID WC  . insulin detemir  10 Units Subcutaneous BID AC & HS  . ipratropium-albuterol  3 mL Nebulization QID  . loratadine  10 mg Oral Daily  . methylPREDNISolone (SOLU-MEDROL) injection  40 mg Intravenous Q12H  . metoprolol tartrate  25 mg Oral BID  . mometasone-formoterol  2 puff Inhalation BID  . multivitamin with minerals  1 tablet Oral Daily  . pantoprazole  40 mg Oral BID  . polyethylene glycol  17 g Oral  Daily  . pravastatin  20 mg Oral q1800  . Sirolimus  1 mg Oral Daily  . sulfamethoxazole-trimethoprim  1 tablet Oral Once per day on Mon Wed Fri  . tacrolimus  1 mg Oral BID  . tamsulosin  0.4 mg Oral QPC breakfast  . [START ON 01/20/2020] valGANciclovir  450 mg Oral Once per day on Mon Thu   Infusions:  . ceFEPime (MAXIPIME) IV Stopped (01/17/20 2206)  . heparin 1,400 Units/hr (01/18/20 2353)    Assessment: 74 yo male to start Heparin drip for Afib Hgb 11.1  plt 139  INR 1.1  APTT 26 S/p lung transplant, pacemaker.  Not candidate for NOAC due to pending orthopedic surgery. Right hip fx-awaiting transfer to [facility] for surgery  8/20 Give 4600 units bolus x 1, Start heparin infusion at 1400 units/hr   Goal of Therapy:  Heparin level 0.3-0.7 units/ml Monitor platelets by anticoagulation protocol: Yes   Plan:  8/21 @1125   HL= 1.56.  Will hold drip x 1 hour and decrease rate to 1100 units/hr. Recheck HL in 6 hrs (  note: called lab this am concerning lab not being drawn. They said they did not see an order for it ( but the order was active in [place]). Had to reenter HL order and lab said they would draw when they had available staff)   Katalaya Beel A 01/18/2020,1:10 PM

## 2020-01-19 DIAGNOSIS — S72001A Fracture of unspecified part of neck of right femur, initial encounter for closed fracture: Secondary | ICD-10-CM | POA: Diagnosis present

## 2020-01-19 DIAGNOSIS — N179 Acute kidney failure, unspecified: Secondary | ICD-10-CM | POA: Diagnosis present

## 2020-01-19 DIAGNOSIS — K5641 Fecal impaction: Secondary | ICD-10-CM | POA: Diagnosis not present

## 2020-01-19 DIAGNOSIS — Z7952 Long term (current) use of systemic steroids: Secondary | ICD-10-CM | POA: Diagnosis not present

## 2020-01-19 DIAGNOSIS — D509 Iron deficiency anemia, unspecified: Secondary | ICD-10-CM | POA: Diagnosis present

## 2020-01-19 DIAGNOSIS — S72011A Unspecified intracapsular fracture of right femur, initial encounter for closed fracture: Secondary | ICD-10-CM | POA: Diagnosis not present

## 2020-01-19 DIAGNOSIS — G4700 Insomnia, unspecified: Secondary | ICD-10-CM | POA: Diagnosis present

## 2020-01-19 DIAGNOSIS — D84821 Immunodeficiency due to drugs: Secondary | ICD-10-CM | POA: Diagnosis present

## 2020-01-19 DIAGNOSIS — K219 Gastro-esophageal reflux disease without esophagitis: Secondary | ICD-10-CM | POA: Diagnosis present

## 2020-01-19 DIAGNOSIS — S79921A Unspecified injury of right thigh, initial encounter: Secondary | ICD-10-CM | POA: Diagnosis not present

## 2020-01-19 DIAGNOSIS — W1839XA Other fall on same level, initial encounter: Secondary | ICD-10-CM | POA: Diagnosis not present

## 2020-01-19 DIAGNOSIS — I4891 Unspecified atrial fibrillation: Secondary | ICD-10-CM | POA: Diagnosis present

## 2020-01-19 DIAGNOSIS — I130 Hypertensive heart and chronic kidney disease with heart failure and stage 1 through stage 4 chronic kidney disease, or unspecified chronic kidney disease: Secondary | ICD-10-CM | POA: Diagnosis present

## 2020-01-19 DIAGNOSIS — E871 Hypo-osmolality and hyponatremia: Secondary | ICD-10-CM | POA: Diagnosis present

## 2020-01-19 DIAGNOSIS — G8918 Other acute postprocedural pain: Secondary | ICD-10-CM | POA: Diagnosis not present

## 2020-01-19 DIAGNOSIS — Z8679 Personal history of other diseases of the circulatory system: Secondary | ICD-10-CM | POA: Diagnosis not present

## 2020-01-19 DIAGNOSIS — K5900 Constipation, unspecified: Secondary | ICD-10-CM | POA: Diagnosis present

## 2020-01-19 DIAGNOSIS — J9621 Acute and chronic respiratory failure with hypoxia: Secondary | ICD-10-CM | POA: Diagnosis present

## 2020-01-19 DIAGNOSIS — N189 Chronic kidney disease, unspecified: Secondary | ICD-10-CM | POA: Diagnosis not present

## 2020-01-19 DIAGNOSIS — S72011D Unspecified intracapsular fracture of right femur, subsequent encounter for closed fracture with routine healing: Secondary | ICD-10-CM | POA: Diagnosis not present

## 2020-01-19 DIAGNOSIS — J449 Chronic obstructive pulmonary disease, unspecified: Secondary | ICD-10-CM | POA: Diagnosis present

## 2020-01-19 DIAGNOSIS — D899 Disorder involving the immune mechanism, unspecified: Secondary | ICD-10-CM | POA: Diagnosis not present

## 2020-01-19 DIAGNOSIS — I5022 Chronic systolic (congestive) heart failure: Secondary | ICD-10-CM | POA: Diagnosis present

## 2020-01-19 DIAGNOSIS — G9340 Encephalopathy, unspecified: Secondary | ICD-10-CM | POA: Diagnosis present

## 2020-01-19 DIAGNOSIS — E872 Acidosis: Secondary | ICD-10-CM | POA: Diagnosis present

## 2020-01-19 DIAGNOSIS — G4733 Obstructive sleep apnea (adult) (pediatric): Secondary | ICD-10-CM | POA: Diagnosis present

## 2020-01-19 DIAGNOSIS — M7989 Other specified soft tissue disorders: Secondary | ICD-10-CM | POA: Diagnosis not present

## 2020-01-19 DIAGNOSIS — N184 Chronic kidney disease, stage 4 (severe): Secondary | ICD-10-CM | POA: Diagnosis present

## 2020-01-19 DIAGNOSIS — E118 Type 2 diabetes mellitus with unspecified complications: Secondary | ICD-10-CM | POA: Diagnosis not present

## 2020-01-19 DIAGNOSIS — S72091A Other fracture of head and neck of right femur, initial encounter for closed fracture: Secondary | ICD-10-CM | POA: Diagnosis not present

## 2020-01-19 DIAGNOSIS — N401 Enlarged prostate with lower urinary tract symptoms: Secondary | ICD-10-CM | POA: Diagnosis present

## 2020-01-19 DIAGNOSIS — E785 Hyperlipidemia, unspecified: Secondary | ICD-10-CM | POA: Diagnosis present

## 2020-01-19 DIAGNOSIS — E1165 Type 2 diabetes mellitus with hyperglycemia: Secondary | ICD-10-CM | POA: Diagnosis not present

## 2020-01-19 DIAGNOSIS — E1129 Type 2 diabetes mellitus with other diabetic kidney complication: Secondary | ICD-10-CM | POA: Diagnosis not present

## 2020-01-19 DIAGNOSIS — S72044A Nondisplaced fracture of base of neck of right femur, initial encounter for closed fracture: Secondary | ICD-10-CM | POA: Diagnosis not present

## 2020-01-19 DIAGNOSIS — R042 Hemoptysis: Secondary | ICD-10-CM | POA: Diagnosis present

## 2020-01-19 DIAGNOSIS — Y33XXXA Other specified events, undetermined intent, initial encounter: Secondary | ICD-10-CM | POA: Diagnosis not present

## 2020-01-19 DIAGNOSIS — R739 Hyperglycemia, unspecified: Secondary | ICD-10-CM | POA: Diagnosis not present

## 2020-01-19 DIAGNOSIS — E0922 Drug or chemical induced diabetes mellitus with diabetic chronic kidney disease: Secondary | ICD-10-CM | POA: Diagnosis present

## 2020-01-19 DIAGNOSIS — E0965 Drug or chemical induced diabetes mellitus with hyperglycemia: Secondary | ICD-10-CM | POA: Diagnosis present

## 2020-01-19 DIAGNOSIS — M25461 Effusion, right knee: Secondary | ICD-10-CM | POA: Diagnosis not present

## 2020-01-19 DIAGNOSIS — I428 Other cardiomyopathies: Secondary | ICD-10-CM | POA: Diagnosis present

## 2020-01-19 DIAGNOSIS — T380X5A Adverse effect of glucocorticoids and synthetic analogues, initial encounter: Secondary | ICD-10-CM | POA: Diagnosis not present

## 2020-01-19 DIAGNOSIS — S72041A Displaced fracture of base of neck of right femur, initial encounter for closed fracture: Secondary | ICD-10-CM | POA: Diagnosis not present

## 2020-01-19 DIAGNOSIS — J189 Pneumonia, unspecified organism: Secondary | ICD-10-CM | POA: Diagnosis present

## 2020-01-19 DIAGNOSIS — S8991XA Unspecified injury of right lower leg, initial encounter: Secondary | ICD-10-CM | POA: Diagnosis not present

## 2020-01-19 DIAGNOSIS — Z942 Lung transplant status: Secondary | ICD-10-CM | POA: Diagnosis not present

## 2020-01-19 LAB — SARS CORONAVIRUS 2 BY RT PCR (HOSPITAL ORDER, PERFORMED IN ~~LOC~~ HOSPITAL LAB): SARS Coronavirus 2: NEGATIVE

## 2020-01-19 NOTE — Discharge Summary (Signed)
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 BPH (benign prostatic hyperplasia)   . Chest pain, unspecified   . Diabetes mellitus without complication ([place])   . Dyspnea   . Hyperlipidemia   . Hypertension     HOSPITAL COURSE:   1.  Acute hypoxic respiratory failure the patient initially was admitted to the hospital and then respiratory status worsened and required BiPAP.  He was tapered over to nasal cannula and using BiPAP at night.  He was currently on 3 L of oxygen. 2.  Atrial fibrillation with rapid ventricular response.  This occurred in the evening of 01/17/2020.  IV metoprolol was pushed then amiodarone push and drip was started.  Patient was seen by cardiology.  Switched over to oral amiodarone 400 mg p.o. twice daily and also on metoprolol. 3.  Right hip fracture.  Patient be transferred to [facility] for operative repair 4.  COPD exacerbation with multifocal pneumonia.  The patient was on Solu-Medrol nebulizer treatments and Maxipime.  Since MRSA PCR was negative vancomycin was discontinued.  I did switch over to prednisone. 5.  Acute kidney injury on  chronic kidney disease stage IIIb.  Creatinine elevated up to 5.  Recommend nephrology consultation at [place].  Patient has a Foley catheter in and putting out urine. 6.  Type 2 diabetes mellitus with hyperlipidemia on pravastatin.  Continue Levemir insulin.  Since decreasing the steroids can hopefully cut back on insulin coverage. 7.  Anemia of chronic disease 8.  BPH 9.  Chronic diastolic congestive heart failure  DISCHARGE CONDITIONS:   Fair  CONSULTS OBTAINED:  Treatment Team:  Hessie Knows, MD Pulmonary Cardiology DRUG ALLERGIES:   Allergies  Allergen Reactions  . 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    DISCHARGE MEDICATIONS:   Allergies as of 01/19/2020      Reactions   Nsaids Other (See Comments)   S/p lung transplant on prograf which in combination with NSAIDs can cause renal failure.      Medication List    STOP taking these medications   albuterol (2.5 MG/3ML) 0.083% nebulizer solution Commonly known as: PROVENTIL   albuterol 108 (90 Base) MCG/ACT inhaler Commonly known as: VENTOLIN HFA   azithromycin 250 MG tablet Commonly known as: ZITHROMAX   dapagliflozin propanediol 10 MG Tabs tablet Commonly known as: FARXIGA   Entresto 24-26 MG Generic drug: sacubitril-valsartan   Lancets 28G Misc   metoprolol succinate 50 MG 24 hr tablet Commonly known as: TOPROL-XL   NovoLIN N 100 UNIT/ML injection Generic drug: insulin NPH Human  NovoLIN R 100 units/mL injection Generic drug: insulin regular   OneTouch Verio test strip Generic drug: glucose blood   rosuvastatin 20 MG tablet Commonly known as: CRESTOR   sodium chloride HYPERTONIC 3 % nebulizer solution   torsemide 20 MG tablet Commonly known as: DEMADEX     TAKE these medications   acetaminophen 325 MG tablet Commonly known as: TYLENOL Take 650 mg by mouth every 6 (six) hours as needed.   amiodarone 400 MG  tablet Commonly known as: PACERONE Take 1 tablet (400 mg total) by mouth 2 (two) times daily.   budesonide-formoterol 160-4.5 MCG/ACT inhaler Commonly known as: SYMBICORT Inhale 2 puffs into the lungs 2 (two) times daily.   ceFEPIme 2 g in sodium chloride 0.9 % 100 mL Inject 2 g into the vein daily.   fexofenadine 180 MG tablet Commonly known as: ALLEGRA Take 180 mg by mouth daily.   heparin 25000-0.45 UT/250ML-% infusion Inject 1,100 Units/hr into the vein continuous.   insulin aspart 100 UNIT/ML injection Commonly known as: novoLOG Inject 4 Units into the skin 3 (three) times daily with meals.   insulin aspart 100 UNIT/ML injection Commonly known as: novoLOG Inject 6 Units into the skin 3 (three) times daily with meals.   insulin detemir 100 UNIT/ML injection Commonly known as: LEVEMIR Inject 0.07 mLs (7 Units total) into the skin 2 (two) times daily at 8 am and 10 pm.   ipratropium-albuterol 0.5-2.5 (3) MG/3ML Soln Commonly known as: DUONEB Take 3 mLs by nebulization 4 (four) times daily.   lovastatin 20 MG tablet Commonly known as: MEVACOR Take 1 tablet (20 mg total) by mouth daily.   melatonin 5 MG Tabs Commonly known as: Melatonin Maximum Strength Take 1 tablet (5 mg total) by mouth at bedtime as needed (sleep). What changed:   how much to take  when to take this  reasons to take this   metoprolol tartrate 25 MG tablet Commonly known as: LOPRESSOR Take 1 tablet (25 mg total) by mouth 2 (two) times daily. What changed:   how much to take  how to take this  when to take this  additional instructions   Multi-Vitamin tablet Take 1 tablet by mouth daily.   oxyCODONE 5 MG immediate release tablet Commonly known as: Oxy IR/ROXICODONE Take 1 tablet (5 mg total) by mouth every 6 (six) hours as needed for moderate pain.   pantoprazole 40 MG tablet Commonly known as: PROTONIX Take 40 mg by mouth 2 (two) times daily.   predniSONE 10 MG  tablet Commonly known as: DELTASONE Take 3 tablets (30 mg total) by mouth daily with breakfast. What changed:   medication strength  how much to take   Sirolimus 0.5 MG tablet Commonly known as: RAPAMUNE Take 1 mg by mouth daily.   sulfamethoxazole-trimethoprim 400-80 MG tablet Commonly known as: BACTRIM Take 1 tablet by mouth 3 (three) times a week.   tacrolimus 1 MG capsule Commonly known as: PROGRAF Take 1 mg by mouth 2 (two) times daily.   tamsulosin 0.4 MG Caps capsule Commonly known as: FLOMAX Take 0.4 mg by mouth daily after breakfast. 1 on saturdays   valGANciclovir 450 MG tablet Commonly known as: VALCYTE Take 450 mg by mouth every other day. What changed: Another medication with the same name was added. Make sure you understand how and when to take each.   valGANciclovir 450 MG tablet Commonly known as: VALCYTE Take 1 tablet (450 mg total) by mouth 2 (two) times a week. Start  taking on: January 20, 2020 What changed: You were already taking a medication with the same name, and this prescription was added. Make sure you understand how and when to take each.        DISCHARGE INSTRUCTIONS:  Follow-up with team at [facility] 1 day  If you experience worsening of your admission symptoms, develop shortness of breath, life threatening emergency, suicidal or homicidal thoughts you must seek medical attention immediately by calling 911 or calling your MD immediately  if symptoms less severe.  You Must read complete instructions/literature along with all the possible adverse reactions/side effects for all the Medicines you take and that have been prescribed to you. Take any new Medicines after you have completely understood and accept all the possible adverse reactions/side effects.   Please note  You were cared for by a hospitalist during your hospital stay. If you have any questions about your discharge medications or the care you received while you were in the hospital  after you are discharged, you can call the unit and asked to speak with the hospitalist on call if the hospitalist that took care of you is not available. Once you are discharged, your primary care physician will handle any further medical issues. Please note that NO REFILLS for any discharge medications will be authorized once you are discharged, as it is imperative that you return to your primary care physician (or establish a relationship with a primary care physician if you do not have one) for your aftercare needs so that they can reassess your need for medications and monitor your lab values.    Today   CHIEF COMPLAINT:   Chief Complaint  Patient presents with  . Shortness of Breath  . Loss of Consciousness    HISTORY OF PRESENT ILLNESS:  Caleb [name]  is a 74 y.o. male came in with shortness of breath and fall and found to have right hip fracture   VITAL SIGNS:  Blood pressure (!) 147/71, pulse 93, temperature 97.6 F (36.4 C), temperature source Oral, resp. rate (!) 25, height 5\' 9"  (1.753 m), weight 103.4 kg, SpO2 99 %.    DATA REVIEW:   CBC Recent Labs  Lab 01/17/20 2257  WBC 14.4*  HGB 9.7*  HCT 30.7*  PLT 163    Chemistries  Recent Labs  Lab 01/14/20 2117 01/16/20 0448 01/18/20 1125  NA 132*   < > 134*  K 4.3   < > 5.0  CL 97*   < > 99  CO2 23   < > 19*  GLUCOSE 261*   < > 327*  BUN 50*   < > 111*  CREATININE 3.01*   < > 5.06*  CALCIUM 8.1*   < > 8.6*  MG 2.1  --   --   AST 26  --   --   ALT 17  --   --   ALKPHOS 58  --   --   BILITOT 1.2  --   --    < > = values in this interval not displayed.     Microbiology Results  Results for orders placed or performed during the hospital encounter of 01/13/20  SARS Coronavirus 2 by RT PCR (hospital order, performed in [facility] hospital lab) Nasopharyngeal Nasopharyngeal Swab     Status: None   Collection Time: 01/13/20  8:12 PM   Specimen: Nasopharyngeal Swab  Result Value Ref Range Status    SARS Coronavirus 2 NEGATIVE NEGATIVE Final    Comment: (  NOTE) SARS-CoV-2 target nucleic acids are NOT DETECTED.  The SARS-CoV-2 RNA is generally detectable in upper and lower respiratory specimens during the acute phase of infection. The lowest concentration of SARS-CoV-2 viral copies this assay can detect is 250 copies / mL. A negative result does not preclude SARS-CoV-2 infection and should not be used as the sole basis for treatment or other patient management decisions.  A negative result may occur with improper specimen collection / handling, submission of specimen other than nasopharyngeal swab, presence of viral mutation(s) within the areas targeted by this assay, and inadequate number of viral copies (<250 copies / mL). A negative result must be combined with clinical observations, patient history, and epidemiological information.  Fact Sheet for Patients:   StrictlyIdeas.no  Fact Sheet for Healthcare Providers: BankingDealers.co.za  This test is not yet approved or  cleared by the [place] FDA and has been authorized for detection and/or diagnosis of SARS-CoV-2 by FDA under an Emergency Use Authorization (EUA).  This EUA will remain in effect (meaning this test can be used) for the duration of the COVID-19 declaration under Section 564(b)(1) of the Act, 21 U.S.C. section 360bbb-3(b)(1), unless the authorization is terminated or revoked sooner.  Performed at [facility], [address] [place], [place] [address]   MRSA PCR Screening     Status: None   Collection Time: 01/15/20  8:15 AM   Specimen: Nasal Mucosa; Nasopharyngeal  Result Value Ref Range Status   MRSA by PCR NEGATIVE NEGATIVE Final    Comment:        The GeneXpert MRSA Assay (FDA approved for NASAL specimens only), is one component of a comprehensive MRSA colonization surveillance program. It is not intended to diagnose MRSA infection nor  to guide or monitor treatment for MRSA infections. Performed at [facility], [address] [place], [place] [address]   SARS Coronavirus 2 by RT PCR (hospital order, performed in [facility] hospital lab) Nasopharyngeal Nasopharyngeal Swab     Status: None   Collection Time: 01/18/20 11:38 PM   Specimen: Nasopharyngeal Swab  Result Value Ref Range Status   SARS Coronavirus 2 NEGATIVE NEGATIVE Final    Comment: (NOTE) SARS-CoV-2 target nucleic acids are NOT DETECTED.  The SARS-CoV-2 RNA is generally detectable in upper and lower respiratory specimens during the acute phase of infection. The lowest concentration of SARS-CoV-2 viral copies this assay can detect is 250 copies / mL. A negative result does not preclude SARS-CoV-2 infection and should not be used as the sole basis for treatment or other patient management decisions.  A negative result may occur with improper specimen collection / handling, submission of specimen other than nasopharyngeal swab, presence of viral mutation(s) within the areas targeted by this assay, and inadequate number of viral copies (<250 copies / mL). A negative result must be combined with clinical observations, patient history, and epidemiological information.  Fact Sheet for Patients:   StrictlyIdeas.no  Fact Sheet for Healthcare Providers: BankingDealers.co.za  This test is not yet approved or  cleared by the [place] FDA and has been authorized for detection and/or diagnosis of SARS-CoV-2 by FDA under an Emergency Use Authorization (EUA).  This EUA will remain in effect (meaning this test can be used) for the duration of the COVID-19 declaration under Section 564(b)(1) of the Act, 21 U.S.C. section 360bbb-3(b)(1), unless the authorization is terminated or revoked sooner.  Performed at [facility], [address], [place], [place] [address]  CODE STATUS:  Code  Status History    Date Active Date Inactive Code Status Order ID Comments User Context   01/13/2020 2242 01/19/2020 0533 Full Code [id number]  Mansy, Arvella Merles, MD ED   Advance Care Planning Activity    Questions for Most Recent Historical Code Status (Order [id number])       TOTAL TIME TAKING CARE OF THIS PATIENT on 01/18/2020 was 27 minutes.    Loletha Grayer M.D on 01/19/2020 at 7:05 AM  Between 7am to 6pm - Pager - [phone]  After 6pm go to [email or web address] - password [email or web address]  [facility]  CC: Primary care physician; Lavera Guise, MD

## 2020-02-04 DIAGNOSIS — W1839XA Other fall on same level, initial encounter: Secondary | ICD-10-CM | POA: Diagnosis not present

## 2020-02-04 DIAGNOSIS — S72001D Fracture of unspecified part of neck of right femur, subsequent encounter for closed fracture with routine healing: Secondary | ICD-10-CM | POA: Diagnosis not present

## 2020-02-04 DIAGNOSIS — S72011A Unspecified intracapsular fracture of right femur, initial encounter for closed fracture: Secondary | ICD-10-CM | POA: Diagnosis not present

## 2020-02-04 DIAGNOSIS — M1611 Unilateral primary osteoarthritis, right hip: Secondary | ICD-10-CM | POA: Diagnosis not present

## 2020-02-04 DIAGNOSIS — Z87891 Personal history of nicotine dependence: Secondary | ICD-10-CM | POA: Diagnosis not present

## 2020-02-05 DIAGNOSIS — T86810 Lung transplant rejection: Secondary | ICD-10-CM | POA: Diagnosis not present

## 2020-02-05 DIAGNOSIS — W19XXXD Unspecified fall, subsequent encounter: Secondary | ICD-10-CM | POA: Diagnosis not present

## 2020-02-05 DIAGNOSIS — S72001D Fracture of unspecified part of neck of right femur, subsequent encounter for closed fracture with routine healing: Secondary | ICD-10-CM | POA: Diagnosis not present

## 2020-02-05 DIAGNOSIS — I130 Hypertensive heart and chronic kidney disease with heart failure and stage 1 through stage 4 chronic kidney disease, or unspecified chronic kidney disease: Secondary | ICD-10-CM | POA: Diagnosis not present

## 2020-02-05 DIAGNOSIS — D509 Iron deficiency anemia, unspecified: Secondary | ICD-10-CM | POA: Diagnosis not present

## 2020-02-05 DIAGNOSIS — I5022 Chronic systolic (congestive) heart failure: Secondary | ICD-10-CM | POA: Diagnosis not present

## 2020-02-05 DIAGNOSIS — Z87891 Personal history of nicotine dependence: Secondary | ICD-10-CM | POA: Diagnosis not present

## 2020-02-05 DIAGNOSIS — S72001A Fracture of unspecified part of neck of right femur, initial encounter for closed fracture: Secondary | ICD-10-CM | POA: Diagnosis not present

## 2020-02-05 DIAGNOSIS — N189 Chronic kidney disease, unspecified: Secondary | ICD-10-CM | POA: Diagnosis not present

## 2020-02-05 DIAGNOSIS — J849 Interstitial pulmonary disease, unspecified: Secondary | ICD-10-CM | POA: Diagnosis not present

## 2020-02-05 DIAGNOSIS — Z794 Long term (current) use of insulin: Secondary | ICD-10-CM | POA: Diagnosis not present

## 2020-02-05 DIAGNOSIS — G4733 Obstructive sleep apnea (adult) (pediatric): Secondary | ICD-10-CM | POA: Diagnosis not present

## 2020-02-05 DIAGNOSIS — E785 Hyperlipidemia, unspecified: Secondary | ICD-10-CM | POA: Diagnosis not present

## 2020-02-05 DIAGNOSIS — K219 Gastro-esophageal reflux disease without esophagitis: Secondary | ICD-10-CM | POA: Diagnosis not present

## 2020-02-05 DIAGNOSIS — N400 Enlarged prostate without lower urinary tract symptoms: Secondary | ICD-10-CM | POA: Diagnosis not present

## 2020-02-05 DIAGNOSIS — J8410 Pulmonary fibrosis, unspecified: Secondary | ICD-10-CM | POA: Diagnosis not present

## 2020-02-05 DIAGNOSIS — J449 Chronic obstructive pulmonary disease, unspecified: Secondary | ICD-10-CM | POA: Diagnosis not present

## 2020-02-05 DIAGNOSIS — Z942 Lung transplant status: Secondary | ICD-10-CM | POA: Diagnosis not present

## 2020-02-11 DIAGNOSIS — J449 Chronic obstructive pulmonary disease, unspecified: Secondary | ICD-10-CM | POA: Diagnosis not present

## 2020-02-11 DIAGNOSIS — N189 Chronic kidney disease, unspecified: Secondary | ICD-10-CM | POA: Diagnosis not present

## 2020-02-11 DIAGNOSIS — I130 Hypertensive heart and chronic kidney disease with heart failure and stage 1 through stage 4 chronic kidney disease, or unspecified chronic kidney disease: Secondary | ICD-10-CM | POA: Diagnosis not present

## 2020-02-11 DIAGNOSIS — I5022 Chronic systolic (congestive) heart failure: Secondary | ICD-10-CM | POA: Diagnosis not present

## 2020-02-11 DIAGNOSIS — S72001D Fracture of unspecified part of neck of right femur, subsequent encounter for closed fracture with routine healing: Secondary | ICD-10-CM | POA: Diagnosis not present

## 2020-02-11 DIAGNOSIS — W19XXXD Unspecified fall, subsequent encounter: Secondary | ICD-10-CM | POA: Diagnosis not present

## 2020-02-13 DIAGNOSIS — I5022 Chronic systolic (congestive) heart failure: Secondary | ICD-10-CM | POA: Diagnosis not present

## 2020-02-14 DIAGNOSIS — W19XXXD Unspecified fall, subsequent encounter: Secondary | ICD-10-CM | POA: Diagnosis not present

## 2020-02-14 DIAGNOSIS — S72001D Fracture of unspecified part of neck of right femur, subsequent encounter for closed fracture with routine healing: Secondary | ICD-10-CM | POA: Diagnosis not present

## 2020-02-14 DIAGNOSIS — I130 Hypertensive heart and chronic kidney disease with heart failure and stage 1 through stage 4 chronic kidney disease, or unspecified chronic kidney disease: Secondary | ICD-10-CM | POA: Diagnosis not present

## 2020-02-14 DIAGNOSIS — I5022 Chronic systolic (congestive) heart failure: Secondary | ICD-10-CM | POA: Diagnosis not present

## 2020-02-14 DIAGNOSIS — J449 Chronic obstructive pulmonary disease, unspecified: Secondary | ICD-10-CM | POA: Diagnosis not present

## 2020-02-14 DIAGNOSIS — N189 Chronic kidney disease, unspecified: Secondary | ICD-10-CM | POA: Diagnosis not present

## 2020-02-17 DIAGNOSIS — I5022 Chronic systolic (congestive) heart failure: Secondary | ICD-10-CM | POA: Diagnosis not present

## 2020-02-17 DIAGNOSIS — N189 Chronic kidney disease, unspecified: Secondary | ICD-10-CM | POA: Diagnosis not present

## 2020-02-17 DIAGNOSIS — S72001D Fracture of unspecified part of neck of right femur, subsequent encounter for closed fracture with routine healing: Secondary | ICD-10-CM | POA: Diagnosis not present

## 2020-02-17 DIAGNOSIS — I130 Hypertensive heart and chronic kidney disease with heart failure and stage 1 through stage 4 chronic kidney disease, or unspecified chronic kidney disease: Secondary | ICD-10-CM | POA: Diagnosis not present

## 2020-02-17 DIAGNOSIS — W19XXXD Unspecified fall, subsequent encounter: Secondary | ICD-10-CM | POA: Diagnosis not present

## 2020-02-17 DIAGNOSIS — J449 Chronic obstructive pulmonary disease, unspecified: Secondary | ICD-10-CM | POA: Diagnosis not present

## 2020-02-20 DIAGNOSIS — Z006 Encounter for examination for normal comparison and control in clinical research program: Secondary | ICD-10-CM | POA: Diagnosis not present

## 2020-02-20 DIAGNOSIS — J42 Unspecified chronic bronchitis: Secondary | ICD-10-CM | POA: Diagnosis not present

## 2020-02-20 DIAGNOSIS — Z942 Lung transplant status: Secondary | ICD-10-CM | POA: Diagnosis not present

## 2020-02-20 DIAGNOSIS — T86810 Lung transplant rejection: Secondary | ICD-10-CM | POA: Diagnosis not present

## 2020-02-21 DIAGNOSIS — T86810 Lung transplant rejection: Secondary | ICD-10-CM | POA: Diagnosis not present

## 2020-02-21 DIAGNOSIS — Z006 Encounter for examination for normal comparison and control in clinical research program: Secondary | ICD-10-CM | POA: Diagnosis not present

## 2020-02-21 DIAGNOSIS — J42 Unspecified chronic bronchitis: Secondary | ICD-10-CM | POA: Diagnosis not present

## 2020-02-25 DIAGNOSIS — J449 Chronic obstructive pulmonary disease, unspecified: Secondary | ICD-10-CM | POA: Diagnosis not present

## 2020-02-25 DIAGNOSIS — W19XXXD Unspecified fall, subsequent encounter: Secondary | ICD-10-CM | POA: Diagnosis not present

## 2020-02-25 DIAGNOSIS — I130 Hypertensive heart and chronic kidney disease with heart failure and stage 1 through stage 4 chronic kidney disease, or unspecified chronic kidney disease: Secondary | ICD-10-CM | POA: Diagnosis not present

## 2020-02-25 DIAGNOSIS — S72001D Fracture of unspecified part of neck of right femur, subsequent encounter for closed fracture with routine healing: Secondary | ICD-10-CM | POA: Diagnosis not present

## 2020-02-25 DIAGNOSIS — I5022 Chronic systolic (congestive) heart failure: Secondary | ICD-10-CM | POA: Diagnosis not present

## 2020-02-25 DIAGNOSIS — N189 Chronic kidney disease, unspecified: Secondary | ICD-10-CM | POA: Diagnosis not present

## 2020-02-27 ENCOUNTER — Ambulatory Visit: Payer: Medicare Other | Admitting: Hospice and Palliative Medicine

## 2020-02-27 DIAGNOSIS — N189 Chronic kidney disease, unspecified: Secondary | ICD-10-CM | POA: Diagnosis not present

## 2020-02-27 DIAGNOSIS — I5022 Chronic systolic (congestive) heart failure: Secondary | ICD-10-CM | POA: Diagnosis not present

## 2020-02-27 DIAGNOSIS — W19XXXD Unspecified fall, subsequent encounter: Secondary | ICD-10-CM | POA: Diagnosis not present

## 2020-02-27 DIAGNOSIS — S72001D Fracture of unspecified part of neck of right femur, subsequent encounter for closed fracture with routine healing: Secondary | ICD-10-CM | POA: Diagnosis not present

## 2020-02-27 DIAGNOSIS — I130 Hypertensive heart and chronic kidney disease with heart failure and stage 1 through stage 4 chronic kidney disease, or unspecified chronic kidney disease: Secondary | ICD-10-CM | POA: Diagnosis not present

## 2020-02-27 DIAGNOSIS — J449 Chronic obstructive pulmonary disease, unspecified: Secondary | ICD-10-CM | POA: Diagnosis not present

## 2020-02-28 DIAGNOSIS — I5022 Chronic systolic (congestive) heart failure: Secondary | ICD-10-CM | POA: Diagnosis not present

## 2020-02-28 DIAGNOSIS — I509 Heart failure, unspecified: Secondary | ICD-10-CM | POA: Diagnosis not present

## 2020-02-28 DIAGNOSIS — S72001D Fracture of unspecified part of neck of right femur, subsequent encounter for closed fracture with routine healing: Secondary | ICD-10-CM | POA: Diagnosis not present

## 2020-02-28 DIAGNOSIS — E876 Hypokalemia: Secondary | ICD-10-CM | POA: Diagnosis not present

## 2020-02-28 DIAGNOSIS — Z794 Long term (current) use of insulin: Secondary | ICD-10-CM | POA: Diagnosis not present

## 2020-02-28 DIAGNOSIS — W19XXXD Unspecified fall, subsequent encounter: Secondary | ICD-10-CM | POA: Diagnosis not present

## 2020-02-28 DIAGNOSIS — E871 Hypo-osmolality and hyponatremia: Secondary | ICD-10-CM | POA: Diagnosis not present

## 2020-02-28 DIAGNOSIS — J449 Chronic obstructive pulmonary disease, unspecified: Secondary | ICD-10-CM | POA: Diagnosis not present

## 2020-02-28 DIAGNOSIS — N189 Chronic kidney disease, unspecified: Secondary | ICD-10-CM | POA: Diagnosis not present

## 2020-02-28 DIAGNOSIS — I130 Hypertensive heart and chronic kidney disease with heart failure and stage 1 through stage 4 chronic kidney disease, or unspecified chronic kidney disease: Secondary | ICD-10-CM | POA: Diagnosis not present

## 2020-02-28 DIAGNOSIS — T380X5A Adverse effect of glucocorticoids and synthetic analogues, initial encounter: Secondary | ICD-10-CM | POA: Diagnosis not present

## 2020-02-28 DIAGNOSIS — E119 Type 2 diabetes mellitus without complications: Secondary | ICD-10-CM | POA: Diagnosis not present

## 2020-02-28 DIAGNOSIS — Z23 Encounter for immunization: Secondary | ICD-10-CM | POA: Diagnosis not present

## 2020-02-28 DIAGNOSIS — E1165 Type 2 diabetes mellitus with hyperglycemia: Secondary | ICD-10-CM | POA: Diagnosis not present

## 2020-03-03 DIAGNOSIS — E1122 Type 2 diabetes mellitus with diabetic chronic kidney disease: Secondary | ICD-10-CM | POA: Diagnosis not present

## 2020-03-03 DIAGNOSIS — Z950 Presence of cardiac pacemaker: Secondary | ICD-10-CM | POA: Diagnosis not present

## 2020-03-03 DIAGNOSIS — E785 Hyperlipidemia, unspecified: Secondary | ICD-10-CM | POA: Diagnosis not present

## 2020-03-03 DIAGNOSIS — G4733 Obstructive sleep apnea (adult) (pediatric): Secondary | ICD-10-CM | POA: Diagnosis not present

## 2020-03-03 DIAGNOSIS — J84112 Idiopathic pulmonary fibrosis: Secondary | ICD-10-CM | POA: Diagnosis not present

## 2020-03-03 DIAGNOSIS — N400 Enlarged prostate without lower urinary tract symptoms: Secondary | ICD-10-CM | POA: Diagnosis not present

## 2020-03-03 DIAGNOSIS — I2510 Atherosclerotic heart disease of native coronary artery without angina pectoris: Secondary | ICD-10-CM | POA: Diagnosis not present

## 2020-03-03 DIAGNOSIS — Z7901 Long term (current) use of anticoagulants: Secondary | ICD-10-CM | POA: Diagnosis not present

## 2020-03-03 DIAGNOSIS — D84821 Immunodeficiency due to drugs: Secondary | ICD-10-CM | POA: Diagnosis not present

## 2020-03-03 DIAGNOSIS — Z7952 Long term (current) use of systemic steroids: Secondary | ICD-10-CM | POA: Diagnosis not present

## 2020-03-03 DIAGNOSIS — R059 Cough, unspecified: Secondary | ICD-10-CM | POA: Diagnosis not present

## 2020-03-03 DIAGNOSIS — Z5181 Encounter for therapeutic drug level monitoring: Secondary | ICD-10-CM | POA: Diagnosis not present

## 2020-03-03 DIAGNOSIS — Z942 Lung transplant status: Secondary | ICD-10-CM | POA: Diagnosis not present

## 2020-03-03 DIAGNOSIS — R0609 Other forms of dyspnea: Secondary | ICD-10-CM | POA: Diagnosis not present

## 2020-03-03 DIAGNOSIS — M6080 Other myositis, unspecified site: Secondary | ICD-10-CM | POA: Diagnosis not present

## 2020-03-03 DIAGNOSIS — T86818 Other complications of lung transplant: Secondary | ICD-10-CM | POA: Diagnosis not present

## 2020-03-03 DIAGNOSIS — I5020 Unspecified systolic (congestive) heart failure: Secondary | ICD-10-CM | POA: Diagnosis not present

## 2020-03-03 DIAGNOSIS — N184 Chronic kidney disease, stage 4 (severe): Secondary | ICD-10-CM | POA: Diagnosis not present

## 2020-03-03 DIAGNOSIS — Z79899 Other long term (current) drug therapy: Secondary | ICD-10-CM | POA: Diagnosis not present

## 2020-03-03 DIAGNOSIS — D849 Immunodeficiency, unspecified: Secondary | ICD-10-CM | POA: Diagnosis not present

## 2020-03-03 DIAGNOSIS — T86810 Lung transplant rejection: Secondary | ICD-10-CM | POA: Diagnosis not present

## 2020-03-03 DIAGNOSIS — M47814 Spondylosis without myelopathy or radiculopathy, thoracic region: Secondary | ICD-10-CM | POA: Diagnosis not present

## 2020-03-04 DIAGNOSIS — J449 Chronic obstructive pulmonary disease, unspecified: Secondary | ICD-10-CM | POA: Diagnosis not present

## 2020-03-04 DIAGNOSIS — N189 Chronic kidney disease, unspecified: Secondary | ICD-10-CM | POA: Diagnosis not present

## 2020-03-04 DIAGNOSIS — W19XXXD Unspecified fall, subsequent encounter: Secondary | ICD-10-CM | POA: Diagnosis not present

## 2020-03-04 DIAGNOSIS — S72001D Fracture of unspecified part of neck of right femur, subsequent encounter for closed fracture with routine healing: Secondary | ICD-10-CM | POA: Diagnosis not present

## 2020-03-04 DIAGNOSIS — I130 Hypertensive heart and chronic kidney disease with heart failure and stage 1 through stage 4 chronic kidney disease, or unspecified chronic kidney disease: Secondary | ICD-10-CM | POA: Diagnosis not present

## 2020-03-04 DIAGNOSIS — I5022 Chronic systolic (congestive) heart failure: Secondary | ICD-10-CM | POA: Diagnosis not present

## 2020-03-05 DIAGNOSIS — Z794 Long term (current) use of insulin: Secondary | ICD-10-CM | POA: Diagnosis not present

## 2020-03-05 DIAGNOSIS — I5022 Chronic systolic (congestive) heart failure: Secondary | ICD-10-CM | POA: Diagnosis not present

## 2020-03-05 DIAGNOSIS — E119 Type 2 diabetes mellitus without complications: Secondary | ICD-10-CM | POA: Diagnosis not present

## 2020-03-05 DIAGNOSIS — Z7952 Long term (current) use of systemic steroids: Secondary | ICD-10-CM | POA: Diagnosis not present

## 2020-03-05 DIAGNOSIS — I10 Essential (primary) hypertension: Secondary | ICD-10-CM | POA: Diagnosis not present

## 2020-03-05 DIAGNOSIS — Z942 Lung transplant status: Secondary | ICD-10-CM | POA: Diagnosis not present

## 2020-03-05 DIAGNOSIS — M47814 Spondylosis without myelopathy or radiculopathy, thoracic region: Secondary | ICD-10-CM | POA: Diagnosis not present

## 2020-03-05 DIAGNOSIS — J9811 Atelectasis: Secondary | ICD-10-CM | POA: Diagnosis not present

## 2020-03-05 DIAGNOSIS — R918 Other nonspecific abnormal finding of lung field: Secondary | ICD-10-CM | POA: Diagnosis not present

## 2020-03-05 DIAGNOSIS — Z01818 Encounter for other preprocedural examination: Secondary | ICD-10-CM | POA: Diagnosis not present

## 2020-03-05 DIAGNOSIS — Z79899 Other long term (current) drug therapy: Secondary | ICD-10-CM | POA: Diagnosis not present

## 2020-03-05 DIAGNOSIS — R058 Other specified cough: Secondary | ICD-10-CM | POA: Diagnosis not present

## 2020-03-05 DIAGNOSIS — N289 Disorder of kidney and ureter, unspecified: Secondary | ICD-10-CM | POA: Diagnosis not present

## 2020-03-05 DIAGNOSIS — R059 Cough, unspecified: Secondary | ICD-10-CM | POA: Diagnosis not present

## 2020-03-05 DIAGNOSIS — Z87891 Personal history of nicotine dependence: Secondary | ICD-10-CM | POA: Diagnosis not present

## 2020-03-05 DIAGNOSIS — I110 Hypertensive heart disease with heart failure: Secondary | ICD-10-CM | POA: Diagnosis not present

## 2020-03-05 DIAGNOSIS — K219 Gastro-esophageal reflux disease without esophagitis: Secondary | ICD-10-CM | POA: Diagnosis not present

## 2020-03-05 DIAGNOSIS — Z95810 Presence of automatic (implantable) cardiac defibrillator: Secondary | ICD-10-CM | POA: Diagnosis not present

## 2020-03-05 DIAGNOSIS — Z5181 Encounter for therapeutic drug level monitoring: Secondary | ICD-10-CM | POA: Diagnosis not present

## 2020-03-05 DIAGNOSIS — T86810 Lung transplant rejection: Secondary | ICD-10-CM | POA: Diagnosis not present

## 2020-03-05 DIAGNOSIS — R0602 Shortness of breath: Secondary | ICD-10-CM | POA: Diagnosis not present

## 2020-03-06 DIAGNOSIS — Z87891 Personal history of nicotine dependence: Secondary | ICD-10-CM | POA: Diagnosis not present

## 2020-03-06 DIAGNOSIS — J849 Interstitial pulmonary disease, unspecified: Secondary | ICD-10-CM | POA: Diagnosis not present

## 2020-03-06 DIAGNOSIS — K219 Gastro-esophageal reflux disease without esophagitis: Secondary | ICD-10-CM | POA: Diagnosis not present

## 2020-03-06 DIAGNOSIS — Z942 Lung transplant status: Secondary | ICD-10-CM | POA: Diagnosis not present

## 2020-03-06 DIAGNOSIS — D509 Iron deficiency anemia, unspecified: Secondary | ICD-10-CM | POA: Diagnosis not present

## 2020-03-06 DIAGNOSIS — J8410 Pulmonary fibrosis, unspecified: Secondary | ICD-10-CM | POA: Diagnosis not present

## 2020-03-06 DIAGNOSIS — J449 Chronic obstructive pulmonary disease, unspecified: Secondary | ICD-10-CM | POA: Diagnosis not present

## 2020-03-06 DIAGNOSIS — I5022 Chronic systolic (congestive) heart failure: Secondary | ICD-10-CM | POA: Diagnosis not present

## 2020-03-06 DIAGNOSIS — T86810 Lung transplant rejection: Secondary | ICD-10-CM | POA: Diagnosis not present

## 2020-03-06 DIAGNOSIS — Z794 Long term (current) use of insulin: Secondary | ICD-10-CM | POA: Diagnosis not present

## 2020-03-06 DIAGNOSIS — S72001D Fracture of unspecified part of neck of right femur, subsequent encounter for closed fracture with routine healing: Secondary | ICD-10-CM | POA: Diagnosis not present

## 2020-03-06 DIAGNOSIS — I130 Hypertensive heart and chronic kidney disease with heart failure and stage 1 through stage 4 chronic kidney disease, or unspecified chronic kidney disease: Secondary | ICD-10-CM | POA: Diagnosis not present

## 2020-03-06 DIAGNOSIS — N400 Enlarged prostate without lower urinary tract symptoms: Secondary | ICD-10-CM | POA: Diagnosis not present

## 2020-03-06 DIAGNOSIS — W19XXXD Unspecified fall, subsequent encounter: Secondary | ICD-10-CM | POA: Diagnosis not present

## 2020-03-06 DIAGNOSIS — N189 Chronic kidney disease, unspecified: Secondary | ICD-10-CM | POA: Diagnosis not present

## 2020-03-06 DIAGNOSIS — G4733 Obstructive sleep apnea (adult) (pediatric): Secondary | ICD-10-CM | POA: Diagnosis not present

## 2020-03-06 DIAGNOSIS — E785 Hyperlipidemia, unspecified: Secondary | ICD-10-CM | POA: Diagnosis not present

## 2020-03-10 DIAGNOSIS — Z7901 Long term (current) use of anticoagulants: Secondary | ICD-10-CM | POA: Diagnosis not present

## 2020-03-10 DIAGNOSIS — T86819 Unspecified complication of lung transplant: Secondary | ICD-10-CM | POA: Diagnosis not present

## 2020-03-10 DIAGNOSIS — D84821 Immunodeficiency due to drugs: Secondary | ICD-10-CM | POA: Diagnosis not present

## 2020-03-10 DIAGNOSIS — Z4824 Encounter for aftercare following lung transplant: Secondary | ICD-10-CM | POA: Diagnosis not present

## 2020-03-10 DIAGNOSIS — I447 Left bundle-branch block, unspecified: Secondary | ICD-10-CM | POA: Diagnosis not present

## 2020-03-10 DIAGNOSIS — Z794 Long term (current) use of insulin: Secondary | ICD-10-CM | POA: Diagnosis not present

## 2020-03-10 DIAGNOSIS — R0602 Shortness of breath: Secondary | ICD-10-CM | POA: Diagnosis not present

## 2020-03-10 DIAGNOSIS — Z7951 Long term (current) use of inhaled steroids: Secondary | ICD-10-CM | POA: Diagnosis not present

## 2020-03-10 DIAGNOSIS — J40 Bronchitis, not specified as acute or chronic: Secondary | ICD-10-CM | POA: Diagnosis not present

## 2020-03-10 DIAGNOSIS — Z79899 Other long term (current) drug therapy: Secondary | ICD-10-CM | POA: Diagnosis not present

## 2020-03-10 DIAGNOSIS — I10 Essential (primary) hypertension: Secondary | ICD-10-CM | POA: Diagnosis not present

## 2020-03-10 DIAGNOSIS — I272 Pulmonary hypertension, unspecified: Secondary | ICD-10-CM | POA: Diagnosis not present

## 2020-03-10 DIAGNOSIS — I5020 Unspecified systolic (congestive) heart failure: Secondary | ICD-10-CM | POA: Diagnosis not present

## 2020-03-10 DIAGNOSIS — G4733 Obstructive sleep apnea (adult) (pediatric): Secondary | ICD-10-CM | POA: Diagnosis not present

## 2020-03-10 DIAGNOSIS — Z20822 Contact with and (suspected) exposure to covid-19: Secondary | ICD-10-CM | POA: Diagnosis not present

## 2020-03-10 DIAGNOSIS — R059 Cough, unspecified: Secondary | ICD-10-CM | POA: Diagnosis not present

## 2020-03-10 DIAGNOSIS — R0609 Other forms of dyspnea: Secondary | ICD-10-CM | POA: Diagnosis not present

## 2020-03-10 DIAGNOSIS — I130 Hypertensive heart and chronic kidney disease with heart failure and stage 1 through stage 4 chronic kidney disease, or unspecified chronic kidney disease: Secondary | ICD-10-CM | POA: Diagnosis not present

## 2020-03-10 DIAGNOSIS — Z8709 Personal history of other diseases of the respiratory system: Secondary | ICD-10-CM | POA: Diagnosis not present

## 2020-03-10 DIAGNOSIS — Z87891 Personal history of nicotine dependence: Secondary | ICD-10-CM | POA: Diagnosis not present

## 2020-03-10 DIAGNOSIS — K219 Gastro-esophageal reflux disease without esophagitis: Secondary | ICD-10-CM | POA: Diagnosis not present

## 2020-03-10 DIAGNOSIS — E1122 Type 2 diabetes mellitus with diabetic chronic kidney disease: Secondary | ICD-10-CM | POA: Diagnosis not present

## 2020-03-10 DIAGNOSIS — Z942 Lung transplant status: Secondary | ICD-10-CM | POA: Diagnosis not present

## 2020-03-10 DIAGNOSIS — I428 Other cardiomyopathies: Secondary | ICD-10-CM | POA: Diagnosis not present

## 2020-03-10 DIAGNOSIS — Z7952 Long term (current) use of systemic steroids: Secondary | ICD-10-CM | POA: Diagnosis not present

## 2020-03-10 DIAGNOSIS — R942 Abnormal results of pulmonary function studies: Secondary | ICD-10-CM | POA: Diagnosis not present

## 2020-03-10 DIAGNOSIS — T86818 Other complications of lung transplant: Secondary | ICD-10-CM | POA: Diagnosis not present

## 2020-03-10 DIAGNOSIS — N184 Chronic kidney disease, stage 4 (severe): Secondary | ICD-10-CM | POA: Diagnosis not present

## 2020-03-11 DIAGNOSIS — W19XXXD Unspecified fall, subsequent encounter: Secondary | ICD-10-CM | POA: Diagnosis not present

## 2020-03-11 DIAGNOSIS — N189 Chronic kidney disease, unspecified: Secondary | ICD-10-CM | POA: Diagnosis not present

## 2020-03-11 DIAGNOSIS — S72001D Fracture of unspecified part of neck of right femur, subsequent encounter for closed fracture with routine healing: Secondary | ICD-10-CM | POA: Diagnosis not present

## 2020-03-11 DIAGNOSIS — J449 Chronic obstructive pulmonary disease, unspecified: Secondary | ICD-10-CM | POA: Diagnosis not present

## 2020-03-11 DIAGNOSIS — I130 Hypertensive heart and chronic kidney disease with heart failure and stage 1 through stage 4 chronic kidney disease, or unspecified chronic kidney disease: Secondary | ICD-10-CM | POA: Diagnosis not present

## 2020-03-11 DIAGNOSIS — I5022 Chronic systolic (congestive) heart failure: Secondary | ICD-10-CM | POA: Diagnosis not present

## 2020-03-17 DIAGNOSIS — S72001D Fracture of unspecified part of neck of right femur, subsequent encounter for closed fracture with routine healing: Secondary | ICD-10-CM | POA: Diagnosis not present

## 2020-03-17 DIAGNOSIS — W19XXXD Unspecified fall, subsequent encounter: Secondary | ICD-10-CM | POA: Diagnosis not present

## 2020-03-17 DIAGNOSIS — J449 Chronic obstructive pulmonary disease, unspecified: Secondary | ICD-10-CM | POA: Diagnosis not present

## 2020-03-17 DIAGNOSIS — N189 Chronic kidney disease, unspecified: Secondary | ICD-10-CM | POA: Diagnosis not present

## 2020-03-17 DIAGNOSIS — I130 Hypertensive heart and chronic kidney disease with heart failure and stage 1 through stage 4 chronic kidney disease, or unspecified chronic kidney disease: Secondary | ICD-10-CM | POA: Diagnosis not present

## 2020-03-17 DIAGNOSIS — I5022 Chronic systolic (congestive) heart failure: Secondary | ICD-10-CM | POA: Diagnosis not present

## 2020-03-19 DIAGNOSIS — I129 Hypertensive chronic kidney disease with stage 1 through stage 4 chronic kidney disease, or unspecified chronic kidney disease: Secondary | ICD-10-CM | POA: Diagnosis not present

## 2020-03-19 DIAGNOSIS — J849 Interstitial pulmonary disease, unspecified: Secondary | ICD-10-CM | POA: Diagnosis not present

## 2020-03-19 DIAGNOSIS — Z79899 Other long term (current) drug therapy: Secondary | ICD-10-CM | POA: Diagnosis not present

## 2020-03-19 DIAGNOSIS — I447 Left bundle-branch block, unspecified: Secondary | ICD-10-CM | POA: Diagnosis not present

## 2020-03-19 DIAGNOSIS — E1122 Type 2 diabetes mellitus with diabetic chronic kidney disease: Secondary | ICD-10-CM | POA: Diagnosis not present

## 2020-03-19 DIAGNOSIS — Z23 Encounter for immunization: Secondary | ICD-10-CM | POA: Diagnosis not present

## 2020-03-19 DIAGNOSIS — I428 Other cardiomyopathies: Secondary | ICD-10-CM | POA: Diagnosis not present

## 2020-03-19 DIAGNOSIS — G4733 Obstructive sleep apnea (adult) (pediatric): Secondary | ICD-10-CM | POA: Diagnosis not present

## 2020-03-19 DIAGNOSIS — Z942 Lung transplant status: Secondary | ICD-10-CM | POA: Diagnosis not present

## 2020-03-19 DIAGNOSIS — I5022 Chronic systolic (congestive) heart failure: Secondary | ICD-10-CM | POA: Diagnosis not present

## 2020-03-19 DIAGNOSIS — Z006 Encounter for examination for normal comparison and control in clinical research program: Secondary | ICD-10-CM | POA: Diagnosis not present

## 2020-03-19 DIAGNOSIS — T86810 Lung transplant rejection: Secondary | ICD-10-CM | POA: Diagnosis not present

## 2020-03-19 DIAGNOSIS — M6080 Other myositis, unspecified site: Secondary | ICD-10-CM | POA: Diagnosis not present

## 2020-03-19 DIAGNOSIS — E785 Hyperlipidemia, unspecified: Secondary | ICD-10-CM | POA: Diagnosis not present

## 2020-03-19 DIAGNOSIS — N189 Chronic kidney disease, unspecified: Secondary | ICD-10-CM | POA: Diagnosis not present

## 2020-03-20 DIAGNOSIS — T86810 Lung transplant rejection: Secondary | ICD-10-CM | POA: Diagnosis not present

## 2020-03-20 DIAGNOSIS — Z942 Lung transplant status: Secondary | ICD-10-CM | POA: Diagnosis not present

## 2020-03-20 DIAGNOSIS — J42 Unspecified chronic bronchitis: Secondary | ICD-10-CM | POA: Diagnosis not present

## 2020-03-20 DIAGNOSIS — Z006 Encounter for examination for normal comparison and control in clinical research program: Secondary | ICD-10-CM | POA: Diagnosis not present

## 2020-03-24 DIAGNOSIS — I5022 Chronic systolic (congestive) heart failure: Secondary | ICD-10-CM | POA: Diagnosis not present

## 2020-03-24 DIAGNOSIS — N189 Chronic kidney disease, unspecified: Secondary | ICD-10-CM | POA: Diagnosis not present

## 2020-03-24 DIAGNOSIS — J449 Chronic obstructive pulmonary disease, unspecified: Secondary | ICD-10-CM | POA: Diagnosis not present

## 2020-03-24 DIAGNOSIS — W19XXXD Unspecified fall, subsequent encounter: Secondary | ICD-10-CM | POA: Diagnosis not present

## 2020-03-24 DIAGNOSIS — S72001D Fracture of unspecified part of neck of right femur, subsequent encounter for closed fracture with routine healing: Secondary | ICD-10-CM | POA: Diagnosis not present

## 2020-03-24 DIAGNOSIS — I130 Hypertensive heart and chronic kidney disease with heart failure and stage 1 through stage 4 chronic kidney disease, or unspecified chronic kidney disease: Secondary | ICD-10-CM | POA: Diagnosis not present

## 2020-03-26 ENCOUNTER — Other Ambulatory Visit: Payer: Self-pay

## 2020-03-26 ENCOUNTER — Ambulatory Visit: Payer: Medicare Other | Admitting: Adult Health

## 2020-03-26 ENCOUNTER — Ambulatory Visit (INDEPENDENT_AMBULATORY_CARE_PROVIDER_SITE_OTHER): Payer: Medicare Other | Admitting: Hospice and Palliative Medicine

## 2020-03-26 ENCOUNTER — Encounter: Payer: Self-pay | Admitting: Hospice and Palliative Medicine

## 2020-03-26 DIAGNOSIS — E785 Hyperlipidemia, unspecified: Secondary | ICD-10-CM

## 2020-03-26 DIAGNOSIS — I5022 Chronic systolic (congestive) heart failure: Secondary | ICD-10-CM | POA: Diagnosis not present

## 2020-03-26 DIAGNOSIS — Z0001 Encounter for general adult medical examination with abnormal findings: Secondary | ICD-10-CM

## 2020-03-26 DIAGNOSIS — T86811 Lung transplant failure: Secondary | ICD-10-CM

## 2020-03-26 DIAGNOSIS — I10 Essential (primary) hypertension: Secondary | ICD-10-CM

## 2020-03-26 DIAGNOSIS — E1165 Type 2 diabetes mellitus with hyperglycemia: Secondary | ICD-10-CM | POA: Diagnosis not present

## 2020-03-26 DIAGNOSIS — L989 Disorder of the skin and subcutaneous tissue, unspecified: Secondary | ICD-10-CM | POA: Diagnosis not present

## 2020-03-26 NOTE — Progress Notes (Signed)
[facility] [place], [place] [address]  Internal MEDICINE  Office Visit Note  Patient Name: Caleb [name]  [id number]  [id number]  Date of Service: 03/30/2020  Chief Complaint  Patient presents with  . Medicare Wellness  . Hypertension  . Diabetes  . Hyperlipidemia  . other    need referral for dermatology  DR meyers     HPI Pt is here for routine health maintenance examination Followed closely by multiple providers at [facility] for chronic rejection of lung transplant-Initial transplant 2016, currently participating in clinical trial Most recent hospitalization 12/2019 for right hip fracture requiring surgical intervention Followed by endocrinology for diabetes Followed by cardiology for chronic systolic heart failure--EF 25%, moderate LVH, moderate RV systolic dysfunction, valvular regurgitation, has defibrillator  Had eye exam this year as well as foot exam Complaint today is that he would like a referral to dermatology for skin lesions that have appeared on his scalp--must be a dermatologist that specializes in lung transplant patients   Current Medication: Outpatient Encounter Medications as of 03/26/2020  Medication Sig Note  . acetaminophen (TYLENOL) 325 MG tablet Take 650 mg by mouth every 6 (six) hours as needed.   Marland Kitchen albuterol (VENTOLIN HFA) 108 (90 Base) MCG/ACT inhaler Inhale 2 puffs into the lungs every 6 (six) hours as needed for wheezing or shortness of breath.   Marland Kitchen azithromycin (ZITHROMAX) 250 MG tablet Take by mouth. Take 1 tab by po 3 times a week   . calcium acetate, Phos Binder, (PHOSLYRA) 667 MG/5ML SOLN Take 1,334 mg by mouth 3 (three) times daily with meals.   . Cholecalciferol (VITAMIN D3) 125 MCG (5000 UT) TBDP Take by mouth.   . finasteride (PROSCAR) 5 MG tablet Take 5 mg by mouth daily.   Marland Kitchen glucagon 1 MG injection For emergency use   . glucose blood (PRECISION QID TEST) test strip 1 each (1 strip total) by XX route 4 (four)  times daily Product selection permitted according to insurance preference. E11.9 Type 2 diabetes mellitus   . insulin regular (NOVOLIN R) 100 units/mL injection 17 units with breakfast, 15 units with lunch, 6 units with dinner PLUS sliding scale   . Insulin Syringe-Needle U-100 (BD VEO INSULIN SYRINGE U/F) 31G X 15/64" 0.3 ML MISC Use 1 Syringe 4 (four) times daily   . Magnesium 200 MG TABS Take by mouth. Take 1 tab po daily   . metoprolol succinate (TOPROL-XL) 50 MG 24 hr tablet Take 50 mg by mouth daily. Take with or immediately following a meal.   . Multiple Vitamin (MULTI-VITAMIN) tablet Take 1 tablet by mouth daily.   . mycophenolate (CELLCEPT) 250 MG capsule Take by mouth 2 (two) times daily.   . pantoprazole (PROTONIX) 40 MG tablet Take 40 mg by mouth 2 (two) times daily.    . polyethylene glycol (MIRALAX / GLYCOLAX) 17 g packet Take 17 g by mouth daily.   . predniSONE (DELTASONE) 5 MG tablet Take 5 mg by mouth daily with breakfast.   . Rivaroxaban (XARELTO) 15 MG TABS tablet Take 15 mg by mouth daily with supper.   . rosuvastatin (CRESTOR) 10 MG tablet Take 10 mg by mouth daily.   Marland Kitchen sulfamethoxazole-trimethoprim (BACTRIM) 400-80 MG tablet Take 1 tablet by mouth 3 (three) times a week.   . tacrolimus (PROGRAF) 0.5 MG capsule Take in combination with $RemoveBeforeDE'1mg'saOnQSQITZeKSEH$  capsules for total dose of $Remov'1mg'fEeIIh$  by mouth daily and 1.$RemoveBef'5mg'UvHhzYzFks$  by mouth nightly.   . tacrolimus (PROGRAF) 1 MG capsule  Take 1 mg by mouth 2 (two) times daily.  01/14/2020: Takes $RemoveBeforeDE'1mg'BqNvrRBqmCznAkB$  PO BID  . tamsulosin (FLOMAX) 0.4 MG CAPS Take 0.4 mg by mouth daily after breakfast. 1 on saturdays   . torsemide (DEMADEX) 20 MG tablet Take $RemoveBef'60mg'fjSYsTHagi$  by mouth twice daily x 3 days (9/3-9/5), then decrease to $RemoveBef'40mg'XLtxxLjrqo$  by mouth twice daily.  Take additional $RemoveBeforeD'40mg'hEXvIDuxhwkORq$  by mouth if weight gain increases by 2 lbs in 1 day or 5 lbs in 1 week.   . valGANciclovir (VALCYTE) 450 MG tablet Take 450 mg by mouth every other day. Take every Monday and thursday 01/13/2020: Due today, takes in the  evening every other day  . [DISCONTINUED] Glucagon, rDNA, (GLUCAGON EMERGENCY) 1 MG KIT Inject as directed.   . [DISCONTINUED] insulin NPH Human (NOVOLIN N) 100 UNIT/ML injection Inject into the skin. Inject 17 units  With breakfast,15 units with lunch and 6 units  With dinner with sliding scale   . [DISCONTINUED] predniSONE (DELTASONE) 10 MG tablet Take 3 tablets (30 mg total) by mouth daily with breakfast.   . budesonide-formoterol (SYMBICORT) 160-4.5 MCG/ACT inhaler Inhale 2 puffs into the lungs 2 (two) times daily.    . [DISCONTINUED] amiodarone (PACERONE) 400 MG tablet Take 1 tablet (400 mg total) by mouth 2 (two) times daily. (Patient not taking: Reported on 03/26/2020)   . [DISCONTINUED] ceFEPIme 2 g in sodium chloride 0.9 % 100 mL Inject 2 g into the vein daily. (Patient not taking: Reported on 03/26/2020)   . [DISCONTINUED] fexofenadine (ALLEGRA) 180 MG tablet Take 180 mg by mouth daily. (Patient not taking: Reported on 03/26/2020)   . [DISCONTINUED] heparin 25000-0.45 UT/250ML-% infusion Inject 1,100 Units/hr into the vein continuous. (Patient not taking: Reported on 03/26/2020)   . [DISCONTINUED] insulin aspart (NOVOLOG) 100 UNIT/ML injection Inject 4 Units into the skin 3 (three) times daily with meals. (Patient not taking: Reported on 03/26/2020)   . [DISCONTINUED] insulin aspart (NOVOLOG) 100 UNIT/ML injection Inject 6 Units into the skin 3 (three) times daily with meals. (Patient not taking: Reported on 03/26/2020)   . [DISCONTINUED] insulin detemir (LEVEMIR) 100 UNIT/ML injection Inject 0.07 mLs (7 Units total) into the skin 2 (two) times daily at 8 am and 10 pm. (Patient not taking: Reported on 03/26/2020)   . [DISCONTINUED] ipratropium-albuterol (DUONEB) 0.5-2.5 (3) MG/3ML SOLN Take 3 mLs by nebulization 4 (four) times daily. (Patient not taking: Reported on 03/26/2020)   . [DISCONTINUED] lovastatin (MEVACOR) 20 MG tablet Take 1 tablet (20 mg total) by mouth daily. (Patient not taking:  Reported on 03/26/2020)   . [DISCONTINUED] melatonin (MELATONIN MAXIMUM STRENGTH) 5 MG TABS Take 1 tablet (5 mg total) by mouth at bedtime as needed (sleep). (Patient not taking: Reported on 03/26/2020)   . [DISCONTINUED] metoprolol tartrate (LOPRESSOR) 25 MG tablet Take 1 tablet (25 mg total) by mouth 2 (two) times daily. (Patient taking differently: Take 50 mg by mouth 2 (two) times daily. )   . [DISCONTINUED] oxyCODONE (OXY IR/ROXICODONE) 5 MG immediate release tablet Take 1 tablet (5 mg total) by mouth every 6 (six) hours as needed for moderate pain. (Patient not taking: Reported on 03/26/2020)   . [DISCONTINUED] Sirolimus (RAPAMUNE) 0.5 MG tablet Take 1 mg by mouth daily.  (Patient not taking: Reported on 03/26/2020)   . [DISCONTINUED] valGANciclovir (VALCYTE) 450 MG tablet Take 1 tablet (450 mg total) by mouth 2 (two) times a week. (Patient not taking: Reported on 03/26/2020)    No facility-administered encounter medications on file as of 03/26/2020.  Surgical History: Past Surgical History:  Procedure Laterality Date  . LUNG TRANSPLANT      Medical History: Past Medical History:  Diagnosis Date  . BPH (benign prostatic hyperplasia)   . Chest pain, unspecified   . Diabetes mellitus without complication ([place])   . Dyspnea   . Hyperlipidemia   . Hypertension     Family History: Family History  Problem Relation Age of Onset  . Coronary artery disease Other   . Diabetes Other       Review of Systems  Constitutional: Positive for fatigue. Negative for chills and unexpected weight change.  HENT: Negative for congestion, postnasal drip, rhinorrhea, sneezing and sore throat.   Eyes: Negative for photophobia, redness and visual disturbance.  Respiratory: Positive for shortness of breath. Negative for cough and chest tightness.   Cardiovascular: Negative for chest pain, palpitations and leg swelling.  Gastrointestinal: Negative for abdominal pain, constipation, diarrhea, nausea  and vomiting.  Genitourinary: Negative for dysuria and frequency.  Musculoskeletal: Negative for arthralgias, back pain, joint swelling and neck pain.  Skin: Negative for rash.       Lesions on scalp  Neurological: Positive for weakness. Negative for dizziness, tremors, light-headedness, numbness and headaches.  Hematological: Negative for adenopathy. Does not bruise/bleed easily.  Psychiatric/Behavioral: Negative for behavioral problems (Depression), sleep disturbance and suicidal ideas. The patient is not nervous/anxious.      Vital Signs: BP 133/70   Pulse 88   Temp 97.9 F (36.6 C)   Resp 16   Ht $R'5\' 9"'Ra$  (1.753 m)   Wt 186 lb (84.4 kg)   SpO2 97%   BMI 27.47 kg/m    Physical Exam Vitals reviewed.  Constitutional:      Appearance: He is ill-appearing.  Cardiovascular:     Rate and Rhythm: Normal rate and regular rhythm.     Pulses: Normal pulses.     Heart sounds: Normal heart sounds.  Pulmonary:     Effort: Pulmonary effort is normal.     Breath sounds: Normal breath sounds.  Abdominal:     General: Abdomen is flat.  Musculoskeletal:        General: Normal range of motion.     Cervical back: Normal range of motion.  Skin:    Comments: Lesions on forehead and scalp are--white in color, round and raised  Neurological:     General: No focal deficit present.     Mental Status: He is alert and oriented to person, place, and time. Mental status is at baseline.  Psychiatric:        Mood and Affect: Mood normal.        Behavior: Behavior normal.        Thought Content: Thought content normal.      LABS: Recent Results (from the past 2160 hour(s))  Basic metabolic panel     Status: Abnormal   Collection Time: 01/13/20  8:12 PM  Result Value Ref Range   Sodium 132 (L) 135 - 145 mmol/L   Potassium 2.9 (L) 3.5 - 5.1 mmol/L   Chloride 96 (L) 98 - 111 mmol/L   CO2 23 22 - 32 mmol/L   Glucose, Bld 274 (H) 70 - 99 mg/dL    Comment: Glucose reference range applies only  to samples taken after fasting for at least 8 hours.   BUN 45 (H) 8 - 23 mg/dL   Creatinine, Ser 2.56 (H) 0.61 - 1.24 mg/dL   Calcium 8.1 (L) 8.9 - 10.3 mg/dL   GFR  calc non Af Amer 24 (L) >60 mL/min   GFR calc Af Amer 28 (L) >60 mL/min   Anion gap 13 5 - 15    Comment: Performed at [facility], [address] [place], [place] [address]  CBC     Status: Abnormal   Collection Time: 01/13/20  8:12 PM  Result Value Ref Range   WBC 10.8 (H) 4.0 - 10.5 K/uL   RBC 3.85 (L) 4.22 - 5.81 MIL/uL   Hemoglobin 10.1 (L) 13.0 - 17.0 g/dL   HCT 31.9 (L) 39 - 52 %   MCV 82.9 80.0 - 100.0 fL   MCH 26.2 26.0 - 34.0 pg   MCHC 31.7 30.0 - 36.0 g/dL   RDW 14.5 11.5 - 15.5 %   Platelets 143 (L) 150 - 400 K/uL   nRBC 0.0 0.0 - 0.2 %    Comment: Performed at [facility], [address] [place], [place] [address]  SARS Coronavirus 2 by RT PCR (hospital order, performed in [facility] hospital lab) Nasopharyngeal Nasopharyngeal Swab     Status: None   Collection Time: 01/13/20  8:12 PM   Specimen: Nasopharyngeal Swab  Result Value Ref Range   SARS Coronavirus 2 NEGATIVE NEGATIVE    Comment: (NOTE) SARS-CoV-2 target nucleic acids are NOT DETECTED.  The SARS-CoV-2 RNA is generally detectable in upper and lower respiratory specimens during the acute phase of infection. The lowest concentration of SARS-CoV-2 viral copies this assay can detect is 250 copies / mL. A negative result does not preclude SARS-CoV-2 infection and should not be used as the sole basis for treatment or other patient management decisions.  A negative result may occur with improper specimen collection / handling, submission of specimen other than nasopharyngeal swab, presence of viral mutation(s) within the areas targeted by this assay, and inadequate number of viral copies (<250 copies / mL). A negative result must be combined with clinical observations, patient history, and epidemiological information.  Fact  Sheet for Patients:   StrictlyIdeas.no  Fact Sheet for Healthcare Providers: BankingDealers.co.za  This test is not yet approved or  cleared by the [place] FDA and has been authorized for detection and/or diagnosis of SARS-CoV-2 by FDA under an Emergency Use Authorization (EUA).  This EUA will remain in effect (meaning this test can be used) for the duration of the COVID-19 declaration under Section 564(b)(1) of the Act, 21 U.S.C. section 360bbb-3(b)(1), unless the authorization is terminated or revoked sooner.  Performed at [facility], [address] [place], [place] [address]   Glucose, capillary     Status: Abnormal   Collection Time: 01/13/20  8:24 PM  Result Value Ref Range   Glucose-Capillary 273 (H) 70 - 99 mg/dL    Comment: Glucose reference range applies only to samples taken after fasting for at least 8 hours.  Basic metabolic panel     Status: Abnormal   Collection Time: 01/14/20  5:09 AM  Result Value Ref Range   Sodium 134 (L) 135 - 145 mmol/L   Potassium 3.6 3.5 - 5.1 mmol/L   Chloride 97 (L) 98 - 111 mmol/L   CO2 27 22 - 32 mmol/L   Glucose, Bld 164 (H) 70 - 99 mg/dL    Comment: Glucose reference range applies only to samples taken after fasting for at least 8 hours.   BUN 44 (H) 8 - 23 mg/dL   Creatinine, Ser 2.53 (H) 0.61 - 1.24 mg/dL   Calcium 8.2 (L) 8.9 -  10.3 mg/dL   GFR calc non Af Amer 25 (L) >60 mL/min   GFR calc Af Amer 28 (L) >60 mL/min   Anion gap 10 5 - 15    Comment: Performed at [facility], [address] [place], [place] [address]  Hemoglobin A1c     Status: Abnormal   Collection Time: 01/14/20  5:09 AM  Result Value Ref Range   Hgb A1c MFr Bld 10.3 (H) 4.8 - 5.6 %    Comment: (NOTE) Pre diabetes:          5.7%-6.4%  Diabetes:              >6.4%  Glycemic control for   <7.0% adults with diabetes    Mean Plasma Glucose 248.91 mg/dL    Comment: Performed at  [facility] [address], [place], [place] [address]  ECHOCARDIOGRAM COMPLETE     Status: None   Collection Time: 01/14/20  2:04 PM  Result Value Ref Range   Weight 3,648 oz   Height 69 in   BP 104/64 mmHg   S' Lateral 2.70 cm  Glucose, capillary     Status: Abnormal   Collection Time: 01/14/20  5:19 PM  Result Value Ref Range   Glucose-Capillary 260 (H) 70 - 99 mg/dL    Comment: Glucose reference range applies only to samples taken after fasting for at least 8 hours.   Comment 1 Notify RN    Comment 2 Document in Chart   Brain natriuretic peptide     Status: Abnormal   Collection Time: 01/14/20  6:23 PM  Result Value Ref Range   B Natriuretic Peptide 990.4 (H) 0.0 - 100.0 pg/mL    Comment: Performed at [facility], [address] [place], [place] [address]  Glucose, capillary     Status: Abnormal   Collection Time: 01/14/20  8:59 PM  Result Value Ref Range   Glucose-Capillary 245 (H) 70 - 99 mg/dL    Comment: Glucose reference range applies only to samples taken after fasting for at least 8 hours.  Blood gas, arterial     Status: Abnormal   Collection Time: 01/14/20  9:00 PM  Result Value Ref Range   FIO2 0.40    Delivery systems BILEVEL POSITIVE AIRWAY PRESSURE    LHR 8 resp/min   Inspiratory PAP 12    Expiratory PAP 6    pH, Arterial 7.36 7.35 - 7.45   pCO2 arterial 40 32 - 48 mmHg   pO2, Arterial 73 (L) 83 - 108 mmHg   Bicarbonate 22.6 20.0 - 28.0 mmol/L   Acid-base deficit 2.7 (H) 0.0 - 2.0 mmol/L   O2 Saturation 93.8 %   Patient temperature 37.0    Collection site RIGHT RADIAL    Sample type ARTERIAL DRAW    Allens test (pass/fail) PASS PASS   Mechanical Rate 8     Comment: Performed at [facility], [address] [place], [place] [address]  CBC with Differential/Platelet     Status: Abnormal   Collection Time: 01/14/20  9:17 PM  Result Value Ref Range   WBC 13.5 (H) 4.0 - 10.5 K/uL   RBC 4.15 (L) 4.22 - 5.81 MIL/uL    Hemoglobin 11.1 (L) 13.0 - 17.0 g/dL   HCT 33.6 (L) 39 - 52 %   MCV 81.0 80.0 - 100.0 fL   MCH 26.7 26.0 - 34.0 pg   MCHC 33.0 30.0 - 36.0 g/dL   RDW 15.3  11.5 - 15.5 %   Platelets 139 (L) 150 - 400 K/uL   nRBC 0.0 0.0 - 0.2 %   Neutrophils Relative % 92 %   Neutro Abs 12.3 (H) 1.7 - 7.7 K/uL   Lymphocytes Relative 2 %   Lymphs Abs 0.3 (L) 0.7 - 4.0 K/uL   Monocytes Relative 4 %   Monocytes Absolute 0.6 0.1 - 1.0 K/uL   Eosinophils Relative 1 %   Eosinophils Absolute 0.1 0.0 - 0.5 K/uL   Basophils Relative 0 %   Basophils Absolute 0.1 0.0 - 0.1 K/uL   Immature Granulocytes 1 %   Abs Immature Granulocytes 0.10 (H) 0.00 - 0.07 K/uL    Comment: Performed at [facility], [address] [place], [place] [address]  Procalcitonin - Baseline     Status: None   Collection Time: 01/14/20  9:17 PM  Result Value Ref Range   Procalcitonin 0.63 ng/mL    Comment:        Interpretation: PCT > 0.5 ng/mL and <= 2 ng/mL: Systemic infection (sepsis) is possible, but other conditions are known to elevate PCT as well. (NOTE)       Sepsis PCT Algorithm           Lower Respiratory Tract                                      Infection PCT Algorithm    ----------------------------     ----------------------------         PCT < 0.25 ng/mL                PCT < 0.10 ng/mL          Strongly encourage             Strongly discourage   discontinuation of antibiotics    initiation of antibiotics    ----------------------------     -----------------------------       PCT 0.25 - 0.50 ng/mL            PCT 0.10 - 0.25 ng/mL               OR       >80% decrease in PCT            Discourage initiation of                                            antibiotics      Encourage discontinuation           of antibiotics    ----------------------------     -----------------------------         PCT >= 0.50 ng/mL              PCT 0.26 - 0.50 ng/mL                AND       <80% decrease in PCT              Encourage initiation of                                             antibiotics  Encourage continuation           of antibiotics    ----------------------------     -----------------------------        PCT >= 0.50 ng/mL                  PCT > 0.50 ng/mL               AND         increase in PCT                  Strongly encourage                                      initiation of antibiotics    Strongly encourage escalation           of antibiotics                                     -----------------------------                                           PCT <= 0.25 ng/mL                                                 OR                                        > 80% decrease in PCT                                      Discontinue / Do not initiate                                             antibiotics  Performed at [facility], [address] [place], [place] [address]   Comprehensive metabolic panel     Status: Abnormal   Collection Time: 01/14/20  9:17 PM  Result Value Ref Range   Sodium 132 (L) 135 - 145 mmol/L   Potassium 4.3 3.5 - 5.1 mmol/L   Chloride 97 (L) 98 - 111 mmol/L   CO2 23 22 - 32 mmol/L   Glucose, Bld 261 (H) 70 - 99 mg/dL    Comment: Glucose reference range applies only to samples taken after fasting for at least 8 hours.   BUN 50 (H) 8 - 23 mg/dL   Creatinine, Ser 3.01 (H) 0.61 - 1.24 mg/dL   Calcium 8.1 (L) 8.9 - 10.3 mg/dL   Total Protein 6.2 (L) 6.5 - 8.1 g/dL   Albumin 3.3 (L) 3.5 - 5.0 g/dL   AST 26 15 - 41 U/L   ALT 17 0 - 44 U/L   Alkaline Phosphatase 58 38 - 126 U/L   Total Bilirubin 1.2 0.3 -  1.2 mg/dL   GFR calc non Af Amer 20 (L) >60 mL/min   GFR calc Af Amer 23 (L) >60 mL/min   Anion gap 12 5 - 15    Comment: Performed at [facility], [address] [place], [place] [address]  Lactic acid, plasma     Status: None   Collection Time: 01/14/20  9:17 PM  Result Value Ref Range   Lactic Acid, Venous 1.8 0.5 - 1.9  mmol/L    Comment: Performed at [facility], [address], [place], [place] [address]  Magnesium     Status: None   Collection Time: 01/14/20  9:17 PM  Result Value Ref Range   Magnesium 2.1 1.7 - 2.4 mg/dL    Comment: Performed at [facility], [address] [place], [place] [address]  Lactic acid, plasma     Status: None   Collection Time: 01/15/20 12:21 AM  Result Value Ref Range   Lactic Acid, Venous 1.7 0.5 - 1.9 mmol/L    Comment: Performed at [facility], [address] [place], [place] [address]  Glucose, capillary     Status: Abnormal   Collection Time: 01/15/20  7:41 AM  Result Value Ref Range   Glucose-Capillary 299 (H) 70 - 99 mg/dL    Comment: Glucose reference range applies only to samples taken after fasting for at least 8 hours.  MRSA PCR Screening     Status: None   Collection Time: 01/15/20  8:15 AM   Specimen: Nasal Mucosa; Nasopharyngeal  Result Value Ref Range   MRSA by PCR NEGATIVE NEGATIVE    Comment:        The GeneXpert MRSA Assay (FDA approved for NASAL specimens only), is one component of a comprehensive MRSA colonization surveillance program. It is not intended to diagnose MRSA infection nor to guide or monitor treatment for MRSA infections. Performed at [facility], [address] [place],  [address]   Glucose, capillary     Status: Abnormal   Collection Time: 01/15/20 11:17 AM  Result Value Ref Range   Glucose-Capillary 309 (H) 70 - 99 mg/dL    Comment: Glucose reference range applies only to samples taken after fasting for at least 8 hours.  Glucose, capillary     Status: Abnormal   Collection Time: 01/15/20  4:15 PM  Result Value Ref Range   Glucose-Capillary 306 (H) 70 - 99 mg/dL    Comment: Glucose reference range applies only to samples taken after fasting for at least 8 hours.  Glucose, capillary     Status: Abnormal   Collection Time: 01/15/20  8:33 PM  Result Value Ref Range    Glucose-Capillary 327 (H) 70 - 99 mg/dL    Comment: Glucose reference range applies only to samples taken after fasting for at least 8 hours.  CBC     Status: Abnormal   Collection Time: 01/16/20  4:48 AM  Result Value Ref Range   WBC 14.0 (H) 4.0 - 10.5 K/uL   RBC 3.53 (L) 4.22 - 5.81 MIL/uL   Hemoglobin 9.4 (L) 13.0 - 17.0 g/dL   HCT 29.2 (L) 39 - 52 %   MCV 82.7 80.0 - 100.0 fL   MCH 26.6 26.0 - 34.0 pg   MCHC 32.2 30.0 - 36.0 g/dL   RDW 15.4 11.5 - 15.5 %   Platelets 133 (L) 150 - 400 K/uL   nRBC 0.0 0.0 - 0.2 %    Comment: Performed at [facility], [address] [place], [place] [address]  Basic metabolic panel     Status: Abnormal   Collection Time: 01/16/20  4:48 AM  Result Value Ref Range   Sodium 135 135 - 145 mmol/L   Potassium 4.3 3.5 - 5.1 mmol/L   Chloride 100 98 - 111 mmol/L   CO2 21 (L) 22 - 32 mmol/L   Glucose, Bld 359 (H) 70 - 99 mg/dL    Comment: Glucose reference range applies only to samples taken after fasting for at least 8 hours.   BUN 86 (H) 8 - 23 mg/dL   Creatinine, Ser 3.70 (H) 0.61 - 1.24 mg/dL   Calcium 8.4 (L) 8.9 - 10.3 mg/dL   GFR calc non Af Amer 16 (L) >60 mL/min   GFR calc Af Amer 18 (L) >60 mL/min   Anion gap 14 5 - 15    Comment: Performed at [facility], [address] [place], [place] [address]  Glucose, capillary     Status: Abnormal   Collection Time: 01/16/20  8:05 AM  Result Value Ref Range   Glucose-Capillary 386 (H) 70 - 99 mg/dL    Comment: Glucose reference range applies only to samples taken after fasting for at least 8 hours.  Glucose, capillary     Status: Abnormal   Collection Time: 01/16/20 12:05 PM  Result Value Ref Range   Glucose-Capillary 499 (H) 70 - 99 mg/dL    Comment: Glucose reference range applies only to samples taken after fasting for at least 8 hours.  Glucose, capillary     Status: Abnormal   Collection Time: 01/16/20  4:53 PM  Result Value Ref Range   Glucose-Capillary 438 (H) 70 -  99 mg/dL    Comment: Glucose reference range applies only to samples taken after fasting for at least 8 hours.  Glucose, capillary     Status: Abnormal   Collection Time: 01/16/20  8:58 PM  Result Value Ref Range   Glucose-Capillary 360 (H) 70 - 99 mg/dL    Comment: Glucose reference range applies only to samples taken after fasting for at least 8 hours.  Basic metabolic panel     Status: Abnormal   Collection Time: 01/17/20  7:51 AM  Result Value Ref Range   Sodium 136 135 - 145 mmol/L   Potassium 4.2 3.5 - 5.1 mmol/L   Chloride 104 98 - 111 mmol/L   CO2 20 (L) 22 - 32 mmol/L   Glucose, Bld 212 (H) 70 - 99 mg/dL    Comment: Glucose reference range applies only to samples taken after fasting for at least 8 hours.   BUN 99 (H) 8 - 23 mg/dL    Comment: RESULT CONFIRMED BY MANUAL DILUTION DAS   Creatinine, Ser 4.05 (H) 0.61 - 1.24 mg/dL   Calcium 8.3 (L) 8.9 - 10.3 mg/dL   GFR calc non Af Amer 14 (L) >60 mL/min   GFR calc Af Amer 16 (L) >60 mL/min   Anion gap 12 5 - 15    Comment: Performed at [facility], [address] [place], [place] [address]  Glucose, capillary     Status: Abnormal   Collection Time: 01/17/20  7:52 AM  Result Value Ref Range   Glucose-Capillary 206 (H) 70 - 99 mg/dL    Comment: Glucose reference range applies only to samples taken after fasting for at least 8 hours.  Glucose, capillary     Status: Abnormal   Collection Time: 01/17/20 11:28  AM  Result Value Ref Range   Glucose-Capillary 247 (H) 70 - 99 mg/dL    Comment: Glucose reference range applies only to samples taken after fasting for at least 8 hours.  Glucose, capillary     Status: Abnormal   Collection Time: 01/17/20  5:10 PM  Result Value Ref Range   Glucose-Capillary 399 (H) 70 - 99 mg/dL    Comment: Glucose reference range applies only to samples taken after fasting for at least 8 hours.  Protime-INR     Status: None   Collection Time: 01/17/20  6:20 PM  Result Value Ref Range    Prothrombin Time 14.1 11.4 - 15.2 seconds   INR 1.1 0.8 - 1.2    Comment: (NOTE) INR goal varies based on device and disease states. Performed at [facility], [address] [place], [place] [address]   APTT     Status: None   Collection Time: 01/17/20  6:20 PM  Result Value Ref Range   aPTT 26 24 - 36 seconds    Comment: Performed at [facility], [address] [place], [place] [address]  Glucose, capillary     Status: Abnormal   Collection Time: 01/17/20  9:49 PM  Result Value Ref Range   Glucose-Capillary 362 (H) 70 - 99 mg/dL    Comment: Glucose reference range applies only to samples taken after fasting for at least 8 hours.  CBC     Status: Abnormal   Collection Time: 01/17/20 10:57 PM  Result Value Ref Range   WBC 14.4 (H) 4.0 - 10.5 K/uL   RBC 3.71 (L) 4.22 - 5.81 MIL/uL   Hemoglobin 9.7 (L) 13.0 - 17.0 g/dL   HCT 30.7 (L) 39 - 52 %   MCV 82.7 80.0 - 100.0 fL   MCH 26.1 26.0 - 34.0 pg   MCHC 31.6 30.0 - 36.0 g/dL   RDW 15.7 (H) 11.5 - 15.5 %   Platelets 163 150 - 400 K/uL   nRBC 0.1 0.0 - 0.2 %    Comment: Performed at [facility], [address] [place], [place] [address]  Glucose, capillary     Status: Abnormal   Collection Time: 01/18/20  8:35 AM  Result Value Ref Range   Glucose-Capillary 247 (H) 70 - 99 mg/dL    Comment: Glucose reference range applies only to samples taken after fasting for at least 8 hours.  Basic metabolic panel     Status: Abnormal   Collection Time: 01/18/20 11:25 AM  Result Value Ref Range   Sodium 134 (L) 135 - 145 mmol/L   Potassium 5.0 3.5 - 5.1 mmol/L   Chloride 99 98 - 111 mmol/L   CO2 19 (L) 22 - 32 mmol/L   Glucose, Bld 327 (H) 70 - 99 mg/dL    Comment: Glucose reference range applies only to samples taken after fasting for at least 8 hours.   BUN 111 (H) 8 - 23 mg/dL    Comment: RESULT CONFIRMED BY MANUAL DILUTION [facility]    Creatinine, Ser 5.06 (H) 0.61 - 1.24 mg/dL   Calcium 8.6 (L) 8.9 -  10.3 mg/dL   GFR calc non Af Amer 11 (L) >60 mL/min   GFR calc Af Amer 12 (L) >60 mL/min   Anion gap 16 (H) 5 - 15    Comment: Performed at [facility], [address] [place], [place] [address]  Heparin level (unfractionated)     Status: Abnormal   Collection Time:  01/18/20 11:25 AM  Result Value Ref Range   Heparin Unfractionated 1.56 (H) 0.30 - 0.70 IU/mL    Comment: (NOTE) If heparin results are below expected values, and patient dosage has  been confirmed, suggest follow up testing of antithrombin III levels. Performed at [facility], [address] [place], [place] [address]   Glucose, capillary     Status: Abnormal   Collection Time: 01/18/20 12:27 PM  Result Value Ref Range   Glucose-Capillary 287 (H) 70 - 99 mg/dL    Comment: Glucose reference range applies only to samples taken after fasting for at least 8 hours.  Glucose, capillary     Status: Abnormal   Collection Time: 01/18/20  4:48 PM  Result Value Ref Range   Glucose-Capillary 340 (H) 70 - 99 mg/dL    Comment: Glucose reference range applies only to samples taken after fasting for at least 8 hours.  Heparin level (unfractionated)     Status: Abnormal   Collection Time: 01/18/20  8:31 PM  Result Value Ref Range   Heparin Unfractionated 1.16 (H) 0.30 - 0.70 IU/mL    Comment: (NOTE) If heparin results are below expected values, and patient dosage has  been confirmed, suggest follow up testing of antithrombin III levels. Performed at [facility], [address] [place], [place] [address]   Glucose, capillary     Status: Abnormal   Collection Time: 01/18/20  9:32 PM  Result Value Ref Range   Glucose-Capillary 338 (H) 70 - 99 mg/dL    Comment: Glucose reference range applies only to samples taken after fasting for at least 8 hours.  SARS Coronavirus 2 by RT PCR (hospital order, performed in [facility] hospital lab) Nasopharyngeal Nasopharyngeal Swab     Status: None   Collection  Time: 01/18/20 11:38 PM   Specimen: Nasopharyngeal Swab  Result Value Ref Range   SARS Coronavirus 2 NEGATIVE NEGATIVE    Comment: (NOTE) SARS-CoV-2 target nucleic acids are NOT DETECTED.  The SARS-CoV-2 RNA is generally detectable in upper and lower respiratory specimens during the acute phase of infection. The lowest concentration of SARS-CoV-2 viral copies this assay can detect is 250 copies / mL. A negative result does not preclude SARS-CoV-2 infection and should not be used as the sole basis for treatment or other patient management decisions.  A negative result may occur with improper specimen collection / handling, submission of specimen other than nasopharyngeal swab, presence of viral mutation(s) within the areas targeted by this assay, and inadequate number of viral copies (<250 copies / mL). A negative result must be combined with clinical observations, patient history, and epidemiological information.  Fact Sheet for Patients:   BoilerBrush.com.cy  Fact Sheet for Healthcare Providers: https://pope.com/  This test is not yet approved or  cleared by the [place] FDA and has been authorized for detection and/or diagnosis of SARS-CoV-2 by FDA under an Emergency Use Authorization (EUA).  This EUA will remain in effect (meaning this test can be used) for the duration of the COVID-19 declaration under Section 564(b)(1) of the Act, 21 U.S.C. section 360bbb-3(b)(1), unless the authorization is terminated or revoked sooner.  Performed at [facility], [address] [place], [place] [address]     Assessment/Plan: 1. Encounter for routine adult health examination with abnormal findings Chronically ill appearing 74 year old male Reviewed pertinent labs ordered by other providers--remain stable Up to date on PHM  2. Lung transplant failure (HCC) Currently participating in clinical trial--followed closely  by  many providers at [facility] - Ambulatory referral to Dermatology  3. Uncontrolled type 2 diabetes mellitus with hyperglycemia ([place]) Established and managed by endocrinology  4. Hyperlipidemia, unspecified hyperlipidemia type Most recent lipid panel stable, continue with rosuvastatin at this time  5. Chronic systolic heart failure (HCC) Followed closely and managed by cardiology  6. Essential hypertension BP and HR well controlled today, continue with routine monitoring  7. Skin lesions Referral to Dermatologist at [facility] who specializes in lung transplant patients - Ambulatory referral to Dermatology  General Counseling: tykee heideman understanding of the findings of todays visit and agrees with plan of treatment. I have discussed any further diagnostic evaluation that may be needed or ordered today. We also reviewed his medications today. he has been encouraged to call the office with any questions or concerns that should arise related to todays visit.    Counseling:    Orders Placed This Encounter  Procedures  . Ambulatory referral to Dermatology      Total time spent: 30 Minutes  Time spent includes review of chart, medications, test results, and follow up plan with the patient.   This patient was seen by Casey Burkitt AGNP-C Collaboration with Dr Lavera Guise as a part of collaborative care agreement   Tanna Furry. [facility] Internal Medicine

## 2020-03-27 DIAGNOSIS — Z4502 Encounter for adjustment and management of automatic implantable cardiac defibrillator: Secondary | ICD-10-CM | POA: Diagnosis not present

## 2020-03-29 DIAGNOSIS — Z4502 Encounter for adjustment and management of automatic implantable cardiac defibrillator: Secondary | ICD-10-CM | POA: Diagnosis not present

## 2020-03-30 ENCOUNTER — Encounter: Payer: Self-pay | Admitting: Hospice and Palliative Medicine

## 2020-04-14 DIAGNOSIS — N400 Enlarged prostate without lower urinary tract symptoms: Secondary | ICD-10-CM | POA: Diagnosis not present

## 2020-04-14 DIAGNOSIS — Z79899 Other long term (current) drug therapy: Secondary | ICD-10-CM | POA: Diagnosis not present

## 2020-04-14 DIAGNOSIS — J84112 Idiopathic pulmonary fibrosis: Secondary | ICD-10-CM | POA: Diagnosis not present

## 2020-04-14 DIAGNOSIS — N184 Chronic kidney disease, stage 4 (severe): Secondary | ICD-10-CM | POA: Diagnosis not present

## 2020-04-14 DIAGNOSIS — E1122 Type 2 diabetes mellitus with diabetic chronic kidney disease: Secondary | ICD-10-CM | POA: Diagnosis not present

## 2020-04-14 DIAGNOSIS — Z942 Lung transplant status: Secondary | ICD-10-CM | POA: Diagnosis not present

## 2020-04-14 DIAGNOSIS — Z5181 Encounter for therapeutic drug level monitoring: Secondary | ICD-10-CM | POA: Diagnosis not present

## 2020-04-14 DIAGNOSIS — R454 Irritability and anger: Secondary | ICD-10-CM | POA: Diagnosis not present

## 2020-04-14 DIAGNOSIS — G4733 Obstructive sleep apnea (adult) (pediatric): Secondary | ICD-10-CM | POA: Diagnosis not present

## 2020-04-14 DIAGNOSIS — I5189 Other ill-defined heart diseases: Secondary | ICD-10-CM | POA: Diagnosis not present

## 2020-04-14 DIAGNOSIS — D84821 Immunodeficiency due to drugs: Secondary | ICD-10-CM | POA: Diagnosis not present

## 2020-04-14 DIAGNOSIS — Z85828 Personal history of other malignant neoplasm of skin: Secondary | ICD-10-CM | POA: Diagnosis not present

## 2020-04-14 DIAGNOSIS — I2510 Atherosclerotic heart disease of native coronary artery without angina pectoris: Secondary | ICD-10-CM | POA: Diagnosis not present

## 2020-04-14 DIAGNOSIS — E785 Hyperlipidemia, unspecified: Secondary | ICD-10-CM | POA: Diagnosis not present

## 2020-04-14 DIAGNOSIS — T86818 Other complications of lung transplant: Secondary | ICD-10-CM | POA: Diagnosis not present

## 2020-04-16 DIAGNOSIS — T86810 Lung transplant rejection: Secondary | ICD-10-CM | POA: Diagnosis not present

## 2020-04-16 DIAGNOSIS — Z9981 Dependence on supplemental oxygen: Secondary | ICD-10-CM | POA: Diagnosis not present

## 2020-04-16 DIAGNOSIS — Z006 Encounter for examination for normal comparison and control in clinical research program: Secondary | ICD-10-CM | POA: Diagnosis not present

## 2020-04-17 DIAGNOSIS — Z006 Encounter for examination for normal comparison and control in clinical research program: Secondary | ICD-10-CM | POA: Diagnosis not present

## 2020-04-17 DIAGNOSIS — T86810 Lung transplant rejection: Secondary | ICD-10-CM | POA: Diagnosis not present

## 2020-04-28 DIAGNOSIS — S72001D Fracture of unspecified part of neck of right femur, subsequent encounter for closed fracture with routine healing: Secondary | ICD-10-CM | POA: Diagnosis not present

## 2020-04-28 DIAGNOSIS — Z87891 Personal history of nicotine dependence: Secondary | ICD-10-CM | POA: Diagnosis not present

## 2020-04-28 DIAGNOSIS — W1839XD Other fall on same level, subsequent encounter: Secondary | ICD-10-CM | POA: Diagnosis not present

## 2020-05-14 DIAGNOSIS — T86810 Lung transplant rejection: Secondary | ICD-10-CM | POA: Diagnosis not present

## 2020-05-14 DIAGNOSIS — Z006 Encounter for examination for normal comparison and control in clinical research program: Secondary | ICD-10-CM | POA: Diagnosis not present

## 2020-05-14 DIAGNOSIS — J42 Unspecified chronic bronchitis: Secondary | ICD-10-CM | POA: Diagnosis not present

## 2020-05-15 DIAGNOSIS — J42 Unspecified chronic bronchitis: Secondary | ICD-10-CM | POA: Diagnosis not present

## 2020-05-15 DIAGNOSIS — Z006 Encounter for examination for normal comparison and control in clinical research program: Secondary | ICD-10-CM | POA: Diagnosis not present

## 2020-05-15 DIAGNOSIS — T86810 Lung transplant rejection: Secondary | ICD-10-CM | POA: Diagnosis not present

## 2020-05-20 DIAGNOSIS — E1165 Type 2 diabetes mellitus with hyperglycemia: Secondary | ICD-10-CM | POA: Diagnosis not present

## 2020-05-20 DIAGNOSIS — Z794 Long term (current) use of insulin: Secondary | ICD-10-CM | POA: Diagnosis not present

## 2020-06-11 DIAGNOSIS — T86810 Lung transplant rejection: Secondary | ICD-10-CM | POA: Diagnosis not present

## 2020-06-11 DIAGNOSIS — Z006 Encounter for examination for normal comparison and control in clinical research program: Secondary | ICD-10-CM | POA: Diagnosis not present

## 2020-06-11 DIAGNOSIS — J42 Unspecified chronic bronchitis: Secondary | ICD-10-CM | POA: Diagnosis not present

## 2020-06-11 DIAGNOSIS — Z794 Long term (current) use of insulin: Secondary | ICD-10-CM | POA: Diagnosis not present

## 2020-06-11 DIAGNOSIS — D509 Iron deficiency anemia, unspecified: Secondary | ICD-10-CM | POA: Diagnosis not present

## 2020-06-11 DIAGNOSIS — E1165 Type 2 diabetes mellitus with hyperglycemia: Secondary | ICD-10-CM | POA: Diagnosis not present

## 2020-06-12 DIAGNOSIS — D509 Iron deficiency anemia, unspecified: Secondary | ICD-10-CM | POA: Diagnosis not present

## 2020-06-26 DIAGNOSIS — Z4502 Encounter for adjustment and management of automatic implantable cardiac defibrillator: Secondary | ICD-10-CM | POA: Diagnosis not present

## 2020-06-28 DIAGNOSIS — Z4502 Encounter for adjustment and management of automatic implantable cardiac defibrillator: Secondary | ICD-10-CM | POA: Diagnosis not present

## 2020-07-09 DIAGNOSIS — T86810 Lung transplant rejection: Secondary | ICD-10-CM | POA: Diagnosis not present

## 2020-07-09 DIAGNOSIS — J42 Unspecified chronic bronchitis: Secondary | ICD-10-CM | POA: Diagnosis not present

## 2020-07-09 DIAGNOSIS — Z006 Encounter for examination for normal comparison and control in clinical research program: Secondary | ICD-10-CM | POA: Diagnosis not present

## 2020-07-10 DIAGNOSIS — Z942 Lung transplant status: Secondary | ICD-10-CM | POA: Diagnosis not present

## 2020-07-10 DIAGNOSIS — J42 Unspecified chronic bronchitis: Secondary | ICD-10-CM | POA: Diagnosis not present

## 2020-07-10 DIAGNOSIS — T86810 Lung transplant rejection: Secondary | ICD-10-CM | POA: Diagnosis not present

## 2020-07-10 DIAGNOSIS — Z006 Encounter for examination for normal comparison and control in clinical research program: Secondary | ICD-10-CM | POA: Diagnosis not present

## 2020-07-23 DIAGNOSIS — Z85828 Personal history of other malignant neoplasm of skin: Secondary | ICD-10-CM | POA: Diagnosis not present

## 2020-07-23 DIAGNOSIS — D2260 Melanocytic nevi of unspecified upper limb, including shoulder: Secondary | ICD-10-CM | POA: Diagnosis not present

## 2020-07-23 DIAGNOSIS — D485 Neoplasm of uncertain behavior of skin: Secondary | ICD-10-CM | POA: Diagnosis not present

## 2020-07-23 DIAGNOSIS — D849 Immunodeficiency, unspecified: Secondary | ICD-10-CM | POA: Diagnosis not present

## 2020-07-23 DIAGNOSIS — D2270 Melanocytic nevi of unspecified lower limb, including hip: Secondary | ICD-10-CM | POA: Diagnosis not present

## 2020-07-23 DIAGNOSIS — L814 Other melanin hyperpigmentation: Secondary | ICD-10-CM | POA: Diagnosis not present

## 2020-07-23 DIAGNOSIS — L570 Actinic keratosis: Secondary | ICD-10-CM | POA: Diagnosis not present

## 2020-07-23 DIAGNOSIS — L821 Other seborrheic keratosis: Secondary | ICD-10-CM | POA: Diagnosis not present

## 2020-07-23 DIAGNOSIS — D225 Melanocytic nevi of trunk: Secondary | ICD-10-CM | POA: Diagnosis not present

## 2020-07-23 DIAGNOSIS — Z1283 Encounter for screening for malignant neoplasm of skin: Secondary | ICD-10-CM | POA: Diagnosis not present

## 2020-07-28 ENCOUNTER — Ambulatory Visit: Payer: Medicare Other | Admitting: Internal Medicine

## 2020-08-06 DIAGNOSIS — J42 Unspecified chronic bronchitis: Secondary | ICD-10-CM | POA: Diagnosis not present

## 2020-08-06 DIAGNOSIS — Z006 Encounter for examination for normal comparison and control in clinical research program: Secondary | ICD-10-CM | POA: Diagnosis not present

## 2020-08-06 DIAGNOSIS — T86810 Lung transplant rejection: Secondary | ICD-10-CM | POA: Diagnosis not present

## 2020-08-07 DIAGNOSIS — T86810 Lung transplant rejection: Secondary | ICD-10-CM | POA: Diagnosis not present

## 2020-08-07 DIAGNOSIS — Z006 Encounter for examination for normal comparison and control in clinical research program: Secondary | ICD-10-CM | POA: Diagnosis not present

## 2020-08-07 DIAGNOSIS — J42 Unspecified chronic bronchitis: Secondary | ICD-10-CM | POA: Diagnosis not present

## 2020-08-20 DIAGNOSIS — Z20822 Contact with and (suspected) exposure to covid-19: Secondary | ICD-10-CM | POA: Diagnosis not present

## 2020-08-20 DIAGNOSIS — Z794 Long term (current) use of insulin: Secondary | ICD-10-CM | POA: Diagnosis not present

## 2020-08-20 DIAGNOSIS — Z4502 Encounter for adjustment and management of automatic implantable cardiac defibrillator: Secondary | ICD-10-CM | POA: Diagnosis not present

## 2020-08-20 DIAGNOSIS — E119 Type 2 diabetes mellitus without complications: Secondary | ICD-10-CM | POA: Diagnosis not present

## 2020-08-20 DIAGNOSIS — Z7901 Long term (current) use of anticoagulants: Secondary | ICD-10-CM | POA: Diagnosis not present

## 2020-08-20 DIAGNOSIS — I428 Other cardiomyopathies: Secondary | ICD-10-CM | POA: Diagnosis not present

## 2020-08-20 DIAGNOSIS — Z942 Lung transplant status: Secondary | ICD-10-CM | POA: Diagnosis not present

## 2020-08-20 DIAGNOSIS — J984 Other disorders of lung: Secondary | ICD-10-CM | POA: Diagnosis not present

## 2020-08-20 DIAGNOSIS — N179 Acute kidney failure, unspecified: Secondary | ICD-10-CM | POA: Diagnosis not present

## 2020-08-20 DIAGNOSIS — T380X5A Adverse effect of glucocorticoids and synthetic analogues, initial encounter: Secondary | ICD-10-CM | POA: Diagnosis not present

## 2020-08-20 DIAGNOSIS — I4891 Unspecified atrial fibrillation: Secondary | ICD-10-CM | POA: Diagnosis not present

## 2020-08-20 DIAGNOSIS — G4733 Obstructive sleep apnea (adult) (pediatric): Secondary | ICD-10-CM | POA: Diagnosis not present

## 2020-08-20 DIAGNOSIS — I5022 Chronic systolic (congestive) heart failure: Secondary | ICD-10-CM | POA: Diagnosis not present

## 2020-08-20 DIAGNOSIS — N3289 Other specified disorders of bladder: Secondary | ICD-10-CM | POA: Diagnosis not present

## 2020-08-20 DIAGNOSIS — Z79899 Other long term (current) drug therapy: Secondary | ICD-10-CM | POA: Diagnosis not present

## 2020-08-20 DIAGNOSIS — Z66 Do not resuscitate: Secondary | ICD-10-CM | POA: Diagnosis not present

## 2020-08-20 DIAGNOSIS — I110 Hypertensive heart disease with heart failure: Secondary | ICD-10-CM | POA: Diagnosis not present

## 2020-08-20 DIAGNOSIS — E785 Hyperlipidemia, unspecified: Secondary | ICD-10-CM | POA: Diagnosis not present

## 2020-08-20 DIAGNOSIS — E1165 Type 2 diabetes mellitus with hyperglycemia: Secondary | ICD-10-CM | POA: Diagnosis not present

## 2020-08-20 DIAGNOSIS — E1122 Type 2 diabetes mellitus with diabetic chronic kidney disease: Secondary | ICD-10-CM | POA: Diagnosis not present

## 2020-08-20 DIAGNOSIS — E11649 Type 2 diabetes mellitus with hypoglycemia without coma: Secondary | ICD-10-CM | POA: Diagnosis not present

## 2020-08-20 DIAGNOSIS — I5023 Acute on chronic systolic (congestive) heart failure: Secondary | ICD-10-CM | POA: Diagnosis not present

## 2020-08-20 DIAGNOSIS — Z7951 Long term (current) use of inhaled steroids: Secondary | ICD-10-CM | POA: Diagnosis not present

## 2020-08-20 DIAGNOSIS — D849 Immunodeficiency, unspecified: Secondary | ICD-10-CM | POA: Diagnosis not present

## 2020-08-20 DIAGNOSIS — K219 Gastro-esophageal reflux disease without esophagitis: Secondary | ICD-10-CM | POA: Diagnosis not present

## 2020-08-20 DIAGNOSIS — R109 Unspecified abdominal pain: Secondary | ICD-10-CM | POA: Diagnosis not present

## 2020-08-20 DIAGNOSIS — K2100 Gastro-esophageal reflux disease with esophagitis, without bleeding: Secondary | ICD-10-CM | POA: Diagnosis not present

## 2020-08-20 DIAGNOSIS — Z87891 Personal history of nicotine dependence: Secondary | ICD-10-CM | POA: Diagnosis not present

## 2020-08-20 DIAGNOSIS — R0600 Dyspnea, unspecified: Secondary | ICD-10-CM | POA: Diagnosis not present

## 2020-08-20 DIAGNOSIS — J42 Unspecified chronic bronchitis: Secondary | ICD-10-CM | POA: Diagnosis not present

## 2020-08-20 DIAGNOSIS — J849 Interstitial pulmonary disease, unspecified: Secondary | ICD-10-CM | POA: Diagnosis not present

## 2020-08-20 DIAGNOSIS — I5020 Unspecified systolic (congestive) heart failure: Secondary | ICD-10-CM | POA: Diagnosis not present

## 2020-08-20 DIAGNOSIS — D508 Other iron deficiency anemias: Secondary | ICD-10-CM | POA: Diagnosis not present

## 2020-08-20 DIAGNOSIS — J9621 Acute and chronic respiratory failure with hypoxia: Secondary | ICD-10-CM | POA: Diagnosis not present

## 2020-08-20 DIAGNOSIS — K5900 Constipation, unspecified: Secondary | ICD-10-CM | POA: Diagnosis not present

## 2020-08-20 DIAGNOSIS — T86810 Lung transplant rejection: Secondary | ICD-10-CM | POA: Diagnosis not present

## 2020-08-20 DIAGNOSIS — Z95810 Presence of automatic (implantable) cardiac defibrillator: Secondary | ICD-10-CM | POA: Diagnosis not present

## 2020-08-20 DIAGNOSIS — D84821 Immunodeficiency due to drugs: Secondary | ICD-10-CM | POA: Diagnosis not present

## 2020-08-20 DIAGNOSIS — N289 Disorder of kidney and ureter, unspecified: Secondary | ICD-10-CM | POA: Diagnosis not present

## 2020-08-20 DIAGNOSIS — I130 Hypertensive heart and chronic kidney disease with heart failure and stage 1 through stage 4 chronic kidney disease, or unspecified chronic kidney disease: Secondary | ICD-10-CM | POA: Diagnosis not present

## 2020-08-20 DIAGNOSIS — D509 Iron deficiency anemia, unspecified: Secondary | ICD-10-CM | POA: Diagnosis not present

## 2020-08-20 DIAGNOSIS — N184 Chronic kidney disease, stage 4 (severe): Secondary | ICD-10-CM | POA: Diagnosis not present

## 2020-08-31 DIAGNOSIS — N400 Enlarged prostate without lower urinary tract symptoms: Secondary | ICD-10-CM | POA: Diagnosis not present

## 2020-08-31 DIAGNOSIS — I2510 Atherosclerotic heart disease of native coronary artery without angina pectoris: Secondary | ICD-10-CM | POA: Diagnosis not present

## 2020-08-31 DIAGNOSIS — Z7952 Long term (current) use of systemic steroids: Secondary | ICD-10-CM | POA: Diagnosis not present

## 2020-08-31 DIAGNOSIS — Z8619 Personal history of other infectious and parasitic diseases: Secondary | ICD-10-CM | POA: Diagnosis not present

## 2020-08-31 DIAGNOSIS — Z87891 Personal history of nicotine dependence: Secondary | ICD-10-CM | POA: Diagnosis not present

## 2020-08-31 DIAGNOSIS — K219 Gastro-esophageal reflux disease without esophagitis: Secondary | ICD-10-CM | POA: Diagnosis not present

## 2020-08-31 DIAGNOSIS — I5022 Chronic systolic (congestive) heart failure: Secondary | ICD-10-CM | POA: Diagnosis not present

## 2020-08-31 DIAGNOSIS — Z7951 Long term (current) use of inhaled steroids: Secondary | ICD-10-CM | POA: Diagnosis not present

## 2020-08-31 DIAGNOSIS — Z8739 Personal history of other diseases of the musculoskeletal system and connective tissue: Secondary | ICD-10-CM | POA: Diagnosis not present

## 2020-08-31 DIAGNOSIS — I130 Hypertensive heart and chronic kidney disease with heart failure and stage 1 through stage 4 chronic kidney disease, or unspecified chronic kidney disease: Secondary | ICD-10-CM | POA: Diagnosis not present

## 2020-08-31 DIAGNOSIS — G4733 Obstructive sleep apnea (adult) (pediatric): Secondary | ICD-10-CM | POA: Diagnosis not present

## 2020-08-31 DIAGNOSIS — E785 Hyperlipidemia, unspecified: Secondary | ICD-10-CM | POA: Diagnosis not present

## 2020-08-31 DIAGNOSIS — E1122 Type 2 diabetes mellitus with diabetic chronic kidney disease: Secondary | ICD-10-CM | POA: Diagnosis not present

## 2020-08-31 DIAGNOSIS — I447 Left bundle-branch block, unspecified: Secondary | ICD-10-CM | POA: Diagnosis not present

## 2020-08-31 DIAGNOSIS — Z792 Long term (current) use of antibiotics: Secondary | ICD-10-CM | POA: Diagnosis not present

## 2020-08-31 DIAGNOSIS — N183 Chronic kidney disease, stage 3 unspecified: Secondary | ICD-10-CM | POA: Diagnosis not present

## 2020-08-31 DIAGNOSIS — Z8781 Personal history of (healed) traumatic fracture: Secondary | ICD-10-CM | POA: Diagnosis not present

## 2020-08-31 DIAGNOSIS — D509 Iron deficiency anemia, unspecified: Secondary | ICD-10-CM | POA: Diagnosis not present

## 2020-08-31 DIAGNOSIS — J449 Chronic obstructive pulmonary disease, unspecified: Secondary | ICD-10-CM | POA: Diagnosis not present

## 2020-08-31 DIAGNOSIS — Z7901 Long term (current) use of anticoagulants: Secondary | ICD-10-CM | POA: Diagnosis not present

## 2020-08-31 DIAGNOSIS — J9621 Acute and chronic respiratory failure with hypoxia: Secondary | ICD-10-CM | POA: Diagnosis not present

## 2020-08-31 DIAGNOSIS — Z794 Long term (current) use of insulin: Secondary | ICD-10-CM | POA: Diagnosis not present

## 2020-08-31 DIAGNOSIS — J84112 Idiopathic pulmonary fibrosis: Secondary | ICD-10-CM | POA: Diagnosis not present

## 2020-08-31 DIAGNOSIS — Z9981 Dependence on supplemental oxygen: Secondary | ICD-10-CM | POA: Diagnosis not present

## 2020-08-31 DIAGNOSIS — Z85828 Personal history of other malignant neoplasm of skin: Secondary | ICD-10-CM | POA: Diagnosis not present

## 2020-09-01 DIAGNOSIS — D72829 Elevated white blood cell count, unspecified: Secondary | ICD-10-CM | POA: Diagnosis not present

## 2020-09-01 DIAGNOSIS — N179 Acute kidney failure, unspecified: Secondary | ICD-10-CM | POA: Diagnosis not present

## 2020-09-01 DIAGNOSIS — Z942 Lung transplant status: Secondary | ICD-10-CM | POA: Diagnosis not present

## 2020-09-01 DIAGNOSIS — I2510 Atherosclerotic heart disease of native coronary artery without angina pectoris: Secondary | ICD-10-CM | POA: Diagnosis not present

## 2020-09-01 DIAGNOSIS — T86818 Other complications of lung transplant: Secondary | ICD-10-CM | POA: Diagnosis not present

## 2020-09-01 DIAGNOSIS — Z79899 Other long term (current) drug therapy: Secondary | ICD-10-CM | POA: Diagnosis not present

## 2020-09-01 DIAGNOSIS — N189 Chronic kidney disease, unspecified: Secondary | ICD-10-CM | POA: Diagnosis not present

## 2020-09-01 DIAGNOSIS — J84112 Idiopathic pulmonary fibrosis: Secondary | ICD-10-CM | POA: Diagnosis not present

## 2020-09-01 DIAGNOSIS — K219 Gastro-esophageal reflux disease without esophagitis: Secondary | ICD-10-CM | POA: Diagnosis not present

## 2020-09-01 DIAGNOSIS — N184 Chronic kidney disease, stage 4 (severe): Secondary | ICD-10-CM | POA: Diagnosis not present

## 2020-09-01 DIAGNOSIS — Z5181 Encounter for therapeutic drug level monitoring: Secondary | ICD-10-CM | POA: Diagnosis not present

## 2020-09-01 DIAGNOSIS — T86810 Lung transplant rejection: Secondary | ICD-10-CM | POA: Diagnosis not present

## 2020-09-01 DIAGNOSIS — G4733 Obstructive sleep apnea (adult) (pediatric): Secondary | ICD-10-CM | POA: Diagnosis not present

## 2020-09-01 DIAGNOSIS — R454 Irritability and anger: Secondary | ICD-10-CM | POA: Diagnosis not present

## 2020-09-01 DIAGNOSIS — Z8709 Personal history of other diseases of the respiratory system: Secondary | ICD-10-CM | POA: Diagnosis not present

## 2020-09-01 DIAGNOSIS — N400 Enlarged prostate without lower urinary tract symptoms: Secondary | ICD-10-CM | POA: Diagnosis not present

## 2020-09-01 DIAGNOSIS — D849 Immunodeficiency, unspecified: Secondary | ICD-10-CM | POA: Diagnosis not present

## 2020-09-01 DIAGNOSIS — I509 Heart failure, unspecified: Secondary | ICD-10-CM | POA: Diagnosis not present

## 2020-09-01 DIAGNOSIS — E1122 Type 2 diabetes mellitus with diabetic chronic kidney disease: Secondary | ICD-10-CM | POA: Diagnosis not present

## 2020-09-01 DIAGNOSIS — E785 Hyperlipidemia, unspecified: Secondary | ICD-10-CM | POA: Diagnosis not present

## 2020-09-01 DIAGNOSIS — J8410 Pulmonary fibrosis, unspecified: Secondary | ICD-10-CM | POA: Diagnosis not present

## 2020-09-03 DIAGNOSIS — J9621 Acute and chronic respiratory failure with hypoxia: Secondary | ICD-10-CM | POA: Diagnosis not present

## 2020-09-03 DIAGNOSIS — T86810 Lung transplant rejection: Secondary | ICD-10-CM | POA: Diagnosis not present

## 2020-09-03 DIAGNOSIS — Z006 Encounter for examination for normal comparison and control in clinical research program: Secondary | ICD-10-CM | POA: Diagnosis not present

## 2020-09-03 DIAGNOSIS — J449 Chronic obstructive pulmonary disease, unspecified: Secondary | ICD-10-CM | POA: Diagnosis not present

## 2020-09-03 DIAGNOSIS — I2510 Atherosclerotic heart disease of native coronary artery without angina pectoris: Secondary | ICD-10-CM | POA: Diagnosis not present

## 2020-09-03 DIAGNOSIS — J42 Unspecified chronic bronchitis: Secondary | ICD-10-CM | POA: Diagnosis not present

## 2020-09-03 DIAGNOSIS — J84112 Idiopathic pulmonary fibrosis: Secondary | ICD-10-CM | POA: Diagnosis not present

## 2020-09-03 DIAGNOSIS — I5022 Chronic systolic (congestive) heart failure: Secondary | ICD-10-CM | POA: Diagnosis not present

## 2020-09-03 DIAGNOSIS — Y830 Surgical operation with transplant of whole organ as the cause of abnormal reaction of the patient, or of later complication, without mention of misadventure at the time of the procedure: Secondary | ICD-10-CM | POA: Diagnosis not present

## 2020-09-03 DIAGNOSIS — I130 Hypertensive heart and chronic kidney disease with heart failure and stage 1 through stage 4 chronic kidney disease, or unspecified chronic kidney disease: Secondary | ICD-10-CM | POA: Diagnosis not present

## 2020-09-04 DIAGNOSIS — Z006 Encounter for examination for normal comparison and control in clinical research program: Secondary | ICD-10-CM | POA: Diagnosis not present

## 2020-09-04 DIAGNOSIS — J42 Unspecified chronic bronchitis: Secondary | ICD-10-CM | POA: Diagnosis not present

## 2020-09-04 DIAGNOSIS — T86810 Lung transplant rejection: Secondary | ICD-10-CM | POA: Diagnosis not present

## 2020-09-08 DIAGNOSIS — J84112 Idiopathic pulmonary fibrosis: Secondary | ICD-10-CM | POA: Diagnosis not present

## 2020-09-08 DIAGNOSIS — I130 Hypertensive heart and chronic kidney disease with heart failure and stage 1 through stage 4 chronic kidney disease, or unspecified chronic kidney disease: Secondary | ICD-10-CM | POA: Diagnosis not present

## 2020-09-08 DIAGNOSIS — I2510 Atherosclerotic heart disease of native coronary artery without angina pectoris: Secondary | ICD-10-CM | POA: Diagnosis not present

## 2020-09-08 DIAGNOSIS — J449 Chronic obstructive pulmonary disease, unspecified: Secondary | ICD-10-CM | POA: Diagnosis not present

## 2020-09-08 DIAGNOSIS — J9621 Acute and chronic respiratory failure with hypoxia: Secondary | ICD-10-CM | POA: Diagnosis not present

## 2020-09-08 DIAGNOSIS — I5022 Chronic systolic (congestive) heart failure: Secondary | ICD-10-CM | POA: Diagnosis not present

## 2020-09-10 DIAGNOSIS — T86810 Lung transplant rejection: Secondary | ICD-10-CM | POA: Diagnosis not present

## 2020-09-10 DIAGNOSIS — Z5181 Encounter for therapeutic drug level monitoring: Secondary | ICD-10-CM | POA: Diagnosis not present

## 2020-09-10 DIAGNOSIS — J42 Unspecified chronic bronchitis: Secondary | ICD-10-CM | POA: Diagnosis not present

## 2020-09-10 DIAGNOSIS — Z79899 Other long term (current) drug therapy: Secondary | ICD-10-CM | POA: Diagnosis not present

## 2020-09-15 DIAGNOSIS — C44329 Squamous cell carcinoma of skin of other parts of face: Secondary | ICD-10-CM | POA: Diagnosis not present

## 2020-09-17 DIAGNOSIS — I5022 Chronic systolic (congestive) heart failure: Secondary | ICD-10-CM | POA: Diagnosis not present

## 2020-09-21 DIAGNOSIS — J449 Chronic obstructive pulmonary disease, unspecified: Secondary | ICD-10-CM | POA: Diagnosis not present

## 2020-09-21 DIAGNOSIS — I5022 Chronic systolic (congestive) heart failure: Secondary | ICD-10-CM | POA: Diagnosis not present

## 2020-09-21 DIAGNOSIS — I2510 Atherosclerotic heart disease of native coronary artery without angina pectoris: Secondary | ICD-10-CM | POA: Diagnosis not present

## 2020-09-21 DIAGNOSIS — I130 Hypertensive heart and chronic kidney disease with heart failure and stage 1 through stage 4 chronic kidney disease, or unspecified chronic kidney disease: Secondary | ICD-10-CM | POA: Diagnosis not present

## 2020-09-21 DIAGNOSIS — J84112 Idiopathic pulmonary fibrosis: Secondary | ICD-10-CM | POA: Diagnosis not present

## 2020-09-21 DIAGNOSIS — J9621 Acute and chronic respiratory failure with hypoxia: Secondary | ICD-10-CM | POA: Diagnosis not present

## 2020-09-25 DIAGNOSIS — Z4502 Encounter for adjustment and management of automatic implantable cardiac defibrillator: Secondary | ICD-10-CM | POA: Diagnosis not present

## 2020-09-28 DIAGNOSIS — J84112 Idiopathic pulmonary fibrosis: Secondary | ICD-10-CM | POA: Diagnosis not present

## 2020-09-28 DIAGNOSIS — I130 Hypertensive heart and chronic kidney disease with heart failure and stage 1 through stage 4 chronic kidney disease, or unspecified chronic kidney disease: Secondary | ICD-10-CM | POA: Diagnosis not present

## 2020-09-28 DIAGNOSIS — J9621 Acute and chronic respiratory failure with hypoxia: Secondary | ICD-10-CM | POA: Diagnosis not present

## 2020-09-28 DIAGNOSIS — I2510 Atherosclerotic heart disease of native coronary artery without angina pectoris: Secondary | ICD-10-CM | POA: Diagnosis not present

## 2020-09-28 DIAGNOSIS — I5022 Chronic systolic (congestive) heart failure: Secondary | ICD-10-CM | POA: Diagnosis not present

## 2020-09-28 DIAGNOSIS — J449 Chronic obstructive pulmonary disease, unspecified: Secondary | ICD-10-CM | POA: Diagnosis not present

## 2020-09-30 DIAGNOSIS — Z8739 Personal history of other diseases of the musculoskeletal system and connective tissue: Secondary | ICD-10-CM | POA: Diagnosis not present

## 2020-09-30 DIAGNOSIS — Z7951 Long term (current) use of inhaled steroids: Secondary | ICD-10-CM | POA: Diagnosis not present

## 2020-09-30 DIAGNOSIS — J9621 Acute and chronic respiratory failure with hypoxia: Secondary | ICD-10-CM | POA: Diagnosis not present

## 2020-09-30 DIAGNOSIS — Z794 Long term (current) use of insulin: Secondary | ICD-10-CM | POA: Diagnosis not present

## 2020-09-30 DIAGNOSIS — K219 Gastro-esophageal reflux disease without esophagitis: Secondary | ICD-10-CM | POA: Diagnosis not present

## 2020-09-30 DIAGNOSIS — N183 Chronic kidney disease, stage 3 unspecified: Secondary | ICD-10-CM | POA: Diagnosis not present

## 2020-09-30 DIAGNOSIS — I447 Left bundle-branch block, unspecified: Secondary | ICD-10-CM | POA: Diagnosis not present

## 2020-09-30 DIAGNOSIS — Z8619 Personal history of other infectious and parasitic diseases: Secondary | ICD-10-CM | POA: Diagnosis not present

## 2020-09-30 DIAGNOSIS — Z87891 Personal history of nicotine dependence: Secondary | ICD-10-CM | POA: Diagnosis not present

## 2020-09-30 DIAGNOSIS — G4733 Obstructive sleep apnea (adult) (pediatric): Secondary | ICD-10-CM | POA: Diagnosis not present

## 2020-09-30 DIAGNOSIS — Z9981 Dependence on supplemental oxygen: Secondary | ICD-10-CM | POA: Diagnosis not present

## 2020-09-30 DIAGNOSIS — I2510 Atherosclerotic heart disease of native coronary artery without angina pectoris: Secondary | ICD-10-CM | POA: Diagnosis not present

## 2020-09-30 DIAGNOSIS — Z792 Long term (current) use of antibiotics: Secondary | ICD-10-CM | POA: Diagnosis not present

## 2020-09-30 DIAGNOSIS — E1122 Type 2 diabetes mellitus with diabetic chronic kidney disease: Secondary | ICD-10-CM | POA: Diagnosis not present

## 2020-09-30 DIAGNOSIS — E785 Hyperlipidemia, unspecified: Secondary | ICD-10-CM | POA: Diagnosis not present

## 2020-09-30 DIAGNOSIS — Z7901 Long term (current) use of anticoagulants: Secondary | ICD-10-CM | POA: Diagnosis not present

## 2020-09-30 DIAGNOSIS — J84112 Idiopathic pulmonary fibrosis: Secondary | ICD-10-CM | POA: Diagnosis not present

## 2020-09-30 DIAGNOSIS — N400 Enlarged prostate without lower urinary tract symptoms: Secondary | ICD-10-CM | POA: Diagnosis not present

## 2020-09-30 DIAGNOSIS — J449 Chronic obstructive pulmonary disease, unspecified: Secondary | ICD-10-CM | POA: Diagnosis not present

## 2020-09-30 DIAGNOSIS — D509 Iron deficiency anemia, unspecified: Secondary | ICD-10-CM | POA: Diagnosis not present

## 2020-09-30 DIAGNOSIS — Z85828 Personal history of other malignant neoplasm of skin: Secondary | ICD-10-CM | POA: Diagnosis not present

## 2020-09-30 DIAGNOSIS — Z7952 Long term (current) use of systemic steroids: Secondary | ICD-10-CM | POA: Diagnosis not present

## 2020-09-30 DIAGNOSIS — I130 Hypertensive heart and chronic kidney disease with heart failure and stage 1 through stage 4 chronic kidney disease, or unspecified chronic kidney disease: Secondary | ICD-10-CM | POA: Diagnosis not present

## 2020-09-30 DIAGNOSIS — Z8781 Personal history of (healed) traumatic fracture: Secondary | ICD-10-CM | POA: Diagnosis not present

## 2020-09-30 DIAGNOSIS — I5022 Chronic systolic (congestive) heart failure: Secondary | ICD-10-CM | POA: Diagnosis not present

## 2020-10-01 DIAGNOSIS — Z08 Encounter for follow-up examination after completed treatment for malignant neoplasm: Secondary | ICD-10-CM | POA: Diagnosis not present

## 2020-10-01 DIAGNOSIS — Z006 Encounter for examination for normal comparison and control in clinical research program: Secondary | ICD-10-CM | POA: Diagnosis not present

## 2020-10-01 DIAGNOSIS — D2270 Melanocytic nevi of unspecified lower limb, including hip: Secondary | ICD-10-CM | POA: Diagnosis not present

## 2020-10-01 DIAGNOSIS — L988 Other specified disorders of the skin and subcutaneous tissue: Secondary | ICD-10-CM | POA: Diagnosis not present

## 2020-10-01 DIAGNOSIS — J42 Unspecified chronic bronchitis: Secondary | ICD-10-CM | POA: Diagnosis not present

## 2020-10-01 DIAGNOSIS — D2260 Melanocytic nevi of unspecified upper limb, including shoulder: Secondary | ICD-10-CM | POA: Diagnosis not present

## 2020-10-01 DIAGNOSIS — L814 Other melanin hyperpigmentation: Secondary | ICD-10-CM | POA: Diagnosis not present

## 2020-10-01 DIAGNOSIS — L738 Other specified follicular disorders: Secondary | ICD-10-CM | POA: Diagnosis not present

## 2020-10-01 DIAGNOSIS — D225 Melanocytic nevi of trunk: Secondary | ICD-10-CM | POA: Diagnosis not present

## 2020-10-01 DIAGNOSIS — Z85828 Personal history of other malignant neoplasm of skin: Secondary | ICD-10-CM | POA: Diagnosis not present

## 2020-10-01 DIAGNOSIS — L570 Actinic keratosis: Secondary | ICD-10-CM | POA: Diagnosis not present

## 2020-10-01 DIAGNOSIS — L821 Other seborrheic keratosis: Secondary | ICD-10-CM | POA: Diagnosis not present

## 2020-10-01 DIAGNOSIS — T86810 Lung transplant rejection: Secondary | ICD-10-CM | POA: Diagnosis not present

## 2020-10-01 DIAGNOSIS — D849 Immunodeficiency, unspecified: Secondary | ICD-10-CM | POA: Diagnosis not present

## 2020-10-05 DIAGNOSIS — D509 Iron deficiency anemia, unspecified: Secondary | ICD-10-CM | POA: Diagnosis not present

## 2020-10-05 DIAGNOSIS — Z794 Long term (current) use of insulin: Secondary | ICD-10-CM | POA: Diagnosis not present

## 2020-10-05 DIAGNOSIS — D849 Immunodeficiency, unspecified: Secondary | ICD-10-CM | POA: Diagnosis not present

## 2020-10-05 DIAGNOSIS — Z5181 Encounter for therapeutic drug level monitoring: Secondary | ICD-10-CM | POA: Diagnosis not present

## 2020-10-05 DIAGNOSIS — J42 Unspecified chronic bronchitis: Secondary | ICD-10-CM | POA: Diagnosis not present

## 2020-10-05 DIAGNOSIS — J8410 Pulmonary fibrosis, unspecified: Secondary | ICD-10-CM | POA: Diagnosis not present

## 2020-10-05 DIAGNOSIS — E1165 Type 2 diabetes mellitus with hyperglycemia: Secondary | ICD-10-CM | POA: Diagnosis not present

## 2020-10-05 DIAGNOSIS — T86818 Other complications of lung transplant: Secondary | ICD-10-CM | POA: Diagnosis not present

## 2020-10-05 DIAGNOSIS — Z23 Encounter for immunization: Secondary | ICD-10-CM | POA: Diagnosis not present

## 2020-10-05 DIAGNOSIS — Z942 Lung transplant status: Secondary | ICD-10-CM | POA: Diagnosis not present

## 2020-10-05 DIAGNOSIS — Z4502 Encounter for adjustment and management of automatic implantable cardiac defibrillator: Secondary | ICD-10-CM | POA: Diagnosis not present

## 2020-10-05 DIAGNOSIS — T86810 Lung transplant rejection: Secondary | ICD-10-CM | POA: Diagnosis not present

## 2020-10-05 DIAGNOSIS — J984 Other disorders of lung: Secondary | ICD-10-CM | POA: Diagnosis not present

## 2020-10-05 DIAGNOSIS — D84821 Immunodeficiency due to drugs: Secondary | ICD-10-CM | POA: Diagnosis not present

## 2020-10-05 DIAGNOSIS — Z79899 Other long term (current) drug therapy: Secondary | ICD-10-CM | POA: Diagnosis not present

## 2020-10-07 DIAGNOSIS — I130 Hypertensive heart and chronic kidney disease with heart failure and stage 1 through stage 4 chronic kidney disease, or unspecified chronic kidney disease: Secondary | ICD-10-CM | POA: Diagnosis not present

## 2020-10-07 DIAGNOSIS — J9621 Acute and chronic respiratory failure with hypoxia: Secondary | ICD-10-CM | POA: Diagnosis not present

## 2020-10-07 DIAGNOSIS — I5022 Chronic systolic (congestive) heart failure: Secondary | ICD-10-CM | POA: Diagnosis not present

## 2020-10-07 DIAGNOSIS — J84112 Idiopathic pulmonary fibrosis: Secondary | ICD-10-CM | POA: Diagnosis not present

## 2020-10-07 DIAGNOSIS — I2510 Atherosclerotic heart disease of native coronary artery without angina pectoris: Secondary | ICD-10-CM | POA: Diagnosis not present

## 2020-10-07 DIAGNOSIS — J449 Chronic obstructive pulmonary disease, unspecified: Secondary | ICD-10-CM | POA: Diagnosis not present

## 2020-10-13 DIAGNOSIS — I5022 Chronic systolic (congestive) heart failure: Secondary | ICD-10-CM | POA: Diagnosis not present

## 2020-10-13 DIAGNOSIS — J9621 Acute and chronic respiratory failure with hypoxia: Secondary | ICD-10-CM | POA: Diagnosis not present

## 2020-10-13 DIAGNOSIS — I130 Hypertensive heart and chronic kidney disease with heart failure and stage 1 through stage 4 chronic kidney disease, or unspecified chronic kidney disease: Secondary | ICD-10-CM | POA: Diagnosis not present

## 2020-10-13 DIAGNOSIS — J84112 Idiopathic pulmonary fibrosis: Secondary | ICD-10-CM | POA: Diagnosis not present

## 2020-10-13 DIAGNOSIS — J449 Chronic obstructive pulmonary disease, unspecified: Secondary | ICD-10-CM | POA: Diagnosis not present

## 2020-10-13 DIAGNOSIS — I2510 Atherosclerotic heart disease of native coronary artery without angina pectoris: Secondary | ICD-10-CM | POA: Diagnosis not present

## 2020-10-20 DIAGNOSIS — I2510 Atherosclerotic heart disease of native coronary artery without angina pectoris: Secondary | ICD-10-CM | POA: Diagnosis not present

## 2020-10-20 DIAGNOSIS — I5022 Chronic systolic (congestive) heart failure: Secondary | ICD-10-CM | POA: Diagnosis not present

## 2020-10-20 DIAGNOSIS — J84112 Idiopathic pulmonary fibrosis: Secondary | ICD-10-CM | POA: Diagnosis not present

## 2020-10-20 DIAGNOSIS — J449 Chronic obstructive pulmonary disease, unspecified: Secondary | ICD-10-CM | POA: Diagnosis not present

## 2020-10-20 DIAGNOSIS — J9621 Acute and chronic respiratory failure with hypoxia: Secondary | ICD-10-CM | POA: Diagnosis not present

## 2020-10-20 DIAGNOSIS — I130 Hypertensive heart and chronic kidney disease with heart failure and stage 1 through stage 4 chronic kidney disease, or unspecified chronic kidney disease: Secondary | ICD-10-CM | POA: Diagnosis not present

## 2020-10-29 DIAGNOSIS — Z006 Encounter for examination for normal comparison and control in clinical research program: Secondary | ICD-10-CM | POA: Diagnosis not present

## 2020-10-29 DIAGNOSIS — T86810 Lung transplant rejection: Secondary | ICD-10-CM | POA: Diagnosis not present

## 2020-10-29 DIAGNOSIS — J42 Unspecified chronic bronchitis: Secondary | ICD-10-CM | POA: Diagnosis not present

## 2020-10-29 DIAGNOSIS — Y830 Surgical operation with transplant of whole organ as the cause of abnormal reaction of the patient, or of later complication, without mention of misadventure at the time of the procedure: Secondary | ICD-10-CM | POA: Diagnosis not present

## 2020-10-29 DIAGNOSIS — J449 Chronic obstructive pulmonary disease, unspecified: Secondary | ICD-10-CM | POA: Diagnosis not present

## 2020-10-30 DIAGNOSIS — T86810 Lung transplant rejection: Secondary | ICD-10-CM | POA: Diagnosis not present

## 2020-10-30 DIAGNOSIS — J42 Unspecified chronic bronchitis: Secondary | ICD-10-CM | POA: Diagnosis not present

## 2020-10-30 DIAGNOSIS — Z006 Encounter for examination for normal comparison and control in clinical research program: Secondary | ICD-10-CM | POA: Diagnosis not present

## 2020-11-26 DIAGNOSIS — J42 Unspecified chronic bronchitis: Secondary | ICD-10-CM | POA: Diagnosis not present

## 2020-11-26 DIAGNOSIS — Y830 Surgical operation with transplant of whole organ as the cause of abnormal reaction of the patient, or of later complication, without mention of misadventure at the time of the procedure: Secondary | ICD-10-CM | POA: Diagnosis not present

## 2020-11-26 DIAGNOSIS — Z9981 Dependence on supplemental oxygen: Secondary | ICD-10-CM | POA: Diagnosis not present

## 2020-11-26 DIAGNOSIS — Z006 Encounter for examination for normal comparison and control in clinical research program: Secondary | ICD-10-CM | POA: Diagnosis not present

## 2020-11-26 DIAGNOSIS — Z79899 Other long term (current) drug therapy: Secondary | ICD-10-CM | POA: Diagnosis not present

## 2020-11-26 DIAGNOSIS — T86810 Lung transplant rejection: Secondary | ICD-10-CM | POA: Diagnosis not present

## 2020-11-27 DIAGNOSIS — J42 Unspecified chronic bronchitis: Secondary | ICD-10-CM | POA: Diagnosis not present

## 2020-11-27 DIAGNOSIS — Z006 Encounter for examination for normal comparison and control in clinical research program: Secondary | ICD-10-CM | POA: Diagnosis not present

## 2020-11-27 DIAGNOSIS — T86810 Lung transplant rejection: Secondary | ICD-10-CM | POA: Diagnosis not present

## 2020-11-27 DIAGNOSIS — Y830 Surgical operation with transplant of whole organ as the cause of abnormal reaction of the patient, or of later complication, without mention of misadventure at the time of the procedure: Secondary | ICD-10-CM | POA: Diagnosis not present

## 2020-12-03 DIAGNOSIS — Z95810 Presence of automatic (implantable) cardiac defibrillator: Secondary | ICD-10-CM | POA: Diagnosis not present

## 2020-12-07 DIAGNOSIS — E1165 Type 2 diabetes mellitus with hyperglycemia: Secondary | ICD-10-CM | POA: Diagnosis not present

## 2020-12-07 DIAGNOSIS — Z794 Long term (current) use of insulin: Secondary | ICD-10-CM | POA: Diagnosis not present

## 2020-12-07 DIAGNOSIS — I10 Essential (primary) hypertension: Secondary | ICD-10-CM | POA: Diagnosis not present

## 2020-12-07 DIAGNOSIS — E782 Mixed hyperlipidemia: Secondary | ICD-10-CM | POA: Diagnosis not present

## 2020-12-24 DIAGNOSIS — R918 Other nonspecific abnormal finding of lung field: Secondary | ICD-10-CM | POA: Diagnosis not present

## 2020-12-24 DIAGNOSIS — Z942 Lung transplant status: Secondary | ICD-10-CM | POA: Diagnosis not present

## 2020-12-24 DIAGNOSIS — R0602 Shortness of breath: Secondary | ICD-10-CM | POA: Diagnosis not present

## 2020-12-24 DIAGNOSIS — E785 Hyperlipidemia, unspecified: Secondary | ICD-10-CM | POA: Diagnosis present

## 2020-12-24 DIAGNOSIS — K219 Gastro-esophageal reflux disease without esophagitis: Secondary | ICD-10-CM | POA: Diagnosis present

## 2020-12-24 DIAGNOSIS — D509 Iron deficiency anemia, unspecified: Secondary | ICD-10-CM | POA: Diagnosis present

## 2020-12-24 DIAGNOSIS — U071 COVID-19: Secondary | ICD-10-CM | POA: Diagnosis present

## 2020-12-24 DIAGNOSIS — I4891 Unspecified atrial fibrillation: Secondary | ICD-10-CM | POA: Diagnosis present

## 2020-12-24 DIAGNOSIS — D849 Immunodeficiency, unspecified: Secondary | ICD-10-CM | POA: Diagnosis not present

## 2020-12-24 DIAGNOSIS — J8410 Pulmonary fibrosis, unspecified: Secondary | ICD-10-CM | POA: Diagnosis present

## 2020-12-24 DIAGNOSIS — Z7901 Long term (current) use of anticoagulants: Secondary | ICD-10-CM | POA: Diagnosis not present

## 2020-12-24 DIAGNOSIS — I5043 Acute on chronic combined systolic (congestive) and diastolic (congestive) heart failure: Secondary | ICD-10-CM | POA: Diagnosis present

## 2020-12-24 DIAGNOSIS — G4733 Obstructive sleep apnea (adult) (pediatric): Secondary | ICD-10-CM | POA: Diagnosis present

## 2020-12-24 DIAGNOSIS — N184 Chronic kidney disease, stage 4 (severe): Secondary | ICD-10-CM | POA: Diagnosis present

## 2020-12-24 DIAGNOSIS — Z794 Long term (current) use of insulin: Secondary | ICD-10-CM | POA: Diagnosis not present

## 2020-12-24 DIAGNOSIS — E1122 Type 2 diabetes mellitus with diabetic chronic kidney disease: Secondary | ICD-10-CM | POA: Diagnosis present

## 2020-12-24 DIAGNOSIS — J42 Unspecified chronic bronchitis: Secondary | ICD-10-CM | POA: Diagnosis not present

## 2020-12-24 DIAGNOSIS — J9621 Acute and chronic respiratory failure with hypoxia: Secondary | ICD-10-CM | POA: Diagnosis present

## 2020-12-24 DIAGNOSIS — I130 Hypertensive heart and chronic kidney disease with heart failure and stage 1 through stage 4 chronic kidney disease, or unspecified chronic kidney disease: Secondary | ICD-10-CM | POA: Diagnosis present

## 2020-12-24 DIAGNOSIS — Z006 Encounter for examination for normal comparison and control in clinical research program: Secondary | ICD-10-CM | POA: Diagnosis not present

## 2020-12-24 DIAGNOSIS — Z87891 Personal history of nicotine dependence: Secondary | ICD-10-CM | POA: Diagnosis not present

## 2020-12-24 DIAGNOSIS — J1282 Pneumonia due to coronavirus disease 2019: Secondary | ICD-10-CM | POA: Diagnosis present

## 2020-12-24 DIAGNOSIS — Z7951 Long term (current) use of inhaled steroids: Secondary | ICD-10-CM | POA: Diagnosis not present

## 2020-12-24 DIAGNOSIS — R0609 Other forms of dyspnea: Secondary | ICD-10-CM | POA: Diagnosis not present

## 2020-12-24 DIAGNOSIS — T86810 Lung transplant rejection: Secondary | ICD-10-CM | POA: Diagnosis present

## 2020-12-24 DIAGNOSIS — I428 Other cardiomyopathies: Secondary | ICD-10-CM | POA: Diagnosis present

## 2020-12-24 DIAGNOSIS — N400 Enlarged prostate without lower urinary tract symptoms: Secondary | ICD-10-CM | POA: Diagnosis present

## 2020-12-25 ENCOUNTER — Telehealth: Payer: Self-pay

## 2020-12-25 DIAGNOSIS — T86810 Lung transplant rejection: Secondary | ICD-10-CM | POA: Diagnosis not present

## 2020-12-25 DIAGNOSIS — Z006 Encounter for examination for normal comparison and control in clinical research program: Secondary | ICD-10-CM | POA: Diagnosis not present

## 2020-12-25 DIAGNOSIS — J42 Unspecified chronic bronchitis: Secondary | ICD-10-CM | POA: Diagnosis not present

## 2020-12-25 NOTE — Telephone Encounter (Signed)
Received O2 order from [place] [facility]. Per patient, O2 ordered through [place]. Faxed order back letting them know-Toni

## 2020-12-26 DIAGNOSIS — U071 COVID-19: Secondary | ICD-10-CM | POA: Diagnosis not present

## 2020-12-26 DIAGNOSIS — Z942 Lung transplant status: Secondary | ICD-10-CM | POA: Diagnosis not present

## 2020-12-26 DIAGNOSIS — J1282 Pneumonia due to coronavirus disease 2019: Secondary | ICD-10-CM | POA: Diagnosis not present

## 2020-12-26 DIAGNOSIS — J9621 Acute and chronic respiratory failure with hypoxia: Secondary | ICD-10-CM | POA: Diagnosis not present

## 2020-12-26 DIAGNOSIS — R0602 Shortness of breath: Secondary | ICD-10-CM | POA: Diagnosis not present

## 2020-12-27 DIAGNOSIS — I5043 Acute on chronic combined systolic (congestive) and diastolic (congestive) heart failure: Secondary | ICD-10-CM | POA: Diagnosis present

## 2020-12-27 DIAGNOSIS — G4733 Obstructive sleep apnea (adult) (pediatric): Secondary | ICD-10-CM | POA: Diagnosis present

## 2020-12-27 DIAGNOSIS — J1282 Pneumonia due to coronavirus disease 2019: Secondary | ICD-10-CM | POA: Diagnosis present

## 2020-12-27 DIAGNOSIS — Z794 Long term (current) use of insulin: Secondary | ICD-10-CM | POA: Diagnosis not present

## 2020-12-27 DIAGNOSIS — E1122 Type 2 diabetes mellitus with diabetic chronic kidney disease: Secondary | ICD-10-CM | POA: Diagnosis present

## 2020-12-27 DIAGNOSIS — N184 Chronic kidney disease, stage 4 (severe): Secondary | ICD-10-CM | POA: Diagnosis present

## 2020-12-27 DIAGNOSIS — I130 Hypertensive heart and chronic kidney disease with heart failure and stage 1 through stage 4 chronic kidney disease, or unspecified chronic kidney disease: Secondary | ICD-10-CM | POA: Diagnosis present

## 2020-12-27 DIAGNOSIS — I4891 Unspecified atrial fibrillation: Secondary | ICD-10-CM | POA: Diagnosis present

## 2020-12-27 DIAGNOSIS — N400 Enlarged prostate without lower urinary tract symptoms: Secondary | ICD-10-CM | POA: Diagnosis present

## 2020-12-27 DIAGNOSIS — Z942 Lung transplant status: Secondary | ICD-10-CM | POA: Diagnosis not present

## 2020-12-27 DIAGNOSIS — R0602 Shortness of breath: Secondary | ICD-10-CM | POA: Diagnosis not present

## 2020-12-27 DIAGNOSIS — U071 COVID-19: Secondary | ICD-10-CM | POA: Diagnosis present

## 2020-12-27 DIAGNOSIS — J8410 Pulmonary fibrosis, unspecified: Secondary | ICD-10-CM | POA: Diagnosis present

## 2020-12-27 DIAGNOSIS — Z7901 Long term (current) use of anticoagulants: Secondary | ICD-10-CM | POA: Diagnosis not present

## 2020-12-27 DIAGNOSIS — E785 Hyperlipidemia, unspecified: Secondary | ICD-10-CM | POA: Diagnosis present

## 2020-12-27 DIAGNOSIS — K219 Gastro-esophageal reflux disease without esophagitis: Secondary | ICD-10-CM | POA: Diagnosis present

## 2020-12-27 DIAGNOSIS — D509 Iron deficiency anemia, unspecified: Secondary | ICD-10-CM | POA: Diagnosis present

## 2020-12-27 DIAGNOSIS — R918 Other nonspecific abnormal finding of lung field: Secondary | ICD-10-CM | POA: Diagnosis not present

## 2020-12-27 DIAGNOSIS — Z7951 Long term (current) use of inhaled steroids: Secondary | ICD-10-CM | POA: Diagnosis not present

## 2020-12-27 DIAGNOSIS — Z006 Encounter for examination for normal comparison and control in clinical research program: Secondary | ICD-10-CM | POA: Diagnosis not present

## 2020-12-27 DIAGNOSIS — I428 Other cardiomyopathies: Secondary | ICD-10-CM | POA: Diagnosis present

## 2020-12-27 DIAGNOSIS — J9621 Acute and chronic respiratory failure with hypoxia: Secondary | ICD-10-CM | POA: Diagnosis present

## 2020-12-27 DIAGNOSIS — T86810 Lung transplant rejection: Secondary | ICD-10-CM | POA: Diagnosis present

## 2020-12-27 DIAGNOSIS — D849 Immunodeficiency, unspecified: Secondary | ICD-10-CM | POA: Diagnosis not present

## 2020-12-27 DIAGNOSIS — Z87891 Personal history of nicotine dependence: Secondary | ICD-10-CM | POA: Diagnosis not present

## 2020-12-27 DIAGNOSIS — R0609 Other forms of dyspnea: Secondary | ICD-10-CM | POA: Diagnosis not present

## 2020-12-31 DIAGNOSIS — N400 Enlarged prostate without lower urinary tract symptoms: Secondary | ICD-10-CM | POA: Diagnosis not present

## 2020-12-31 DIAGNOSIS — E1122 Type 2 diabetes mellitus with diabetic chronic kidney disease: Secondary | ICD-10-CM | POA: Diagnosis not present

## 2020-12-31 DIAGNOSIS — J8410 Pulmonary fibrosis, unspecified: Secondary | ICD-10-CM | POA: Diagnosis not present

## 2020-12-31 DIAGNOSIS — Z794 Long term (current) use of insulin: Secondary | ICD-10-CM | POA: Diagnosis not present

## 2020-12-31 DIAGNOSIS — Z7951 Long term (current) use of inhaled steroids: Secondary | ICD-10-CM | POA: Diagnosis not present

## 2020-12-31 DIAGNOSIS — E785 Hyperlipidemia, unspecified: Secondary | ICD-10-CM | POA: Diagnosis not present

## 2020-12-31 DIAGNOSIS — I4891 Unspecified atrial fibrillation: Secondary | ICD-10-CM | POA: Diagnosis not present

## 2020-12-31 DIAGNOSIS — U071 COVID-19: Secondary | ICD-10-CM | POA: Diagnosis not present

## 2020-12-31 DIAGNOSIS — J440 Chronic obstructive pulmonary disease with acute lower respiratory infection: Secondary | ICD-10-CM | POA: Diagnosis not present

## 2020-12-31 DIAGNOSIS — F32A Depression, unspecified: Secondary | ICD-10-CM | POA: Diagnosis not present

## 2020-12-31 DIAGNOSIS — K219 Gastro-esophageal reflux disease without esophagitis: Secondary | ICD-10-CM | POA: Diagnosis not present

## 2020-12-31 DIAGNOSIS — J9621 Acute and chronic respiratory failure with hypoxia: Secondary | ICD-10-CM | POA: Diagnosis not present

## 2020-12-31 DIAGNOSIS — I5023 Acute on chronic systolic (congestive) heart failure: Secondary | ICD-10-CM | POA: Diagnosis not present

## 2020-12-31 DIAGNOSIS — G4733 Obstructive sleep apnea (adult) (pediatric): Secondary | ICD-10-CM | POA: Diagnosis not present

## 2020-12-31 DIAGNOSIS — I130 Hypertensive heart and chronic kidney disease with heart failure and stage 1 through stage 4 chronic kidney disease, or unspecified chronic kidney disease: Secondary | ICD-10-CM | POA: Diagnosis not present

## 2020-12-31 DIAGNOSIS — I428 Other cardiomyopathies: Secondary | ICD-10-CM | POA: Diagnosis not present

## 2020-12-31 DIAGNOSIS — Z942 Lung transplant status: Secondary | ICD-10-CM | POA: Diagnosis not present

## 2020-12-31 DIAGNOSIS — K5900 Constipation, unspecified: Secondary | ICD-10-CM | POA: Diagnosis not present

## 2020-12-31 DIAGNOSIS — N184 Chronic kidney disease, stage 4 (severe): Secondary | ICD-10-CM | POA: Diagnosis not present

## 2020-12-31 DIAGNOSIS — J1282 Pneumonia due to coronavirus disease 2019: Secondary | ICD-10-CM | POA: Diagnosis not present

## 2020-12-31 DIAGNOSIS — D509 Iron deficiency anemia, unspecified: Secondary | ICD-10-CM | POA: Diagnosis not present

## 2020-12-31 DIAGNOSIS — Z95810 Presence of automatic (implantable) cardiac defibrillator: Secondary | ICD-10-CM | POA: Diagnosis not present

## 2020-12-31 DIAGNOSIS — M609 Myositis, unspecified: Secondary | ICD-10-CM | POA: Diagnosis not present

## 2020-12-31 DIAGNOSIS — Z7952 Long term (current) use of systemic steroids: Secondary | ICD-10-CM | POA: Diagnosis not present

## 2021-01-04 ENCOUNTER — Telehealth: Payer: Self-pay

## 2021-01-04 NOTE — Telephone Encounter (Signed)
Gave verbal order for physical therapy [name] for [facility] [phone] Physical  therapy 2 times a 2 week  and once a week for 2 week

## 2021-01-04 NOTE — Telephone Encounter (Signed)
Try to call  physical [phone] left message on voicemail no answer

## 2021-01-05 DIAGNOSIS — I130 Hypertensive heart and chronic kidney disease with heart failure and stage 1 through stage 4 chronic kidney disease, or unspecified chronic kidney disease: Secondary | ICD-10-CM | POA: Diagnosis not present

## 2021-01-05 DIAGNOSIS — U071 COVID-19: Secondary | ICD-10-CM | POA: Diagnosis not present

## 2021-01-05 DIAGNOSIS — E1122 Type 2 diabetes mellitus with diabetic chronic kidney disease: Secondary | ICD-10-CM | POA: Diagnosis not present

## 2021-01-05 DIAGNOSIS — J440 Chronic obstructive pulmonary disease with acute lower respiratory infection: Secondary | ICD-10-CM | POA: Diagnosis not present

## 2021-01-05 DIAGNOSIS — J9621 Acute and chronic respiratory failure with hypoxia: Secondary | ICD-10-CM | POA: Diagnosis not present

## 2021-01-05 DIAGNOSIS — J1282 Pneumonia due to coronavirus disease 2019: Secondary | ICD-10-CM | POA: Diagnosis not present

## 2021-01-07 DIAGNOSIS — U071 COVID-19: Secondary | ICD-10-CM | POA: Diagnosis not present

## 2021-01-07 DIAGNOSIS — I130 Hypertensive heart and chronic kidney disease with heart failure and stage 1 through stage 4 chronic kidney disease, or unspecified chronic kidney disease: Secondary | ICD-10-CM | POA: Diagnosis not present

## 2021-01-07 DIAGNOSIS — J1282 Pneumonia due to coronavirus disease 2019: Secondary | ICD-10-CM | POA: Diagnosis not present

## 2021-01-07 DIAGNOSIS — J440 Chronic obstructive pulmonary disease with acute lower respiratory infection: Secondary | ICD-10-CM | POA: Diagnosis not present

## 2021-01-07 DIAGNOSIS — E1122 Type 2 diabetes mellitus with diabetic chronic kidney disease: Secondary | ICD-10-CM | POA: Diagnosis not present

## 2021-01-07 DIAGNOSIS — J9621 Acute and chronic respiratory failure with hypoxia: Secondary | ICD-10-CM | POA: Diagnosis not present

## 2021-01-11 DIAGNOSIS — J1282 Pneumonia due to coronavirus disease 2019: Secondary | ICD-10-CM | POA: Diagnosis not present

## 2021-01-11 DIAGNOSIS — I130 Hypertensive heart and chronic kidney disease with heart failure and stage 1 through stage 4 chronic kidney disease, or unspecified chronic kidney disease: Secondary | ICD-10-CM | POA: Diagnosis not present

## 2021-01-11 DIAGNOSIS — E1122 Type 2 diabetes mellitus with diabetic chronic kidney disease: Secondary | ICD-10-CM | POA: Diagnosis not present

## 2021-01-11 DIAGNOSIS — U071 COVID-19: Secondary | ICD-10-CM | POA: Diagnosis not present

## 2021-01-11 DIAGNOSIS — J440 Chronic obstructive pulmonary disease with acute lower respiratory infection: Secondary | ICD-10-CM | POA: Diagnosis not present

## 2021-01-11 DIAGNOSIS — J9621 Acute and chronic respiratory failure with hypoxia: Secondary | ICD-10-CM | POA: Diagnosis not present

## 2021-01-12 ENCOUNTER — Telehealth: Payer: Self-pay

## 2021-01-12 DIAGNOSIS — J9621 Acute and chronic respiratory failure with hypoxia: Secondary | ICD-10-CM | POA: Diagnosis not present

## 2021-01-12 DIAGNOSIS — J440 Chronic obstructive pulmonary disease with acute lower respiratory infection: Secondary | ICD-10-CM | POA: Diagnosis not present

## 2021-01-12 DIAGNOSIS — I130 Hypertensive heart and chronic kidney disease with heart failure and stage 1 through stage 4 chronic kidney disease, or unspecified chronic kidney disease: Secondary | ICD-10-CM | POA: Diagnosis not present

## 2021-01-12 DIAGNOSIS — J1282 Pneumonia due to coronavirus disease 2019: Secondary | ICD-10-CM | POA: Diagnosis not present

## 2021-01-12 DIAGNOSIS — E1122 Type 2 diabetes mellitus with diabetic chronic kidney disease: Secondary | ICD-10-CM | POA: Diagnosis not present

## 2021-01-12 DIAGNOSIS — U071 COVID-19: Secondary | ICD-10-CM | POA: Diagnosis not present

## 2021-01-12 NOTE — Telephone Encounter (Signed)
Order for [place] signed & faxed back to [phone]. Sent to be scanned-Toni

## 2021-01-14 DIAGNOSIS — E1122 Type 2 diabetes mellitus with diabetic chronic kidney disease: Secondary | ICD-10-CM | POA: Diagnosis not present

## 2021-01-14 DIAGNOSIS — J1282 Pneumonia due to coronavirus disease 2019: Secondary | ICD-10-CM | POA: Diagnosis not present

## 2021-01-14 DIAGNOSIS — U071 COVID-19: Secondary | ICD-10-CM | POA: Diagnosis not present

## 2021-01-14 DIAGNOSIS — I130 Hypertensive heart and chronic kidney disease with heart failure and stage 1 through stage 4 chronic kidney disease, or unspecified chronic kidney disease: Secondary | ICD-10-CM | POA: Diagnosis not present

## 2021-01-14 DIAGNOSIS — J9621 Acute and chronic respiratory failure with hypoxia: Secondary | ICD-10-CM | POA: Diagnosis not present

## 2021-01-14 DIAGNOSIS — J440 Chronic obstructive pulmonary disease with acute lower respiratory infection: Secondary | ICD-10-CM | POA: Diagnosis not present

## 2021-01-18 DIAGNOSIS — I130 Hypertensive heart and chronic kidney disease with heart failure and stage 1 through stage 4 chronic kidney disease, or unspecified chronic kidney disease: Secondary | ICD-10-CM | POA: Diagnosis not present

## 2021-01-18 DIAGNOSIS — J1282 Pneumonia due to coronavirus disease 2019: Secondary | ICD-10-CM | POA: Diagnosis not present

## 2021-01-18 DIAGNOSIS — E1122 Type 2 diabetes mellitus with diabetic chronic kidney disease: Secondary | ICD-10-CM | POA: Diagnosis not present

## 2021-01-18 DIAGNOSIS — J440 Chronic obstructive pulmonary disease with acute lower respiratory infection: Secondary | ICD-10-CM | POA: Diagnosis not present

## 2021-01-18 DIAGNOSIS — U071 COVID-19: Secondary | ICD-10-CM | POA: Diagnosis not present

## 2021-01-18 DIAGNOSIS — J9621 Acute and chronic respiratory failure with hypoxia: Secondary | ICD-10-CM | POA: Diagnosis not present

## 2021-01-19 DIAGNOSIS — I130 Hypertensive heart and chronic kidney disease with heart failure and stage 1 through stage 4 chronic kidney disease, or unspecified chronic kidney disease: Secondary | ICD-10-CM | POA: Diagnosis not present

## 2021-01-19 DIAGNOSIS — J9621 Acute and chronic respiratory failure with hypoxia: Secondary | ICD-10-CM | POA: Diagnosis not present

## 2021-01-19 DIAGNOSIS — J440 Chronic obstructive pulmonary disease with acute lower respiratory infection: Secondary | ICD-10-CM | POA: Diagnosis not present

## 2021-01-19 DIAGNOSIS — J1282 Pneumonia due to coronavirus disease 2019: Secondary | ICD-10-CM | POA: Diagnosis not present

## 2021-01-19 DIAGNOSIS — E1122 Type 2 diabetes mellitus with diabetic chronic kidney disease: Secondary | ICD-10-CM | POA: Diagnosis not present

## 2021-01-19 DIAGNOSIS — U071 COVID-19: Secondary | ICD-10-CM | POA: Diagnosis not present

## 2021-01-19 DIAGNOSIS — D509 Iron deficiency anemia, unspecified: Secondary | ICD-10-CM | POA: Diagnosis not present

## 2021-01-21 DIAGNOSIS — J42 Unspecified chronic bronchitis: Secondary | ICD-10-CM | POA: Diagnosis not present

## 2021-01-21 DIAGNOSIS — T86810 Lung transplant rejection: Secondary | ICD-10-CM | POA: Diagnosis not present

## 2021-01-21 DIAGNOSIS — Z79899 Other long term (current) drug therapy: Secondary | ICD-10-CM | POA: Diagnosis not present

## 2021-01-21 DIAGNOSIS — Z006 Encounter for examination for normal comparison and control in clinical research program: Secondary | ICD-10-CM | POA: Diagnosis not present

## 2021-01-22 DIAGNOSIS — Y830 Surgical operation with transplant of whole organ as the cause of abnormal reaction of the patient, or of later complication, without mention of misadventure at the time of the procedure: Secondary | ICD-10-CM | POA: Diagnosis not present

## 2021-01-22 DIAGNOSIS — T86810 Lung transplant rejection: Secondary | ICD-10-CM | POA: Diagnosis not present

## 2021-01-22 DIAGNOSIS — Z006 Encounter for examination for normal comparison and control in clinical research program: Secondary | ICD-10-CM | POA: Diagnosis not present

## 2021-01-22 DIAGNOSIS — J42 Unspecified chronic bronchitis: Secondary | ICD-10-CM | POA: Diagnosis not present

## 2021-01-26 DIAGNOSIS — D509 Iron deficiency anemia, unspecified: Secondary | ICD-10-CM | POA: Diagnosis not present

## 2021-01-26 DIAGNOSIS — I130 Hypertensive heart and chronic kidney disease with heart failure and stage 1 through stage 4 chronic kidney disease, or unspecified chronic kidney disease: Secondary | ICD-10-CM | POA: Diagnosis not present

## 2021-01-26 DIAGNOSIS — U071 COVID-19: Secondary | ICD-10-CM | POA: Diagnosis not present

## 2021-01-26 DIAGNOSIS — J440 Chronic obstructive pulmonary disease with acute lower respiratory infection: Secondary | ICD-10-CM | POA: Diagnosis not present

## 2021-01-26 DIAGNOSIS — J9621 Acute and chronic respiratory failure with hypoxia: Secondary | ICD-10-CM | POA: Diagnosis not present

## 2021-01-26 DIAGNOSIS — J1282 Pneumonia due to coronavirus disease 2019: Secondary | ICD-10-CM | POA: Diagnosis not present

## 2021-01-26 DIAGNOSIS — E1122 Type 2 diabetes mellitus with diabetic chronic kidney disease: Secondary | ICD-10-CM | POA: Diagnosis not present

## 2021-01-28 DIAGNOSIS — J440 Chronic obstructive pulmonary disease with acute lower respiratory infection: Secondary | ICD-10-CM | POA: Diagnosis not present

## 2021-01-28 DIAGNOSIS — J1282 Pneumonia due to coronavirus disease 2019: Secondary | ICD-10-CM | POA: Diagnosis not present

## 2021-01-28 DIAGNOSIS — E1122 Type 2 diabetes mellitus with diabetic chronic kidney disease: Secondary | ICD-10-CM | POA: Diagnosis not present

## 2021-01-28 DIAGNOSIS — U071 COVID-19: Secondary | ICD-10-CM | POA: Diagnosis not present

## 2021-01-28 DIAGNOSIS — I130 Hypertensive heart and chronic kidney disease with heart failure and stage 1 through stage 4 chronic kidney disease, or unspecified chronic kidney disease: Secondary | ICD-10-CM | POA: Diagnosis not present

## 2021-01-28 DIAGNOSIS — J9621 Acute and chronic respiratory failure with hypoxia: Secondary | ICD-10-CM | POA: Diagnosis not present

## 2021-01-30 DIAGNOSIS — M609 Myositis, unspecified: Secondary | ICD-10-CM | POA: Diagnosis not present

## 2021-01-30 DIAGNOSIS — Z95810 Presence of automatic (implantable) cardiac defibrillator: Secondary | ICD-10-CM | POA: Diagnosis not present

## 2021-01-30 DIAGNOSIS — J440 Chronic obstructive pulmonary disease with acute lower respiratory infection: Secondary | ICD-10-CM | POA: Diagnosis not present

## 2021-01-30 DIAGNOSIS — K5900 Constipation, unspecified: Secondary | ICD-10-CM | POA: Diagnosis not present

## 2021-01-30 DIAGNOSIS — I5023 Acute on chronic systolic (congestive) heart failure: Secondary | ICD-10-CM | POA: Diagnosis not present

## 2021-01-30 DIAGNOSIS — Z942 Lung transplant status: Secondary | ICD-10-CM | POA: Diagnosis not present

## 2021-01-30 DIAGNOSIS — N400 Enlarged prostate without lower urinary tract symptoms: Secondary | ICD-10-CM | POA: Diagnosis not present

## 2021-01-30 DIAGNOSIS — K219 Gastro-esophageal reflux disease without esophagitis: Secondary | ICD-10-CM | POA: Diagnosis not present

## 2021-01-30 DIAGNOSIS — D509 Iron deficiency anemia, unspecified: Secondary | ICD-10-CM | POA: Diagnosis not present

## 2021-01-30 DIAGNOSIS — F32A Depression, unspecified: Secondary | ICD-10-CM | POA: Diagnosis not present

## 2021-01-30 DIAGNOSIS — G4733 Obstructive sleep apnea (adult) (pediatric): Secondary | ICD-10-CM | POA: Diagnosis not present

## 2021-01-30 DIAGNOSIS — I4891 Unspecified atrial fibrillation: Secondary | ICD-10-CM | POA: Diagnosis not present

## 2021-01-30 DIAGNOSIS — J9621 Acute and chronic respiratory failure with hypoxia: Secondary | ICD-10-CM | POA: Diagnosis not present

## 2021-01-30 DIAGNOSIS — Z794 Long term (current) use of insulin: Secondary | ICD-10-CM | POA: Diagnosis not present

## 2021-01-30 DIAGNOSIS — Z7952 Long term (current) use of systemic steroids: Secondary | ICD-10-CM | POA: Diagnosis not present

## 2021-01-30 DIAGNOSIS — U071 COVID-19: Secondary | ICD-10-CM | POA: Diagnosis not present

## 2021-01-30 DIAGNOSIS — E785 Hyperlipidemia, unspecified: Secondary | ICD-10-CM | POA: Diagnosis not present

## 2021-01-30 DIAGNOSIS — J1282 Pneumonia due to coronavirus disease 2019: Secondary | ICD-10-CM | POA: Diagnosis not present

## 2021-01-30 DIAGNOSIS — I130 Hypertensive heart and chronic kidney disease with heart failure and stage 1 through stage 4 chronic kidney disease, or unspecified chronic kidney disease: Secondary | ICD-10-CM | POA: Diagnosis not present

## 2021-01-30 DIAGNOSIS — N184 Chronic kidney disease, stage 4 (severe): Secondary | ICD-10-CM | POA: Diagnosis not present

## 2021-01-30 DIAGNOSIS — J8410 Pulmonary fibrosis, unspecified: Secondary | ICD-10-CM | POA: Diagnosis not present

## 2021-01-30 DIAGNOSIS — E1122 Type 2 diabetes mellitus with diabetic chronic kidney disease: Secondary | ICD-10-CM | POA: Diagnosis not present

## 2021-01-30 DIAGNOSIS — Z7951 Long term (current) use of inhaled steroids: Secondary | ICD-10-CM | POA: Diagnosis not present

## 2021-01-30 DIAGNOSIS — I428 Other cardiomyopathies: Secondary | ICD-10-CM | POA: Diagnosis not present

## 2021-02-02 DIAGNOSIS — R454 Irritability and anger: Secondary | ICD-10-CM | POA: Diagnosis not present

## 2021-02-02 DIAGNOSIS — K219 Gastro-esophageal reflux disease without esophagitis: Secondary | ICD-10-CM | POA: Diagnosis not present

## 2021-02-02 DIAGNOSIS — R918 Other nonspecific abnormal finding of lung field: Secondary | ICD-10-CM | POA: Diagnosis not present

## 2021-02-02 DIAGNOSIS — G4733 Obstructive sleep apnea (adult) (pediatric): Secondary | ICD-10-CM | POA: Diagnosis not present

## 2021-02-02 DIAGNOSIS — N400 Enlarged prostate without lower urinary tract symptoms: Secondary | ICD-10-CM | POA: Diagnosis not present

## 2021-02-02 DIAGNOSIS — Z79899 Other long term (current) drug therapy: Secondary | ICD-10-CM | POA: Diagnosis not present

## 2021-02-02 DIAGNOSIS — Z5181 Encounter for therapeutic drug level monitoring: Secondary | ICD-10-CM | POA: Diagnosis not present

## 2021-02-02 DIAGNOSIS — U099 Post covid-19 condition, unspecified: Secondary | ICD-10-CM | POA: Diagnosis not present

## 2021-02-02 DIAGNOSIS — Z794 Long term (current) use of insulin: Secondary | ICD-10-CM | POA: Diagnosis not present

## 2021-02-02 DIAGNOSIS — N184 Chronic kidney disease, stage 4 (severe): Secondary | ICD-10-CM | POA: Diagnosis not present

## 2021-02-02 DIAGNOSIS — J42 Unspecified chronic bronchitis: Secondary | ICD-10-CM | POA: Diagnosis not present

## 2021-02-02 DIAGNOSIS — R059 Cough, unspecified: Secondary | ICD-10-CM | POA: Diagnosis not present

## 2021-02-02 DIAGNOSIS — Z4824 Encounter for aftercare following lung transplant: Secondary | ICD-10-CM | POA: Diagnosis not present

## 2021-02-02 DIAGNOSIS — D849 Immunodeficiency, unspecified: Secondary | ICD-10-CM | POA: Diagnosis not present

## 2021-02-02 DIAGNOSIS — I130 Hypertensive heart and chronic kidney disease with heart failure and stage 1 through stage 4 chronic kidney disease, or unspecified chronic kidney disease: Secondary | ICD-10-CM | POA: Diagnosis not present

## 2021-02-02 DIAGNOSIS — E1122 Type 2 diabetes mellitus with diabetic chronic kidney disease: Secondary | ICD-10-CM | POA: Diagnosis not present

## 2021-02-02 DIAGNOSIS — I2510 Atherosclerotic heart disease of native coronary artery without angina pectoris: Secondary | ICD-10-CM | POA: Diagnosis not present

## 2021-02-02 DIAGNOSIS — Z7952 Long term (current) use of systemic steroids: Secondary | ICD-10-CM | POA: Diagnosis not present

## 2021-02-02 DIAGNOSIS — Z942 Lung transplant status: Secondary | ICD-10-CM | POA: Diagnosis not present

## 2021-02-02 DIAGNOSIS — I509 Heart failure, unspecified: Secondary | ICD-10-CM | POA: Diagnosis not present

## 2021-02-02 DIAGNOSIS — D84821 Immunodeficiency due to drugs: Secondary | ICD-10-CM | POA: Diagnosis not present

## 2021-02-02 DIAGNOSIS — T86818 Other complications of lung transplant: Secondary | ICD-10-CM | POA: Diagnosis not present

## 2021-02-02 DIAGNOSIS — E785 Hyperlipidemia, unspecified: Secondary | ICD-10-CM | POA: Diagnosis not present

## 2021-02-03 ENCOUNTER — Telehealth: Payer: Self-pay

## 2021-02-03 NOTE — Telephone Encounter (Signed)
[place] called that [phone] gave verbal  order for PT once a week for 3 weeks and also advised her that pt need follow up appt and also gave [facility] staff message that pt need appt

## 2021-02-04 DIAGNOSIS — I130 Hypertensive heart and chronic kidney disease with heart failure and stage 1 through stage 4 chronic kidney disease, or unspecified chronic kidney disease: Secondary | ICD-10-CM | POA: Diagnosis not present

## 2021-02-04 DIAGNOSIS — J1282 Pneumonia due to coronavirus disease 2019: Secondary | ICD-10-CM | POA: Diagnosis not present

## 2021-02-04 DIAGNOSIS — U071 COVID-19: Secondary | ICD-10-CM | POA: Diagnosis not present

## 2021-02-04 DIAGNOSIS — E1122 Type 2 diabetes mellitus with diabetic chronic kidney disease: Secondary | ICD-10-CM | POA: Diagnosis not present

## 2021-02-04 DIAGNOSIS — J440 Chronic obstructive pulmonary disease with acute lower respiratory infection: Secondary | ICD-10-CM | POA: Diagnosis not present

## 2021-02-04 DIAGNOSIS — J9621 Acute and chronic respiratory failure with hypoxia: Secondary | ICD-10-CM | POA: Diagnosis not present

## 2021-02-05 DIAGNOSIS — D509 Iron deficiency anemia, unspecified: Secondary | ICD-10-CM | POA: Diagnosis not present

## 2021-02-09 ENCOUNTER — Telehealth: Payer: Self-pay

## 2021-02-09 DIAGNOSIS — J1282 Pneumonia due to coronavirus disease 2019: Secondary | ICD-10-CM | POA: Diagnosis not present

## 2021-02-09 DIAGNOSIS — E1122 Type 2 diabetes mellitus with diabetic chronic kidney disease: Secondary | ICD-10-CM | POA: Diagnosis not present

## 2021-02-09 DIAGNOSIS — I130 Hypertensive heart and chronic kidney disease with heart failure and stage 1 through stage 4 chronic kidney disease, or unspecified chronic kidney disease: Secondary | ICD-10-CM | POA: Diagnosis not present

## 2021-02-09 DIAGNOSIS — U071 COVID-19: Secondary | ICD-10-CM | POA: Diagnosis not present

## 2021-02-09 DIAGNOSIS — J9621 Acute and chronic respiratory failure with hypoxia: Secondary | ICD-10-CM | POA: Diagnosis not present

## 2021-02-09 DIAGNOSIS — J440 Chronic obstructive pulmonary disease with acute lower respiratory infection: Secondary | ICD-10-CM | POA: Diagnosis not present

## 2021-02-09 NOTE — Telephone Encounter (Signed)
[place] paperwork signed and faxed back to [phone], then sent to be scanned-Toni

## 2021-02-10 DIAGNOSIS — I130 Hypertensive heart and chronic kidney disease with heart failure and stage 1 through stage 4 chronic kidney disease, or unspecified chronic kidney disease: Secondary | ICD-10-CM | POA: Diagnosis not present

## 2021-02-10 DIAGNOSIS — J9621 Acute and chronic respiratory failure with hypoxia: Secondary | ICD-10-CM | POA: Diagnosis not present

## 2021-02-10 DIAGNOSIS — J440 Chronic obstructive pulmonary disease with acute lower respiratory infection: Secondary | ICD-10-CM | POA: Diagnosis not present

## 2021-02-10 DIAGNOSIS — J1282 Pneumonia due to coronavirus disease 2019: Secondary | ICD-10-CM | POA: Diagnosis not present

## 2021-02-10 DIAGNOSIS — E1122 Type 2 diabetes mellitus with diabetic chronic kidney disease: Secondary | ICD-10-CM | POA: Diagnosis not present

## 2021-02-10 DIAGNOSIS — U071 COVID-19: Secondary | ICD-10-CM | POA: Diagnosis not present

## 2021-02-12 DIAGNOSIS — D509 Iron deficiency anemia, unspecified: Secondary | ICD-10-CM | POA: Diagnosis not present

## 2021-02-15 DIAGNOSIS — J440 Chronic obstructive pulmonary disease with acute lower respiratory infection: Secondary | ICD-10-CM | POA: Diagnosis not present

## 2021-02-15 DIAGNOSIS — J9621 Acute and chronic respiratory failure with hypoxia: Secondary | ICD-10-CM | POA: Diagnosis not present

## 2021-02-15 DIAGNOSIS — J1282 Pneumonia due to coronavirus disease 2019: Secondary | ICD-10-CM | POA: Diagnosis not present

## 2021-02-15 DIAGNOSIS — U071 COVID-19: Secondary | ICD-10-CM | POA: Diagnosis not present

## 2021-02-15 DIAGNOSIS — E1122 Type 2 diabetes mellitus with diabetic chronic kidney disease: Secondary | ICD-10-CM | POA: Diagnosis not present

## 2021-02-15 DIAGNOSIS — I130 Hypertensive heart and chronic kidney disease with heart failure and stage 1 through stage 4 chronic kidney disease, or unspecified chronic kidney disease: Secondary | ICD-10-CM | POA: Diagnosis not present

## 2021-02-18 DIAGNOSIS — Z006 Encounter for examination for normal comparison and control in clinical research program: Secondary | ICD-10-CM | POA: Diagnosis not present

## 2021-02-18 DIAGNOSIS — T86810 Lung transplant rejection: Secondary | ICD-10-CM | POA: Diagnosis not present

## 2021-02-18 DIAGNOSIS — J42 Unspecified chronic bronchitis: Secondary | ICD-10-CM | POA: Diagnosis not present

## 2021-02-19 DIAGNOSIS — J42 Unspecified chronic bronchitis: Secondary | ICD-10-CM | POA: Diagnosis not present

## 2021-02-19 DIAGNOSIS — T86810 Lung transplant rejection: Secondary | ICD-10-CM | POA: Diagnosis not present

## 2021-02-19 DIAGNOSIS — Z006 Encounter for examination for normal comparison and control in clinical research program: Secondary | ICD-10-CM | POA: Diagnosis not present

## 2021-02-19 DIAGNOSIS — T86818 Other complications of lung transplant: Secondary | ICD-10-CM | POA: Diagnosis not present

## 2021-02-22 DIAGNOSIS — C44329 Squamous cell carcinoma of skin of other parts of face: Secondary | ICD-10-CM | POA: Diagnosis not present

## 2021-02-25 DIAGNOSIS — Z23 Encounter for immunization: Secondary | ICD-10-CM | POA: Diagnosis not present

## 2021-02-25 DIAGNOSIS — D509 Iron deficiency anemia, unspecified: Secondary | ICD-10-CM | POA: Diagnosis not present

## 2021-03-01 ENCOUNTER — Telehealth: Payer: Self-pay

## 2021-03-01 NOTE — Telephone Encounter (Signed)
[facility]  and hospice discharge summary signed by provider and faxed back at [phone]. Copy of signed summary placed in scan.

## 2021-03-04 DIAGNOSIS — D509 Iron deficiency anemia, unspecified: Secondary | ICD-10-CM | POA: Diagnosis not present

## 2021-03-15 DIAGNOSIS — Z45018 Encounter for adjustment and management of other part of cardiac pacemaker: Secondary | ICD-10-CM | POA: Diagnosis not present

## 2021-03-18 DIAGNOSIS — J42 Unspecified chronic bronchitis: Secondary | ICD-10-CM | POA: Diagnosis not present

## 2021-03-18 DIAGNOSIS — T86810 Lung transplant rejection: Secondary | ICD-10-CM | POA: Diagnosis not present

## 2021-03-18 DIAGNOSIS — Z006 Encounter for examination for normal comparison and control in clinical research program: Secondary | ICD-10-CM | POA: Diagnosis not present

## 2021-03-19 DIAGNOSIS — Z45018 Encounter for adjustment and management of other part of cardiac pacemaker: Secondary | ICD-10-CM | POA: Diagnosis not present

## 2021-03-23 ENCOUNTER — Telehealth: Payer: Self-pay

## 2021-03-23 NOTE — Telephone Encounter (Signed)
PT plan of care signed by provider and mailed to [place]  at [facility] [place], [place] [address]

## 2021-03-29 ENCOUNTER — Encounter: Payer: Self-pay | Admitting: Nurse Practitioner

## 2021-03-29 ENCOUNTER — Other Ambulatory Visit: Payer: Self-pay

## 2021-03-29 ENCOUNTER — Ambulatory Visit (INDEPENDENT_AMBULATORY_CARE_PROVIDER_SITE_OTHER): Payer: Medicare Other | Admitting: Nurse Practitioner

## 2021-03-29 VITALS — BP 136/70 | HR 68 | Temp 98.0°F | Resp 16 | Ht 69.0 in | Wt 205.6 lb

## 2021-03-29 DIAGNOSIS — I10 Essential (primary) hypertension: Secondary | ICD-10-CM

## 2021-03-29 DIAGNOSIS — Z0001 Encounter for general adult medical examination with abnormal findings: Secondary | ICD-10-CM | POA: Diagnosis not present

## 2021-03-29 DIAGNOSIS — R300 Dysuria: Secondary | ICD-10-CM | POA: Diagnosis not present

## 2021-03-29 DIAGNOSIS — Z23 Encounter for immunization: Secondary | ICD-10-CM | POA: Diagnosis not present

## 2021-03-29 DIAGNOSIS — E1165 Type 2 diabetes mellitus with hyperglycemia: Secondary | ICD-10-CM

## 2021-03-29 DIAGNOSIS — T86811 Lung transplant failure: Secondary | ICD-10-CM

## 2021-03-29 DIAGNOSIS — E782 Mixed hyperlipidemia: Secondary | ICD-10-CM

## 2021-03-29 NOTE — Progress Notes (Signed)
[facility] [place], [place] [address]  Internal MEDICINE  Office Visit Note  Patient Name: Caleb [name]  [id number]  [id number]  Date of Service: 03/29/2021  Chief Complaint  Patient presents with   Medicare Wellness    HPI Mohsen presents for an annual well visit and physical exam. He is a 75 yo male on portable oxygen via nasal cannula. 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 He states that with supplemental oxygen, he is on 2 LPM at rest and 4 LPM when ambulating. He is overdue to check his lipid panel. He will be due for routine colonoscopy in 2025.  His A1C in 2021 was 10.3, He is being managed by [facility] and his most recent A1C was 6.8 in July.  He denies any pain. He has no other concerns or questions.     Current Medication: Outpatient Encounter Medications as of 03/29/2021  Medication Sig Note   azithromycin (ZITHROMAX) 250 MG tablet Take by mouth.    acetaminophen (TYLENOL) 325 MG tablet Take 650 mg by mouth every 6 (six) hours as needed.    albuterol (VENTOLIN HFA) 108 (90 Base) MCG/ACT inhaler Inhale 2 puffs into the lungs every 6 (six) hours as needed for wheezing or shortness of breath.    azithromycin (ZITHROMAX) 250 MG tablet Take by mouth. Take 1 tab by po 3 times a week    budesonide-formoterol (SYMBICORT) 160-4.5 MCG/ACT inhaler Inhale 2 puffs into the lungs 2 (two) times daily.     calcium acetate, Phos Binder, (PHOSLYRA) 667 MG/5ML SOLN Take 1,334 mg by mouth 3 (three) times daily with meals.    Cholecalciferol (VITAMIN D3) 125 MCG (5000 UT) TBDP Take by mouth.    finasteride (PROSCAR) 5 MG tablet Take 5 mg by mouth daily.    glucagon 1 MG injection For emergency use    insulin regular (NOVOLIN R) 100 units/mL injection 17 units with breakfast, 15 units with lunch, 6 units with dinner PLUS sliding scale    Insulin Syringe-Needle U-100 (BD VEO  INSULIN SYRINGE U/F) 31G X 15/64" 0.3 ML MISC Use 1 Syringe 4 (four) times daily    Magnesium 200 MG TABS Take by mouth. Take 1 tab po daily    metoprolol succinate (TOPROL-XL) 50 MG 24 hr tablet Take 50 mg by mouth daily. Take with or immediately following a meal.    Multiple Vitamin (MULTI-VITAMIN) tablet Take 1 tablet by mouth daily.    mycophenolate (CELLCEPT) 250 MG capsule Take by mouth 2 (two) times daily.    pantoprazole (PROTONIX) 40 MG tablet Take 40 mg by mouth 2 (two) times daily.     polyethylene glycol (MIRALAX / GLYCOLAX) 17 g packet Take 17 g by mouth daily.    predniSONE (DELTASONE) 5 MG tablet Take 5 mg by mouth daily with breakfast.    Rivaroxaban (XARELTO) 15 MG TABS tablet Take 15 mg by mouth daily with supper.    rosuvastatin (CRESTOR) 10 MG tablet Take 10 mg by mouth daily.    tacrolimus (PROGRAF) 0.5 MG capsule Take in combination with 1mg  capsules for total dose of 1mg  by mouth daily and 1.5mg  by mouth nightly.    tacrolimus (PROGRAF) 1 MG capsule Take 1 mg by mouth 2 (two) times daily.  01/14/2020: Takes 1mg  PO BID   tamsulosin (FLOMAX) 0.4 MG CAPS Take 0.4 mg by mouth daily after breakfast. 1 on saturdays  torsemide (DEMADEX) 20 MG tablet Take 60mg  by mouth twice daily x 3 days (9/3-9/5), then decrease to 40mg  by mouth twice daily.  Take additional 40mg  by mouth if weight gain increases by 2 lbs in 1 day or 5 lbs in 1 week.    valGANciclovir (VALCYTE) 450 MG tablet Take 450 mg by mouth every other day. Take every Monday and thursday 01/13/2020: Due today, takes in the evening every other day   No facility-administered encounter medications on file as of 03/29/2021.    Surgical History: Past Surgical History:  Procedure Laterality Date   LUNG TRANSPLANT      Medical History: Past Medical History:  Diagnosis Date   BPH (benign prostatic hyperplasia)    Chest pain, unspecified    Diabetes mellitus without complication ([place])    Dyspnea    Hyperlipidemia     Hypertension     Family History: Family History  Problem Relation Age of Onset   Coronary artery disease Other    Diabetes Other     Social History   Socioeconomic History   Marital status: Married    Spouse name: Not on file   Number of children: Not on file   Years of education: Not on file   Highest education level: Not on file  Occupational History   Not on file  Tobacco Use   Smoking status: Former    Types: Cigarettes    Quit date: 05/16/1979    Years since quitting: 41.9   Smokeless tobacco: Never   Tobacco comments:    Quit 1980  Vaping Use   Vaping Use: Never used  Substance and Sexual Activity   Alcohol use: Not Currently   Drug use: No   Sexual activity: Not on file  Other Topics Concern   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    Social Determinants of Health   Financial Resource Strain: Not on file  Food Insecurity: Not on file  Transportation Needs: Not on file  Physical Activity: Not on file  Stress: Not on file  Social Connections: Not on file  Intimate Partner Violence: Not on file      Review of Systems  Constitutional:  Negative for activity change, appetite change, chills, fatigue, fever and unexpected weight change.  HENT: Negative.  Negative for congestion, ear pain, rhinorrhea, sore throat and trouble swallowing.   Eyes: Negative.   Respiratory:  Positive for cough, shortness of breath and wheezing. Negative for chest tightness.   Cardiovascular: Negative.  Negative for chest pain.  Gastrointestinal: Negative.  Negative for abdominal pain, blood in stool, constipation, diarrhea, nausea and vomiting.  Endocrine: Negative.   Genitourinary: Negative.  Negative for difficulty urinating, dysuria, frequency, hematuria and urgency.  Musculoskeletal: Negative.  Negative for arthralgias, back pain, joint swelling, myalgias and neck pain.  Skin: Negative.  Negative for rash and wound.   Allergic/Immunologic: Negative.  Negative for immunocompromised state.  Neurological: Negative.  Negative for dizziness, seizures, numbness and headaches.  Hematological: Negative.   Psychiatric/Behavioral: Negative.  Negative for behavioral problems, self-injury and suicidal ideas. The patient is not nervous/anxious.    Vital Signs: BP 136/70   Pulse 68   Temp 98 F (36.7 C)   Resp 16   Ht 5\' 9"  (1.753 m)   Wt 205 lb 9.6 oz (93.3 kg)   SpO2 97% Comment: 4 liters when walking and 2 liters when sitting  BMI 30.36 kg/m  Physical Exam Vitals reviewed.  Constitutional:      General: He is awake. He is not in acute distress.    Appearance: Normal appearance. He is well-developed and well-groomed. He is obese. He is not ill-appearing or diaphoretic.     Interventions: Nasal cannula in place.  HENT:     Head: Normocephalic and atraumatic.     Right Ear: Tympanic membrane, ear canal and external ear normal.     Left Ear: Tympanic membrane, ear canal and external ear normal.     Nose: Nose normal. No congestion or rhinorrhea.     Mouth/Throat:     Lips: Pink.     Mouth: Mucous membranes are moist.     Pharynx: Oropharynx is clear. Uvula midline. No oropharyngeal exudate or posterior oropharyngeal erythema.  Eyes:     General: Lids are normal. Vision grossly intact. Gaze aligned appropriately. No scleral icterus.       Right eye: No discharge.        Left eye: No discharge.     Extraocular Movements: Extraocular movements intact.     Conjunctiva/sclera: Conjunctivae normal.     Pupils: Pupils are equal, round, and reactive to light.     Funduscopic exam:    Right eye: Red reflex present.        Left eye: Red reflex present. Neck:     Thyroid: No thyromegaly.     Vascular: No carotid bruit or JVD.     Trachea: Trachea and phonation normal. No tracheal deviation.  Cardiovascular:     Rate and Rhythm: Normal rate and regular rhythm.     Pulses: Normal pulses.     Heart  sounds: Normal heart sounds, S1 normal and S2 normal. No murmur heard.   No friction rub. No gallop.  Pulmonary:     Effort: Accessory muscle usage present. No respiratory distress.     Breath sounds: Normal air entry. No stridor. Examination of the left-upper field reveals rales. Examination of the right-middle field reveals decreased breath sounds. Examination of the left-middle field reveals rales. Examination of the right-lower field reveals decreased breath sounds. Examination of the left-lower field reveals rales. Decreased breath sounds and rales present. No wheezing.  Chest:     Chest wall: No tenderness.  Abdominal:     General: Bowel sounds are normal. There is no distension.     Palpations: Abdomen is soft. There is no shifting dullness, fluid wave, mass or pulsatile mass.     Tenderness: There is no abdominal tenderness. There is no guarding or rebound.  Musculoskeletal:        General: No tenderness or deformity. Normal range of motion.     Cervical back: Normal range of motion and neck supple.  Lymphadenopathy:     Cervical: No cervical adenopathy.  Skin:    General: Skin is warm and dry.     Capillary Refill: Capillary refill takes less than 2 seconds.     Coloration: Skin is not pale.     Findings: No erythema or rash.  Neurological:     Mental Status: He is alert and oriented to person, place, and time.     Cranial Nerves: No cranial nerve deficit.     Motor: No abnormal muscle tone.     Coordination: Coordination normal.     Gait: Gait normal.     Deep Tendon Reflexes: Reflexes are normal and symmetric.  Psychiatric:        Mood and Affect: Mood and affect  normal.        Behavior: Behavior normal. Behavior is cooperative.        Thought Content: Thought content normal.        Judgment: Judgment normal.       Assessment/Plan: 1. Encounter for routine adult health examination with abnormal findings Age-appropriate preventive screenings and vaccinations  discussed, annual physical exam completed. Routine labs for health maintenance ordered, see below. Lipid panel ordered, most other labs have been drawn recently via orders from specialists. PHM updated.   2. Lung transplant failure ([place]) Wears continuous portable oxygen, lung transplant was 6 years ago.   3. Essential hypertension Stable with current medication.   4. Uncontrolled type 2 diabetes mellitus with hyperglycemia ([place]) Managed by [facility] at [facility] clinic, last A1C is 6.8. diabetic foot exam done on 12/07/20 at [facility] endocrinology  5. Mixed hyperlipidemia Overdue to have lipid panel checked. Takes rosuvastatin.  - Lipid Profile  6. Dysuria Routine urinalysis done - UA/M w/rflx Culture, Routine - Microscopic Examination  7. Need for influenza vaccination Administered in office today - Flu Vaccine MDCK QUAD PF      General Counseling: Delbert verbalizes understanding of the findings of todays visit and agrees with plan of treatment. I have discussed any further diagnostic evaluation that may be needed or ordered today. We also reviewed his medications today. he has been encouraged to call the office with any questions or concerns that should arise related to todays visit.    Orders Placed This Encounter  Procedures   Microscopic Examination   Flu Vaccine MDCK QUAD PF   UA/M w/rflx Culture, Routine   Lipid Profile    No orders of the defined types were placed in this encounter.   Return in about 6 months (around 09/26/2021) for F/U, med refill, Zabdi Mis PCP.   Total time spent:30 Minutes Time spent includes review of chart, medications, test results, and follow up plan with the patient.   [place] Controlled Substance Database was reviewed by me.  This patient was seen by Jonetta Osgood, FNP-C in collaboration with Dr. Clayborn Bigness as a part of collaborative care agreement.  Sharika Mosquera R. Valetta Fuller, MSN, FNP-C Internal medicine

## 2021-03-30 LAB — UA/M W/RFLX CULTURE, ROUTINE
Bilirubin, UA: NEGATIVE
Glucose, UA: NEGATIVE
Ketones, UA: NEGATIVE
Leukocytes,UA: NEGATIVE
Nitrite, UA: NEGATIVE
Protein,UA: NEGATIVE
RBC, UA: NEGATIVE
Specific Gravity, UA: 1.01 (ref 1.005–1.030)
Urobilinogen, Ur: 0.2 mg/dL (ref 0.2–1.0)
pH, UA: 5.5 (ref 5.0–7.5)

## 2021-03-30 LAB — MICROSCOPIC EXAMINATION
Bacteria, UA: NONE SEEN
Casts: NONE SEEN /lpf
Epithelial Cells (non renal): NONE SEEN /hpf (ref 0–10)
RBC, Urine: NONE SEEN /hpf (ref 0–2)

## 2021-04-12 DIAGNOSIS — D509 Iron deficiency anemia, unspecified: Secondary | ICD-10-CM | POA: Diagnosis not present

## 2021-04-15 DIAGNOSIS — J42 Unspecified chronic bronchitis: Secondary | ICD-10-CM | POA: Diagnosis not present

## 2021-04-15 DIAGNOSIS — T86810 Lung transplant rejection: Secondary | ICD-10-CM | POA: Diagnosis not present

## 2021-04-15 DIAGNOSIS — Z006 Encounter for examination for normal comparison and control in clinical research program: Secondary | ICD-10-CM | POA: Diagnosis not present

## 2021-04-16 DIAGNOSIS — T86810 Lung transplant rejection: Secondary | ICD-10-CM | POA: Diagnosis not present

## 2021-04-16 DIAGNOSIS — J42 Unspecified chronic bronchitis: Secondary | ICD-10-CM | POA: Diagnosis not present

## 2021-04-16 DIAGNOSIS — Z006 Encounter for examination for normal comparison and control in clinical research program: Secondary | ICD-10-CM | POA: Diagnosis not present

## 2021-04-19 DIAGNOSIS — D509 Iron deficiency anemia, unspecified: Secondary | ICD-10-CM | POA: Diagnosis not present

## 2021-04-25 ENCOUNTER — Encounter: Payer: Self-pay | Admitting: Nurse Practitioner

## 2021-04-27 DIAGNOSIS — D509 Iron deficiency anemia, unspecified: Secondary | ICD-10-CM | POA: Diagnosis not present

## 2021-05-04 DIAGNOSIS — D509 Iron deficiency anemia, unspecified: Secondary | ICD-10-CM | POA: Diagnosis not present

## 2021-05-06 DIAGNOSIS — D225 Melanocytic nevi of trunk: Secondary | ICD-10-CM | POA: Diagnosis not present

## 2021-05-06 DIAGNOSIS — L578 Other skin changes due to chronic exposure to nonionizing radiation: Secondary | ICD-10-CM | POA: Diagnosis not present

## 2021-05-06 DIAGNOSIS — Z85828 Personal history of other malignant neoplasm of skin: Secondary | ICD-10-CM | POA: Diagnosis not present

## 2021-05-06 DIAGNOSIS — D849 Immunodeficiency, unspecified: Secondary | ICD-10-CM | POA: Diagnosis not present

## 2021-05-06 DIAGNOSIS — D2260 Melanocytic nevi of unspecified upper limb, including shoulder: Secondary | ICD-10-CM | POA: Diagnosis not present

## 2021-05-06 DIAGNOSIS — D2270 Melanocytic nevi of unspecified lower limb, including hip: Secondary | ICD-10-CM | POA: Diagnosis not present

## 2021-05-06 DIAGNOSIS — L738 Other specified follicular disorders: Secondary | ICD-10-CM | POA: Diagnosis not present

## 2021-05-06 DIAGNOSIS — L814 Other melanin hyperpigmentation: Secondary | ICD-10-CM | POA: Diagnosis not present

## 2021-05-06 DIAGNOSIS — L570 Actinic keratosis: Secondary | ICD-10-CM | POA: Diagnosis not present

## 2021-05-06 DIAGNOSIS — L821 Other seborrheic keratosis: Secondary | ICD-10-CM | POA: Diagnosis not present

## 2021-05-11 DIAGNOSIS — Z79621 Long term (current) use of calcineurin inhibitor: Secondary | ICD-10-CM | POA: Diagnosis not present

## 2021-05-11 DIAGNOSIS — D849 Immunodeficiency, unspecified: Secondary | ICD-10-CM | POA: Diagnosis not present

## 2021-05-11 DIAGNOSIS — N400 Enlarged prostate without lower urinary tract symptoms: Secondary | ICD-10-CM | POA: Diagnosis not present

## 2021-05-11 DIAGNOSIS — I2510 Atherosclerotic heart disease of native coronary artery without angina pectoris: Secondary | ICD-10-CM | POA: Diagnosis not present

## 2021-05-11 DIAGNOSIS — Z942 Lung transplant status: Secondary | ICD-10-CM | POA: Diagnosis not present

## 2021-05-11 DIAGNOSIS — Z4824 Encounter for aftercare following lung transplant: Secondary | ICD-10-CM | POA: Diagnosis not present

## 2021-05-11 DIAGNOSIS — G4733 Obstructive sleep apnea (adult) (pediatric): Secondary | ICD-10-CM | POA: Diagnosis not present

## 2021-05-11 DIAGNOSIS — Z79899 Other long term (current) drug therapy: Secondary | ICD-10-CM | POA: Diagnosis not present

## 2021-05-11 DIAGNOSIS — J90 Pleural effusion, not elsewhere classified: Secondary | ICD-10-CM | POA: Diagnosis not present

## 2021-05-11 DIAGNOSIS — E1122 Type 2 diabetes mellitus with diabetic chronic kidney disease: Secondary | ICD-10-CM | POA: Diagnosis not present

## 2021-05-11 DIAGNOSIS — N183 Chronic kidney disease, stage 3 unspecified: Secondary | ICD-10-CM | POA: Diagnosis not present

## 2021-05-11 DIAGNOSIS — I509 Heart failure, unspecified: Secondary | ICD-10-CM | POA: Diagnosis not present

## 2021-05-11 DIAGNOSIS — T86818 Other complications of lung transplant: Secondary | ICD-10-CM | POA: Diagnosis not present

## 2021-05-11 DIAGNOSIS — Z5181 Encounter for therapeutic drug level monitoring: Secondary | ICD-10-CM | POA: Diagnosis not present

## 2021-05-11 DIAGNOSIS — D84821 Immunodeficiency due to drugs: Secondary | ICD-10-CM | POA: Diagnosis not present

## 2021-05-11 DIAGNOSIS — Z7952 Long term (current) use of systemic steroids: Secondary | ICD-10-CM | POA: Diagnosis not present

## 2021-05-11 DIAGNOSIS — E785 Hyperlipidemia, unspecified: Secondary | ICD-10-CM | POA: Diagnosis not present

## 2021-05-11 DIAGNOSIS — Z8616 Personal history of COVID-19: Secondary | ICD-10-CM | POA: Diagnosis not present

## 2021-05-13 DIAGNOSIS — J42 Unspecified chronic bronchitis: Secondary | ICD-10-CM | POA: Diagnosis not present

## 2021-05-13 DIAGNOSIS — T86818 Other complications of lung transplant: Secondary | ICD-10-CM | POA: Diagnosis not present

## 2021-05-13 DIAGNOSIS — Z006 Encounter for examination for normal comparison and control in clinical research program: Secondary | ICD-10-CM | POA: Diagnosis not present

## 2021-05-13 DIAGNOSIS — T86810 Lung transplant rejection: Secondary | ICD-10-CM | POA: Diagnosis not present

## 2021-05-14 DIAGNOSIS — Z006 Encounter for examination for normal comparison and control in clinical research program: Secondary | ICD-10-CM | POA: Diagnosis not present

## 2021-05-14 DIAGNOSIS — J42 Unspecified chronic bronchitis: Secondary | ICD-10-CM | POA: Diagnosis not present

## 2021-05-14 DIAGNOSIS — T86810 Lung transplant rejection: Secondary | ICD-10-CM | POA: Diagnosis not present

## 2021-06-09 DIAGNOSIS — Z79899 Other long term (current) drug therapy: Secondary | ICD-10-CM | POA: Diagnosis not present

## 2021-06-09 DIAGNOSIS — Z5181 Encounter for therapeutic drug level monitoring: Secondary | ICD-10-CM | POA: Diagnosis not present

## 2021-06-09 DIAGNOSIS — T86818 Other complications of lung transplant: Secondary | ICD-10-CM | POA: Diagnosis not present

## 2021-06-16 DIAGNOSIS — E785 Hyperlipidemia, unspecified: Secondary | ICD-10-CM | POA: Diagnosis not present

## 2021-06-16 DIAGNOSIS — I152 Hypertension secondary to endocrine disorders: Secondary | ICD-10-CM | POA: Diagnosis not present

## 2021-06-16 DIAGNOSIS — Z8781 Personal history of (healed) traumatic fracture: Secondary | ICD-10-CM | POA: Diagnosis not present

## 2021-06-16 DIAGNOSIS — E1169 Type 2 diabetes mellitus with other specified complication: Secondary | ICD-10-CM | POA: Diagnosis not present

## 2021-06-16 DIAGNOSIS — E1165 Type 2 diabetes mellitus with hyperglycemia: Secondary | ICD-10-CM | POA: Diagnosis not present

## 2021-06-16 DIAGNOSIS — Z794 Long term (current) use of insulin: Secondary | ICD-10-CM | POA: Diagnosis not present

## 2021-06-16 DIAGNOSIS — E1159 Type 2 diabetes mellitus with other circulatory complications: Secondary | ICD-10-CM | POA: Diagnosis not present

## 2021-06-17 DIAGNOSIS — J42 Unspecified chronic bronchitis: Secondary | ICD-10-CM | POA: Diagnosis not present

## 2021-06-17 DIAGNOSIS — Z941 Heart transplant status: Secondary | ICD-10-CM | POA: Diagnosis not present

## 2021-06-17 DIAGNOSIS — I5032 Chronic diastolic (congestive) heart failure: Secondary | ICD-10-CM | POA: Diagnosis not present

## 2021-06-17 DIAGNOSIS — Z006 Encounter for examination for normal comparison and control in clinical research program: Secondary | ICD-10-CM | POA: Diagnosis not present

## 2021-06-17 DIAGNOSIS — T86810 Lung transplant rejection: Secondary | ICD-10-CM | POA: Diagnosis not present

## 2021-06-18 DIAGNOSIS — J42 Unspecified chronic bronchitis: Secondary | ICD-10-CM | POA: Diagnosis not present

## 2021-06-18 DIAGNOSIS — T86810 Lung transplant rejection: Secondary | ICD-10-CM | POA: Diagnosis not present

## 2021-06-18 DIAGNOSIS — Z006 Encounter for examination for normal comparison and control in clinical research program: Secondary | ICD-10-CM | POA: Diagnosis not present

## 2021-06-24 DIAGNOSIS — I10 Essential (primary) hypertension: Secondary | ICD-10-CM | POA: Diagnosis not present

## 2021-07-02 DIAGNOSIS — T86818 Other complications of lung transplant: Secondary | ICD-10-CM | POA: Diagnosis not present

## 2021-07-02 DIAGNOSIS — Z5181 Encounter for therapeutic drug level monitoring: Secondary | ICD-10-CM | POA: Diagnosis not present

## 2021-07-02 DIAGNOSIS — Z942 Lung transplant status: Secondary | ICD-10-CM | POA: Diagnosis not present

## 2021-07-15 DIAGNOSIS — J42 Unspecified chronic bronchitis: Secondary | ICD-10-CM | POA: Diagnosis not present

## 2021-07-15 DIAGNOSIS — Z006 Encounter for examination for normal comparison and control in clinical research program: Secondary | ICD-10-CM | POA: Diagnosis not present

## 2021-07-15 DIAGNOSIS — J849 Interstitial pulmonary disease, unspecified: Secondary | ICD-10-CM | POA: Diagnosis not present

## 2021-07-15 DIAGNOSIS — J9621 Acute and chronic respiratory failure with hypoxia: Secondary | ICD-10-CM | POA: Diagnosis not present

## 2021-07-15 DIAGNOSIS — T86810 Lung transplant rejection: Secondary | ICD-10-CM | POA: Diagnosis not present

## 2021-07-16 DIAGNOSIS — Z006 Encounter for examination for normal comparison and control in clinical research program: Secondary | ICD-10-CM | POA: Diagnosis not present

## 2021-07-16 DIAGNOSIS — J9621 Acute and chronic respiratory failure with hypoxia: Secondary | ICD-10-CM | POA: Diagnosis not present

## 2021-07-16 DIAGNOSIS — J42 Unspecified chronic bronchitis: Secondary | ICD-10-CM | POA: Diagnosis not present

## 2021-07-16 DIAGNOSIS — T86810 Lung transplant rejection: Secondary | ICD-10-CM | POA: Diagnosis not present

## 2021-08-12 DIAGNOSIS — J849 Interstitial pulmonary disease, unspecified: Secondary | ICD-10-CM | POA: Diagnosis not present

## 2021-08-12 DIAGNOSIS — Z79899 Other long term (current) drug therapy: Secondary | ICD-10-CM | POA: Diagnosis not present

## 2021-08-12 DIAGNOSIS — T86810 Lung transplant rejection: Secondary | ICD-10-CM | POA: Diagnosis not present

## 2021-08-12 DIAGNOSIS — Z006 Encounter for examination for normal comparison and control in clinical research program: Secondary | ICD-10-CM | POA: Diagnosis not present

## 2021-08-12 DIAGNOSIS — J9621 Acute and chronic respiratory failure with hypoxia: Secondary | ICD-10-CM | POA: Diagnosis not present

## 2021-08-12 DIAGNOSIS — J410 Simple chronic bronchitis: Secondary | ICD-10-CM | POA: Diagnosis not present

## 2021-08-13 DIAGNOSIS — Z006 Encounter for examination for normal comparison and control in clinical research program: Secondary | ICD-10-CM | POA: Diagnosis not present

## 2021-08-13 DIAGNOSIS — Z942 Lung transplant status: Secondary | ICD-10-CM | POA: Diagnosis not present

## 2021-08-13 DIAGNOSIS — Z87891 Personal history of nicotine dependence: Secondary | ICD-10-CM | POA: Diagnosis not present

## 2021-08-13 DIAGNOSIS — I4891 Unspecified atrial fibrillation: Secondary | ICD-10-CM | POA: Diagnosis present

## 2021-08-13 DIAGNOSIS — T86819 Unspecified complication of lung transplant: Secondary | ICD-10-CM | POA: Diagnosis present

## 2021-08-13 DIAGNOSIS — J9611 Chronic respiratory failure with hypoxia: Secondary | ICD-10-CM | POA: Diagnosis present

## 2021-08-13 DIAGNOSIS — I5023 Acute on chronic systolic (congestive) heart failure: Secondary | ICD-10-CM | POA: Diagnosis present

## 2021-08-13 DIAGNOSIS — E1122 Type 2 diabetes mellitus with diabetic chronic kidney disease: Secondary | ICD-10-CM | POA: Diagnosis present

## 2021-08-13 DIAGNOSIS — T451X5A Adverse effect of antineoplastic and immunosuppressive drugs, initial encounter: Secondary | ICD-10-CM | POA: Diagnosis present

## 2021-08-13 DIAGNOSIS — F32A Depression, unspecified: Secondary | ICD-10-CM | POA: Diagnosis present

## 2021-08-13 DIAGNOSIS — D849 Immunodeficiency, unspecified: Secondary | ICD-10-CM | POA: Diagnosis not present

## 2021-08-13 DIAGNOSIS — J9612 Chronic respiratory failure with hypercapnia: Secondary | ICD-10-CM | POA: Diagnosis present

## 2021-08-13 DIAGNOSIS — R739 Hyperglycemia, unspecified: Secondary | ICD-10-CM | POA: Diagnosis not present

## 2021-08-13 DIAGNOSIS — R0602 Shortness of breath: Secondary | ICD-10-CM | POA: Diagnosis not present

## 2021-08-13 DIAGNOSIS — E785 Hyperlipidemia, unspecified: Secondary | ICD-10-CM | POA: Diagnosis present

## 2021-08-13 DIAGNOSIS — N184 Chronic kidney disease, stage 4 (severe): Secondary | ICD-10-CM | POA: Diagnosis present

## 2021-08-13 DIAGNOSIS — E1169 Type 2 diabetes mellitus with other specified complication: Secondary | ICD-10-CM | POA: Diagnosis not present

## 2021-08-13 DIAGNOSIS — K5909 Other constipation: Secondary | ICD-10-CM | POA: Diagnosis not present

## 2021-08-13 DIAGNOSIS — T86810 Lung transplant rejection: Secondary | ICD-10-CM | POA: Diagnosis not present

## 2021-08-13 DIAGNOSIS — Z794 Long term (current) use of insulin: Secondary | ICD-10-CM | POA: Diagnosis not present

## 2021-08-13 DIAGNOSIS — Z8616 Personal history of COVID-19: Secondary | ICD-10-CM | POA: Diagnosis not present

## 2021-08-13 DIAGNOSIS — I110 Hypertensive heart disease with heart failure: Secondary | ICD-10-CM | POA: Diagnosis present

## 2021-08-13 DIAGNOSIS — D509 Iron deficiency anemia, unspecified: Secondary | ICD-10-CM | POA: Diagnosis present

## 2021-08-13 DIAGNOSIS — D631 Anemia in chronic kidney disease: Secondary | ICD-10-CM | POA: Diagnosis present

## 2021-08-13 DIAGNOSIS — E661 Drug-induced obesity: Secondary | ICD-10-CM | POA: Diagnosis present

## 2021-08-13 DIAGNOSIS — D84821 Immunodeficiency due to drugs: Secondary | ICD-10-CM | POA: Diagnosis present

## 2021-08-13 DIAGNOSIS — T380X5A Adverse effect of glucocorticoids and synthetic analogues, initial encounter: Secondary | ICD-10-CM | POA: Diagnosis not present

## 2021-08-13 DIAGNOSIS — Z20822 Contact with and (suspected) exposure to covid-19: Secondary | ICD-10-CM | POA: Diagnosis present

## 2021-08-13 DIAGNOSIS — R1084 Generalized abdominal pain: Secondary | ICD-10-CM | POA: Diagnosis not present

## 2021-08-13 DIAGNOSIS — I428 Other cardiomyopathies: Secondary | ICD-10-CM | POA: Diagnosis present

## 2021-08-13 DIAGNOSIS — J449 Chronic obstructive pulmonary disease, unspecified: Secondary | ICD-10-CM | POA: Diagnosis present

## 2021-08-13 DIAGNOSIS — E11649 Type 2 diabetes mellitus with hypoglycemia without coma: Secondary | ICD-10-CM | POA: Diagnosis not present

## 2021-08-13 DIAGNOSIS — E1165 Type 2 diabetes mellitus with hyperglycemia: Secondary | ICD-10-CM | POA: Diagnosis present

## 2021-08-13 DIAGNOSIS — J8410 Pulmonary fibrosis, unspecified: Secondary | ICD-10-CM | POA: Diagnosis present

## 2021-08-13 DIAGNOSIS — J42 Unspecified chronic bronchitis: Secondary | ICD-10-CM | POA: Diagnosis not present

## 2021-08-16 DIAGNOSIS — E785 Hyperlipidemia, unspecified: Secondary | ICD-10-CM | POA: Diagnosis present

## 2021-08-16 DIAGNOSIS — T451X5A Adverse effect of antineoplastic and immunosuppressive drugs, initial encounter: Secondary | ICD-10-CM | POA: Diagnosis present

## 2021-08-16 DIAGNOSIS — Z87891 Personal history of nicotine dependence: Secondary | ICD-10-CM | POA: Diagnosis not present

## 2021-08-16 DIAGNOSIS — D849 Immunodeficiency, unspecified: Secondary | ICD-10-CM | POA: Diagnosis not present

## 2021-08-16 DIAGNOSIS — E661 Drug-induced obesity: Secondary | ICD-10-CM | POA: Diagnosis present

## 2021-08-16 DIAGNOSIS — F32A Depression, unspecified: Secondary | ICD-10-CM | POA: Diagnosis present

## 2021-08-16 DIAGNOSIS — E11649 Type 2 diabetes mellitus with hypoglycemia without coma: Secondary | ICD-10-CM | POA: Diagnosis not present

## 2021-08-16 DIAGNOSIS — D84821 Immunodeficiency due to drugs: Secondary | ICD-10-CM | POA: Diagnosis present

## 2021-08-16 DIAGNOSIS — T380X5A Adverse effect of glucocorticoids and synthetic analogues, initial encounter: Secondary | ICD-10-CM | POA: Diagnosis not present

## 2021-08-16 DIAGNOSIS — R739 Hyperglycemia, unspecified: Secondary | ICD-10-CM | POA: Diagnosis not present

## 2021-08-16 DIAGNOSIS — Z794 Long term (current) use of insulin: Secondary | ICD-10-CM | POA: Diagnosis not present

## 2021-08-16 DIAGNOSIS — E1165 Type 2 diabetes mellitus with hyperglycemia: Secondary | ICD-10-CM | POA: Diagnosis present

## 2021-08-16 DIAGNOSIS — R0602 Shortness of breath: Secondary | ICD-10-CM | POA: Diagnosis not present

## 2021-08-16 DIAGNOSIS — Z8616 Personal history of COVID-19: Secondary | ICD-10-CM | POA: Diagnosis not present

## 2021-08-16 DIAGNOSIS — I4891 Unspecified atrial fibrillation: Secondary | ICD-10-CM | POA: Diagnosis present

## 2021-08-16 DIAGNOSIS — J9612 Chronic respiratory failure with hypercapnia: Secondary | ICD-10-CM | POA: Diagnosis present

## 2021-08-16 DIAGNOSIS — K5909 Other constipation: Secondary | ICD-10-CM | POA: Diagnosis not present

## 2021-08-16 DIAGNOSIS — E1169 Type 2 diabetes mellitus with other specified complication: Secondary | ICD-10-CM | POA: Diagnosis not present

## 2021-08-16 DIAGNOSIS — E1122 Type 2 diabetes mellitus with diabetic chronic kidney disease: Secondary | ICD-10-CM | POA: Diagnosis present

## 2021-08-16 DIAGNOSIS — Z942 Lung transplant status: Secondary | ICD-10-CM | POA: Diagnosis not present

## 2021-08-16 DIAGNOSIS — I428 Other cardiomyopathies: Secondary | ICD-10-CM | POA: Diagnosis present

## 2021-08-16 DIAGNOSIS — I5023 Acute on chronic systolic (congestive) heart failure: Secondary | ICD-10-CM | POA: Diagnosis present

## 2021-08-16 DIAGNOSIS — R1084 Generalized abdominal pain: Secondary | ICD-10-CM | POA: Diagnosis not present

## 2021-08-16 DIAGNOSIS — D631 Anemia in chronic kidney disease: Secondary | ICD-10-CM | POA: Diagnosis present

## 2021-08-16 DIAGNOSIS — J9611 Chronic respiratory failure with hypoxia: Secondary | ICD-10-CM | POA: Diagnosis present

## 2021-08-16 DIAGNOSIS — I110 Hypertensive heart disease with heart failure: Secondary | ICD-10-CM | POA: Diagnosis present

## 2021-08-16 DIAGNOSIS — J449 Chronic obstructive pulmonary disease, unspecified: Secondary | ICD-10-CM | POA: Diagnosis present

## 2021-08-16 DIAGNOSIS — D509 Iron deficiency anemia, unspecified: Secondary | ICD-10-CM | POA: Diagnosis present

## 2021-08-16 DIAGNOSIS — T86819 Unspecified complication of lung transplant: Secondary | ICD-10-CM | POA: Diagnosis present

## 2021-08-16 DIAGNOSIS — N184 Chronic kidney disease, stage 4 (severe): Secondary | ICD-10-CM | POA: Diagnosis present

## 2021-08-16 DIAGNOSIS — J8410 Pulmonary fibrosis, unspecified: Secondary | ICD-10-CM | POA: Diagnosis present

## 2021-08-16 DIAGNOSIS — Z20822 Contact with and (suspected) exposure to covid-19: Secondary | ICD-10-CM | POA: Diagnosis present

## 2021-08-16 DIAGNOSIS — Z006 Encounter for examination for normal comparison and control in clinical research program: Secondary | ICD-10-CM | POA: Diagnosis not present

## 2021-08-17 DIAGNOSIS — K5909 Other constipation: Secondary | ICD-10-CM | POA: Insufficient documentation

## 2021-09-09 DIAGNOSIS — Z006 Encounter for examination for normal comparison and control in clinical research program: Secondary | ICD-10-CM | POA: Diagnosis not present

## 2021-09-09 DIAGNOSIS — T86810 Lung transplant rejection: Secondary | ICD-10-CM | POA: Diagnosis not present

## 2021-09-09 DIAGNOSIS — J9621 Acute and chronic respiratory failure with hypoxia: Secondary | ICD-10-CM | POA: Diagnosis not present

## 2021-09-09 DIAGNOSIS — Z79899 Other long term (current) drug therapy: Secondary | ICD-10-CM | POA: Diagnosis not present

## 2021-09-09 DIAGNOSIS — Z5181 Encounter for therapeutic drug level monitoring: Secondary | ICD-10-CM | POA: Diagnosis not present

## 2021-09-09 DIAGNOSIS — J849 Interstitial pulmonary disease, unspecified: Secondary | ICD-10-CM | POA: Diagnosis not present

## 2021-09-09 DIAGNOSIS — J42 Unspecified chronic bronchitis: Secondary | ICD-10-CM | POA: Diagnosis not present

## 2021-09-10 DIAGNOSIS — Z006 Encounter for examination for normal comparison and control in clinical research program: Secondary | ICD-10-CM | POA: Diagnosis not present

## 2021-09-10 DIAGNOSIS — T86810 Lung transplant rejection: Secondary | ICD-10-CM | POA: Diagnosis not present

## 2021-09-10 DIAGNOSIS — J42 Unspecified chronic bronchitis: Secondary | ICD-10-CM | POA: Diagnosis not present

## 2021-09-13 DIAGNOSIS — E1159 Type 2 diabetes mellitus with other circulatory complications: Secondary | ICD-10-CM | POA: Diagnosis not present

## 2021-09-13 DIAGNOSIS — E1165 Type 2 diabetes mellitus with hyperglycemia: Secondary | ICD-10-CM | POA: Diagnosis not present

## 2021-09-13 DIAGNOSIS — E1169 Type 2 diabetes mellitus with other specified complication: Secondary | ICD-10-CM | POA: Diagnosis not present

## 2021-09-13 DIAGNOSIS — I152 Hypertension secondary to endocrine disorders: Secondary | ICD-10-CM | POA: Diagnosis not present

## 2021-09-13 DIAGNOSIS — E785 Hyperlipidemia, unspecified: Secondary | ICD-10-CM | POA: Diagnosis not present

## 2021-09-13 DIAGNOSIS — Z794 Long term (current) use of insulin: Secondary | ICD-10-CM | POA: Diagnosis not present

## 2021-09-16 DIAGNOSIS — I5022 Chronic systolic (congestive) heart failure: Secondary | ICD-10-CM | POA: Diagnosis not present

## 2021-09-16 DIAGNOSIS — I071 Rheumatic tricuspid insufficiency: Secondary | ICD-10-CM | POA: Diagnosis not present

## 2021-09-16 DIAGNOSIS — Z942 Lung transplant status: Secondary | ICD-10-CM | POA: Diagnosis not present

## 2021-09-16 DIAGNOSIS — I5032 Chronic diastolic (congestive) heart failure: Secondary | ICD-10-CM | POA: Diagnosis not present

## 2021-09-27 ENCOUNTER — Encounter: Payer: Self-pay | Admitting: Nurse Practitioner

## 2021-09-27 ENCOUNTER — Ambulatory Visit (INDEPENDENT_AMBULATORY_CARE_PROVIDER_SITE_OTHER): Payer: Medicare Other | Admitting: Nurse Practitioner

## 2021-09-27 VITALS — BP 118/60 | HR 73 | Temp 98.3°F | Resp 16 | Ht 69.0 in | Wt 212.4 lb

## 2021-09-27 DIAGNOSIS — I10 Essential (primary) hypertension: Secondary | ICD-10-CM

## 2021-09-27 DIAGNOSIS — E782 Mixed hyperlipidemia: Secondary | ICD-10-CM | POA: Diagnosis not present

## 2021-09-27 DIAGNOSIS — T86811 Lung transplant failure: Secondary | ICD-10-CM

## 2021-09-27 DIAGNOSIS — E1165 Type 2 diabetes mellitus with hyperglycemia: Secondary | ICD-10-CM

## 2021-09-27 NOTE — Progress Notes (Signed)
[facility] [place], [place] [address]  Internal MEDICINE  Office Visit Note  Patient Name: Caleb [name]  [id number]  [id number]  Date of Service: 09/27/2021  Chief Complaint  Patient presents with   Follow-up    Discuss hemroids    Diabetes    Sees endo   Hyperlipidemia   Hypertension   Medication Refill    HPI Caleb [name] presents for follow-up visit for hypertension, hyperlipidemia and medication refills.  He has his cholesterol levels checked in January and they were normal, he continues to take rosuvastatin for this.  He sees endocrinology for his diabetes management.  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  He has had no significant change in his respiratory status since his previous office visit.  He continues to use 2 L/min of supplemental oxygen at rest and 4 L/min when he is up and walking.  He has a oxygen concentrator at home. He has had some difficulty with dual therapy inhalers that they seem to be too expensive, discussed triple therapy inhalers with the patient today and he stated he will discuss this with his pulmonologist at [place] at his next office visit. Another concern he has for today are her hemorrhoids, he is already taking stool softener/laxative and is not helping, discussed topical treatments with the patient today    Current Medication: Outpatient Encounter Medications as of 09/27/2021  Medication Sig Note   acetaminophen (TYLENOL) 325 MG tablet Take 650 mg by mouth every 6 (six) hours as needed.    albuterol (VENTOLIN HFA) 108 (90 Base) MCG/ACT inhaler Inhale 2 puffs into the lungs every 6 (six) hours as needed for wheezing or shortness of breath.    azithromycin (ZITHROMAX) 250 MG tablet Take by mouth. Take 1 tab by po 3 times a week    azithromycin (ZITHROMAX) 250 MG tablet Take by mouth.    calcium acetate, Phos Binder, (PHOSLYRA) 667 MG/5ML SOLN Take 1,334 mg by mouth 3 (three)  times daily with meals.    Cholecalciferol (VITAMIN D3) 125 MCG (5000 UT) TBDP Take by mouth.    finasteride (PROSCAR) 5 MG tablet Take 5 mg by mouth daily.    glucagon 1 MG injection For emergency use    insulin lispro (HUMALOG) 100 UNIT/ML KwikPen Inject into the skin.    insulin regular (NOVOLIN R) 100 units/mL injection 17 units with breakfast, 15 units with lunch, 6 units with dinner PLUS sliding scale    Insulin Syringe-Needle U-100 (BD VEO INSULIN SYRINGE U/F) 31G X 15/64" 0.3 ML MISC Use 1 Syringe 4 (four) times daily    Magnesium 200 MG TABS Take by mouth. Take 1 tab po daily    metoprolol succinate (TOPROL-XL) 50 MG 24 hr tablet Take 50 mg by mouth daily. Take with or immediately following a meal.    mometasone-formoterol (DULERA) 200-5 MCG/ACT AERO Inhale into the lungs.    Multiple Vitamin (MULTI-VITAMIN) tablet Take 1 tablet by mouth daily.    mycophenolate (CELLCEPT) 250 MG capsule Take by mouth 2 (two) times daily.    pantoprazole (PROTONIX) 40 MG tablet Take 40 mg by mouth 2 (two) times daily.     polyethylene glycol (MIRALAX / GLYCOLAX) 17 g packet Take 17 g by mouth daily.    predniSONE (DELTASONE) 5 MG tablet Take 5 mg by mouth daily with breakfast.    Rivaroxaban (XARELTO) 15 MG TABS tablet Take 15 mg by mouth daily with  supper.    rosuvastatin (CRESTOR) 10 MG tablet Take 10 mg by mouth daily.    tacrolimus (PROGRAF) 0.5 MG capsule Take in combination with '1mg'$  capsules for total dose of '1mg'$  by mouth daily and 1.'5mg'$  by mouth nightly.    tacrolimus (PROGRAF) 1 MG capsule Take 1 mg by mouth 2 (two) times daily.  01/14/2020: Takes '1mg'$  PO BID   tamsulosin (FLOMAX) 0.4 MG CAPS Take 0.4 mg by mouth daily after breakfast. 1 on saturdays    torsemide (DEMADEX) 20 MG tablet Take '60mg'$  by mouth twice daily x 3 days (9/3-9/5), then decrease to '40mg'$  by mouth twice daily.  Take additional '40mg'$  by mouth if weight gain increases by 2 lbs in 1 day or 5 lbs in 1 week.    valGANciclovir (VALCYTE)  450 MG tablet Take 450 mg by mouth every other day. Take every Monday and thursday 01/13/2020: Due today, takes in the evening every other day   budesonide-formoterol (SYMBICORT) 160-4.5 MCG/ACT inhaler Inhale 2 puffs into the lungs 2 (two) times daily.     FLUoxetine (PROZAC) 10 MG capsule Take 10 mg by mouth daily.    No facility-administered encounter medications on file as of 09/27/2021.    Surgical History: Past Surgical History:  Procedure Laterality Date   LUNG TRANSPLANT      Medical History: Past Medical History:  Diagnosis Date   BPH (benign prostatic hyperplasia)    Chest pain, unspecified    Diabetes mellitus without complication ([place])    Dyspnea    Hyperlipidemia    Hypertension     Family History: Family History  Problem Relation Age of Onset   Coronary artery disease Other    Diabetes Other     Social History   Socioeconomic History   Marital status: Married    Spouse name: Not on file   Number of children: Not on file   Years of education: Not on file   Highest education level: Not on file  Occupational History   Not on file  Tobacco Use   Smoking status: Former    Types: Cigarettes    Quit date: 05/16/1979    Years since quitting: 42.5   Smokeless tobacco: Never   Tobacco comments:    Quit 1980  Vaping Use   Vaping Use: Never used  Substance and Sexual Activity   Alcohol use: Not Currently   Drug use: No   Sexual activity: Not on file  Other Topics Concern   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    Social Determinants of Health   Financial Resource Strain: Not on file  Food Insecurity: Not on file  Transportation Needs: Not on file  Physical Activity: Not on file  Stress: Not on file  Social Connections: Not on file  Intimate Partner Violence: Not on file      Review of Systems  Constitutional:  Negative for chills, fatigue and unexpected weight change.  HENT:  Negative for  congestion, rhinorrhea, sneezing and sore throat.   Respiratory:  Positive for cough, shortness of breath and wheezing. Negative for chest tightness.   Cardiovascular: Negative.  Negative for chest pain and palpitations.  Gastrointestinal:  Positive for constipation (Related to pain from hemorrhoids) and rectal pain (Related to hemorrhoids). Negative for abdominal pain, diarrhea, nausea and vomiting.  Musculoskeletal:  Negative for arthralgias, back pain, joint swelling and neck pain.  Skin:  Negative for rash.  Neurological: Negative.  Negative for tremors and numbness.  Hematological:  Negative for adenopathy. Does not bruise/bleed easily.  Psychiatric/Behavioral:  Negative for behavioral problems (Depression), sleep disturbance and suicidal ideas. The patient is not nervous/anxious.     Vital Signs: BP 118/60   Pulse 73   Temp 98.3 F (36.8 C)   Resp 16   Ht '5\' 9"'$  (1.753 m)   Wt 212 lb 6.4 oz (96.3 kg)   SpO2 99% Comment: with 2L oxygen  BMI 31.37 kg/m    Physical Exam Vitals reviewed.  Constitutional:      General: He is not in acute distress.    Appearance: Normal appearance. He is obese. He is not ill-appearing.  HENT:     Head: Normocephalic and atraumatic.  Eyes:     Pupils: Pupils are equal, round, and reactive to light.  Cardiovascular:     Rate and Rhythm: Normal rate and regular rhythm.  Pulmonary:     Effort: Tachypnea (High normal) and accessory muscle usage (Intermittent not continuous) present. No respiratory distress.     Breath sounds: Normal air entry. Examination of the left-upper field reveals wheezing and rales. Examination of the left-middle field reveals wheezing and rales. Examination of the left-lower field reveals wheezing and rales. Wheezing and rales present.  Neurological:     Mental Status: He is alert and oriented to person, place, and time.  Psychiatric:        Mood and Affect: Mood normal.        Behavior: Behavior normal.         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 Essential hypertension Stable with current medications.  3. Uncontrolled type 2 diabetes mellitus with hyperglycemia ([place]) Managed by endocrinology  4. Mixed hyperlipidemia Continue rosuvastatin as prescribed, levels are within normal limits   General Counseling: luiscarlos kaczmarczyk understanding of the findings of todays visit and agrees with plan of treatment. I have discussed any further diagnostic evaluation that may be needed or ordered today. We also reviewed his medications today. he has been encouraged to call the office with any questions or concerns that should arise related to todays visit.    No orders of the defined types were placed in this encounter.   No orders of the defined types were placed in this encounter.   Return for previously scheduled, CPE, Darrell Leonhardt PCP in october. .   Total time spent:30 Minutes Time spent includes review of chart, medications, test results, and follow up plan with the patient.   [place] Controlled Substance Database was reviewed by me.  This patient was seen by Jonetta Osgood, FNP-C in collaboration with Dr. Clayborn Bigness as a part of collaborative care agreement.   Xavia Kniskern R. Valetta Fuller, MSN, FNP-C Internal medicine

## 2021-10-07 DIAGNOSIS — T86810 Lung transplant rejection: Secondary | ICD-10-CM | POA: Diagnosis not present

## 2021-10-07 DIAGNOSIS — J42 Unspecified chronic bronchitis: Secondary | ICD-10-CM | POA: Diagnosis not present

## 2021-10-07 DIAGNOSIS — Z006 Encounter for examination for normal comparison and control in clinical research program: Secondary | ICD-10-CM | POA: Diagnosis not present

## 2021-10-08 DIAGNOSIS — J849 Interstitial pulmonary disease, unspecified: Secondary | ICD-10-CM | POA: Diagnosis not present

## 2021-10-08 DIAGNOSIS — T86810 Lung transplant rejection: Secondary | ICD-10-CM | POA: Diagnosis not present

## 2021-10-08 DIAGNOSIS — J410 Simple chronic bronchitis: Secondary | ICD-10-CM | POA: Diagnosis not present

## 2021-10-08 DIAGNOSIS — J9621 Acute and chronic respiratory failure with hypoxia: Secondary | ICD-10-CM | POA: Diagnosis not present

## 2021-10-08 DIAGNOSIS — Z006 Encounter for examination for normal comparison and control in clinical research program: Secondary | ICD-10-CM | POA: Diagnosis not present

## 2021-10-12 DIAGNOSIS — G4733 Obstructive sleep apnea (adult) (pediatric): Secondary | ICD-10-CM | POA: Diagnosis not present

## 2021-10-12 DIAGNOSIS — Z942 Lung transplant status: Secondary | ICD-10-CM | POA: Diagnosis not present

## 2021-10-12 DIAGNOSIS — Z7969 Long term (current) use of other immunomodulators and immunosuppressants: Secondary | ICD-10-CM | POA: Diagnosis not present

## 2021-10-12 DIAGNOSIS — E785 Hyperlipidemia, unspecified: Secondary | ICD-10-CM | POA: Diagnosis not present

## 2021-10-12 DIAGNOSIS — I480 Paroxysmal atrial fibrillation: Secondary | ICD-10-CM | POA: Diagnosis not present

## 2021-10-12 DIAGNOSIS — J42 Unspecified chronic bronchitis: Secondary | ICD-10-CM | POA: Diagnosis not present

## 2021-10-12 DIAGNOSIS — N184 Chronic kidney disease, stage 4 (severe): Secondary | ICD-10-CM | POA: Diagnosis not present

## 2021-10-12 DIAGNOSIS — D84821 Immunodeficiency due to drugs: Secondary | ICD-10-CM | POA: Diagnosis not present

## 2021-10-12 DIAGNOSIS — I2510 Atherosclerotic heart disease of native coronary artery without angina pectoris: Secondary | ICD-10-CM | POA: Diagnosis not present

## 2021-10-12 DIAGNOSIS — D509 Iron deficiency anemia, unspecified: Secondary | ICD-10-CM | POA: Diagnosis not present

## 2021-10-12 DIAGNOSIS — K649 Unspecified hemorrhoids: Secondary | ICD-10-CM | POA: Diagnosis not present

## 2021-10-12 DIAGNOSIS — K922 Gastrointestinal hemorrhage, unspecified: Secondary | ICD-10-CM | POA: Diagnosis not present

## 2021-10-12 DIAGNOSIS — Z7952 Long term (current) use of systemic steroids: Secondary | ICD-10-CM | POA: Diagnosis not present

## 2021-10-12 DIAGNOSIS — J8410 Pulmonary fibrosis, unspecified: Secondary | ICD-10-CM | POA: Diagnosis not present

## 2021-10-12 DIAGNOSIS — I509 Heart failure, unspecified: Secondary | ICD-10-CM | POA: Diagnosis not present

## 2021-10-12 DIAGNOSIS — Z8709 Personal history of other diseases of the respiratory system: Secondary | ICD-10-CM | POA: Diagnosis not present

## 2021-10-12 DIAGNOSIS — Z79899 Other long term (current) drug therapy: Secondary | ICD-10-CM | POA: Diagnosis not present

## 2021-10-12 DIAGNOSIS — I4891 Unspecified atrial fibrillation: Secondary | ICD-10-CM | POA: Diagnosis not present

## 2021-10-12 DIAGNOSIS — Z8616 Personal history of COVID-19: Secondary | ICD-10-CM | POA: Diagnosis not present

## 2021-10-12 DIAGNOSIS — N400 Enlarged prostate without lower urinary tract symptoms: Secondary | ICD-10-CM | POA: Diagnosis not present

## 2021-10-12 DIAGNOSIS — Z5181 Encounter for therapeutic drug level monitoring: Secondary | ICD-10-CM | POA: Diagnosis not present

## 2021-10-12 DIAGNOSIS — Z7901 Long term (current) use of anticoagulants: Secondary | ICD-10-CM | POA: Diagnosis not present

## 2021-10-12 DIAGNOSIS — T86818 Other complications of lung transplant: Secondary | ICD-10-CM | POA: Diagnosis not present

## 2021-10-12 DIAGNOSIS — T86810 Lung transplant rejection: Secondary | ICD-10-CM | POA: Diagnosis not present

## 2021-11-04 DIAGNOSIS — Z794 Long term (current) use of insulin: Secondary | ICD-10-CM | POA: Diagnosis not present

## 2021-11-04 DIAGNOSIS — N178 Other acute kidney failure: Secondary | ICD-10-CM | POA: Diagnosis not present

## 2021-11-04 DIAGNOSIS — D84821 Immunodeficiency due to drugs: Secondary | ICD-10-CM | POA: Diagnosis not present

## 2021-11-04 DIAGNOSIS — K219 Gastro-esophageal reflux disease without esophagitis: Secondary | ICD-10-CM | POA: Diagnosis not present

## 2021-11-04 DIAGNOSIS — Z942 Lung transplant status: Secondary | ICD-10-CM | POA: Diagnosis not present

## 2021-11-04 DIAGNOSIS — Z006 Encounter for examination for normal comparison and control in clinical research program: Secondary | ICD-10-CM | POA: Diagnosis not present

## 2021-11-04 DIAGNOSIS — E785 Hyperlipidemia, unspecified: Secondary | ICD-10-CM | POA: Diagnosis not present

## 2021-11-04 DIAGNOSIS — N184 Chronic kidney disease, stage 4 (severe): Secondary | ICD-10-CM | POA: Diagnosis not present

## 2021-11-04 DIAGNOSIS — D509 Iron deficiency anemia, unspecified: Secondary | ICD-10-CM | POA: Diagnosis not present

## 2021-11-04 DIAGNOSIS — I130 Hypertensive heart and chronic kidney disease with heart failure and stage 1 through stage 4 chronic kidney disease, or unspecified chronic kidney disease: Secondary | ICD-10-CM | POA: Diagnosis not present

## 2021-11-04 DIAGNOSIS — E1165 Type 2 diabetes mellitus with hyperglycemia: Secondary | ICD-10-CM | POA: Diagnosis not present

## 2021-11-04 DIAGNOSIS — Z79621 Long term (current) use of calcineurin inhibitor: Secondary | ICD-10-CM | POA: Diagnosis not present

## 2021-11-04 DIAGNOSIS — Z791 Long term (current) use of non-steroidal anti-inflammatories (NSAID): Secondary | ICD-10-CM | POA: Diagnosis not present

## 2021-11-04 DIAGNOSIS — Z886 Allergy status to analgesic agent status: Secondary | ICD-10-CM | POA: Diagnosis not present

## 2021-11-04 DIAGNOSIS — I5020 Unspecified systolic (congestive) heart failure: Secondary | ICD-10-CM | POA: Diagnosis not present

## 2021-11-04 DIAGNOSIS — Z7901 Long term (current) use of anticoagulants: Secondary | ICD-10-CM | POA: Diagnosis not present

## 2021-11-04 DIAGNOSIS — Z7951 Long term (current) use of inhaled steroids: Secondary | ICD-10-CM | POA: Diagnosis not present

## 2021-11-04 DIAGNOSIS — K648 Other hemorrhoids: Secondary | ICD-10-CM | POA: Diagnosis not present

## 2021-11-04 DIAGNOSIS — Z7952 Long term (current) use of systemic steroids: Secondary | ICD-10-CM | POA: Diagnosis not present

## 2021-11-04 DIAGNOSIS — I428 Other cardiomyopathies: Secondary | ICD-10-CM | POA: Diagnosis not present

## 2021-11-04 DIAGNOSIS — N400 Enlarged prostate without lower urinary tract symptoms: Secondary | ICD-10-CM | POA: Diagnosis not present

## 2021-11-04 DIAGNOSIS — D500 Iron deficiency anemia secondary to blood loss (chronic): Secondary | ICD-10-CM | POA: Diagnosis not present

## 2021-11-04 DIAGNOSIS — Z8616 Personal history of COVID-19: Secondary | ICD-10-CM | POA: Diagnosis not present

## 2021-11-04 DIAGNOSIS — E1122 Type 2 diabetes mellitus with diabetic chronic kidney disease: Secondary | ICD-10-CM | POA: Diagnosis not present

## 2021-11-04 DIAGNOSIS — G4733 Obstructive sleep apnea (adult) (pediatric): Secondary | ICD-10-CM | POA: Diagnosis not present

## 2021-11-05 DIAGNOSIS — I5020 Unspecified systolic (congestive) heart failure: Secondary | ICD-10-CM | POA: Diagnosis not present

## 2021-11-05 DIAGNOSIS — Z006 Encounter for examination for normal comparison and control in clinical research program: Secondary | ICD-10-CM | POA: Diagnosis not present

## 2021-11-05 DIAGNOSIS — I130 Hypertensive heart and chronic kidney disease with heart failure and stage 1 through stage 4 chronic kidney disease, or unspecified chronic kidney disease: Secondary | ICD-10-CM | POA: Diagnosis not present

## 2021-11-05 DIAGNOSIS — K219 Gastro-esophageal reflux disease without esophagitis: Secondary | ICD-10-CM | POA: Diagnosis not present

## 2021-11-05 DIAGNOSIS — G4733 Obstructive sleep apnea (adult) (pediatric): Secondary | ICD-10-CM | POA: Diagnosis not present

## 2021-11-05 DIAGNOSIS — D509 Iron deficiency anemia, unspecified: Secondary | ICD-10-CM | POA: Diagnosis not present

## 2021-11-05 DIAGNOSIS — N178 Other acute kidney failure: Secondary | ICD-10-CM | POA: Diagnosis not present

## 2021-11-05 DIAGNOSIS — R079 Chest pain, unspecified: Secondary | ICD-10-CM | POA: Diagnosis not present

## 2021-11-05 DIAGNOSIS — Z9989 Dependence on other enabling machines and devices: Secondary | ICD-10-CM | POA: Diagnosis not present

## 2021-11-05 DIAGNOSIS — E1122 Type 2 diabetes mellitus with diabetic chronic kidney disease: Secondary | ICD-10-CM | POA: Diagnosis not present

## 2021-11-05 DIAGNOSIS — Z942 Lung transplant status: Secondary | ICD-10-CM | POA: Diagnosis not present

## 2021-11-07 ENCOUNTER — Encounter: Payer: Self-pay | Admitting: Nurse Practitioner

## 2021-11-11 DIAGNOSIS — Z85828 Personal history of other malignant neoplasm of skin: Secondary | ICD-10-CM | POA: Diagnosis not present

## 2021-11-11 DIAGNOSIS — D849 Immunodeficiency, unspecified: Secondary | ICD-10-CM | POA: Diagnosis not present

## 2021-11-11 DIAGNOSIS — L570 Actinic keratosis: Secondary | ICD-10-CM | POA: Diagnosis not present

## 2021-11-11 DIAGNOSIS — D225 Melanocytic nevi of trunk: Secondary | ICD-10-CM | POA: Diagnosis not present

## 2021-11-11 DIAGNOSIS — L821 Other seborrheic keratosis: Secondary | ICD-10-CM | POA: Diagnosis not present

## 2021-11-11 DIAGNOSIS — L738 Other specified follicular disorders: Secondary | ICD-10-CM | POA: Diagnosis not present

## 2021-11-11 DIAGNOSIS — D485 Neoplasm of uncertain behavior of skin: Secondary | ICD-10-CM | POA: Diagnosis not present

## 2021-11-11 DIAGNOSIS — L814 Other melanin hyperpigmentation: Secondary | ICD-10-CM | POA: Diagnosis not present

## 2021-11-11 DIAGNOSIS — B353 Tinea pedis: Secondary | ICD-10-CM | POA: Diagnosis not present

## 2021-11-11 DIAGNOSIS — L304 Erythema intertrigo: Secondary | ICD-10-CM | POA: Diagnosis not present

## 2021-11-11 DIAGNOSIS — L578 Other skin changes due to chronic exposure to nonionizing radiation: Secondary | ICD-10-CM | POA: Diagnosis not present

## 2021-11-11 DIAGNOSIS — D2260 Melanocytic nevi of unspecified upper limb, including shoulder: Secondary | ICD-10-CM | POA: Diagnosis not present

## 2021-11-18 DIAGNOSIS — D509 Iron deficiency anemia, unspecified: Secondary | ICD-10-CM | POA: Diagnosis not present

## 2021-11-23 DIAGNOSIS — Z79899 Other long term (current) drug therapy: Secondary | ICD-10-CM | POA: Diagnosis not present

## 2021-11-23 DIAGNOSIS — D84821 Immunodeficiency due to drugs: Secondary | ICD-10-CM | POA: Diagnosis not present

## 2021-11-23 DIAGNOSIS — E1122 Type 2 diabetes mellitus with diabetic chronic kidney disease: Secondary | ICD-10-CM | POA: Diagnosis not present

## 2021-11-23 DIAGNOSIS — J8410 Pulmonary fibrosis, unspecified: Secondary | ICD-10-CM | POA: Diagnosis not present

## 2021-11-23 DIAGNOSIS — Z7901 Long term (current) use of anticoagulants: Secondary | ICD-10-CM | POA: Diagnosis not present

## 2021-11-23 DIAGNOSIS — Z95810 Presence of automatic (implantable) cardiac defibrillator: Secondary | ICD-10-CM | POA: Diagnosis not present

## 2021-11-23 DIAGNOSIS — Z8709 Personal history of other diseases of the respiratory system: Secondary | ICD-10-CM | POA: Diagnosis not present

## 2021-11-23 DIAGNOSIS — D849 Immunodeficiency, unspecified: Secondary | ICD-10-CM | POA: Diagnosis not present

## 2021-11-23 DIAGNOSIS — Z4824 Encounter for aftercare following lung transplant: Secondary | ICD-10-CM | POA: Diagnosis not present

## 2021-11-23 DIAGNOSIS — N184 Chronic kidney disease, stage 4 (severe): Secondary | ICD-10-CM | POA: Diagnosis not present

## 2021-11-23 DIAGNOSIS — T86818 Other complications of lung transplant: Secondary | ICD-10-CM | POA: Diagnosis not present

## 2021-11-23 DIAGNOSIS — Z7952 Long term (current) use of systemic steroids: Secondary | ICD-10-CM | POA: Diagnosis not present

## 2021-11-23 DIAGNOSIS — N400 Enlarged prostate without lower urinary tract symptoms: Secondary | ICD-10-CM | POA: Diagnosis not present

## 2021-11-23 DIAGNOSIS — I700 Atherosclerosis of aorta: Secondary | ICD-10-CM | POA: Diagnosis not present

## 2021-11-23 DIAGNOSIS — Z79621 Long term (current) use of calcineurin inhibitor: Secondary | ICD-10-CM | POA: Diagnosis not present

## 2021-11-23 DIAGNOSIS — G4733 Obstructive sleep apnea (adult) (pediatric): Secondary | ICD-10-CM | POA: Diagnosis not present

## 2021-11-23 DIAGNOSIS — E785 Hyperlipidemia, unspecified: Secondary | ICD-10-CM | POA: Diagnosis not present

## 2021-11-23 DIAGNOSIS — Z942 Lung transplant status: Secondary | ICD-10-CM | POA: Diagnosis not present

## 2021-11-23 DIAGNOSIS — I4891 Unspecified atrial fibrillation: Secondary | ICD-10-CM | POA: Diagnosis not present

## 2021-11-23 DIAGNOSIS — Z5181 Encounter for therapeutic drug level monitoring: Secondary | ICD-10-CM | POA: Diagnosis not present

## 2021-11-23 DIAGNOSIS — Z8616 Personal history of COVID-19: Secondary | ICD-10-CM | POA: Diagnosis not present

## 2021-11-23 DIAGNOSIS — D509 Iron deficiency anemia, unspecified: Secondary | ICD-10-CM | POA: Diagnosis not present

## 2021-11-23 DIAGNOSIS — R918 Other nonspecific abnormal finding of lung field: Secondary | ICD-10-CM | POA: Diagnosis not present

## 2021-11-23 DIAGNOSIS — I2510 Atherosclerotic heart disease of native coronary artery without angina pectoris: Secondary | ICD-10-CM | POA: Diagnosis not present

## 2021-11-25 DIAGNOSIS — D509 Iron deficiency anemia, unspecified: Secondary | ICD-10-CM | POA: Diagnosis not present

## 2021-12-02 DIAGNOSIS — D509 Iron deficiency anemia, unspecified: Secondary | ICD-10-CM | POA: Diagnosis not present

## 2021-12-09 DIAGNOSIS — T86810 Lung transplant rejection: Secondary | ICD-10-CM | POA: Diagnosis not present

## 2021-12-09 DIAGNOSIS — Z006 Encounter for examination for normal comparison and control in clinical research program: Secondary | ICD-10-CM | POA: Diagnosis not present

## 2021-12-09 DIAGNOSIS — D509 Iron deficiency anemia, unspecified: Secondary | ICD-10-CM | POA: Diagnosis not present

## 2021-12-09 DIAGNOSIS — J42 Unspecified chronic bronchitis: Secondary | ICD-10-CM | POA: Diagnosis not present

## 2021-12-10 DIAGNOSIS — Z45018 Encounter for adjustment and management of other part of cardiac pacemaker: Secondary | ICD-10-CM | POA: Diagnosis not present

## 2021-12-10 DIAGNOSIS — Z006 Encounter for examination for normal comparison and control in clinical research program: Secondary | ICD-10-CM | POA: Diagnosis not present

## 2021-12-10 DIAGNOSIS — T86810 Lung transplant rejection: Secondary | ICD-10-CM | POA: Diagnosis not present

## 2021-12-10 DIAGNOSIS — J42 Unspecified chronic bronchitis: Secondary | ICD-10-CM | POA: Diagnosis not present

## 2021-12-12 DIAGNOSIS — Z45018 Encounter for adjustment and management of other part of cardiac pacemaker: Secondary | ICD-10-CM | POA: Diagnosis not present

## 2021-12-16 DIAGNOSIS — D509 Iron deficiency anemia, unspecified: Secondary | ICD-10-CM | POA: Diagnosis not present

## 2021-12-23 DIAGNOSIS — D509 Iron deficiency anemia, unspecified: Secondary | ICD-10-CM | POA: Diagnosis not present

## 2021-12-28 DIAGNOSIS — Z8616 Personal history of COVID-19: Secondary | ICD-10-CM | POA: Diagnosis not present

## 2021-12-28 DIAGNOSIS — G4733 Obstructive sleep apnea (adult) (pediatric): Secondary | ICD-10-CM | POA: Diagnosis not present

## 2021-12-28 DIAGNOSIS — E1122 Type 2 diabetes mellitus with diabetic chronic kidney disease: Secondary | ICD-10-CM | POA: Diagnosis not present

## 2021-12-28 DIAGNOSIS — Z79624 Long term (current) use of inhibitors of nucleotide synthesis: Secondary | ICD-10-CM | POA: Diagnosis not present

## 2021-12-28 DIAGNOSIS — Z942 Lung transplant status: Secondary | ICD-10-CM | POA: Diagnosis not present

## 2021-12-28 DIAGNOSIS — Z5181 Encounter for therapeutic drug level monitoring: Secondary | ICD-10-CM | POA: Diagnosis not present

## 2021-12-28 DIAGNOSIS — I2510 Atherosclerotic heart disease of native coronary artery without angina pectoris: Secondary | ICD-10-CM | POA: Diagnosis not present

## 2021-12-28 DIAGNOSIS — Z79899 Other long term (current) drug therapy: Secondary | ICD-10-CM | POA: Diagnosis not present

## 2021-12-28 DIAGNOSIS — D849 Immunodeficiency, unspecified: Secondary | ICD-10-CM | POA: Diagnosis not present

## 2021-12-28 DIAGNOSIS — D509 Iron deficiency anemia, unspecified: Secondary | ICD-10-CM | POA: Diagnosis not present

## 2021-12-28 DIAGNOSIS — D631 Anemia in chronic kidney disease: Secondary | ICD-10-CM | POA: Diagnosis not present

## 2021-12-28 DIAGNOSIS — Z4824 Encounter for aftercare following lung transplant: Secondary | ICD-10-CM | POA: Diagnosis not present

## 2021-12-28 DIAGNOSIS — T86818 Other complications of lung transplant: Secondary | ICD-10-CM | POA: Diagnosis not present

## 2021-12-28 DIAGNOSIS — E785 Hyperlipidemia, unspecified: Secondary | ICD-10-CM | POA: Diagnosis not present

## 2021-12-28 DIAGNOSIS — Z7952 Long term (current) use of systemic steroids: Secondary | ICD-10-CM | POA: Diagnosis not present

## 2021-12-30 DIAGNOSIS — Z4502 Encounter for adjustment and management of automatic implantable cardiac defibrillator: Secondary | ICD-10-CM | POA: Diagnosis not present

## 2021-12-30 DIAGNOSIS — J849 Interstitial pulmonary disease, unspecified: Secondary | ICD-10-CM | POA: Diagnosis not present

## 2021-12-30 DIAGNOSIS — J42 Unspecified chronic bronchitis: Secondary | ICD-10-CM | POA: Diagnosis not present

## 2021-12-30 DIAGNOSIS — Z006 Encounter for examination for normal comparison and control in clinical research program: Secondary | ICD-10-CM | POA: Diagnosis not present

## 2021-12-30 DIAGNOSIS — T86810 Lung transplant rejection: Secondary | ICD-10-CM | POA: Diagnosis not present

## 2021-12-31 DIAGNOSIS — J9621 Acute and chronic respiratory failure with hypoxia: Secondary | ICD-10-CM | POA: Diagnosis not present

## 2021-12-31 DIAGNOSIS — T86810 Lung transplant rejection: Secondary | ICD-10-CM | POA: Diagnosis not present

## 2021-12-31 DIAGNOSIS — J42 Unspecified chronic bronchitis: Secondary | ICD-10-CM | POA: Diagnosis not present

## 2021-12-31 DIAGNOSIS — J849 Interstitial pulmonary disease, unspecified: Secondary | ICD-10-CM | POA: Diagnosis not present

## 2021-12-31 DIAGNOSIS — Z006 Encounter for examination for normal comparison and control in clinical research program: Secondary | ICD-10-CM | POA: Diagnosis not present

## 2022-01-11 DIAGNOSIS — E1169 Type 2 diabetes mellitus with other specified complication: Secondary | ICD-10-CM | POA: Diagnosis not present

## 2022-01-11 DIAGNOSIS — E1159 Type 2 diabetes mellitus with other circulatory complications: Secondary | ICD-10-CM | POA: Diagnosis not present

## 2022-01-11 DIAGNOSIS — T86818 Other complications of lung transplant: Secondary | ICD-10-CM | POA: Diagnosis not present

## 2022-01-11 DIAGNOSIS — I152 Hypertension secondary to endocrine disorders: Secondary | ICD-10-CM | POA: Diagnosis not present

## 2022-01-11 DIAGNOSIS — Z794 Long term (current) use of insulin: Secondary | ICD-10-CM | POA: Diagnosis not present

## 2022-01-11 DIAGNOSIS — Z5181 Encounter for therapeutic drug level monitoring: Secondary | ICD-10-CM | POA: Diagnosis not present

## 2022-01-11 DIAGNOSIS — Z942 Lung transplant status: Secondary | ICD-10-CM | POA: Diagnosis not present

## 2022-01-11 DIAGNOSIS — Z79899 Other long term (current) drug therapy: Secondary | ICD-10-CM | POA: Diagnosis not present

## 2022-01-11 DIAGNOSIS — Z8781 Personal history of (healed) traumatic fracture: Secondary | ICD-10-CM | POA: Diagnosis not present

## 2022-01-11 DIAGNOSIS — E1165 Type 2 diabetes mellitus with hyperglycemia: Secondary | ICD-10-CM | POA: Diagnosis not present

## 2022-01-11 DIAGNOSIS — M8468XA Pathological fracture in other disease, other site, initial encounter for fracture: Secondary | ICD-10-CM | POA: Diagnosis not present

## 2022-01-11 DIAGNOSIS — M8588 Other specified disorders of bone density and structure, other site: Secondary | ICD-10-CM | POA: Diagnosis not present

## 2022-01-11 DIAGNOSIS — E785 Hyperlipidemia, unspecified: Secondary | ICD-10-CM | POA: Diagnosis not present

## 2022-01-27 DIAGNOSIS — Z79899 Other long term (current) drug therapy: Secondary | ICD-10-CM | POA: Diagnosis not present

## 2022-01-27 DIAGNOSIS — T86810 Lung transplant rejection: Secondary | ICD-10-CM | POA: Diagnosis not present

## 2022-01-27 DIAGNOSIS — J42 Unspecified chronic bronchitis: Secondary | ICD-10-CM | POA: Diagnosis not present

## 2022-01-27 DIAGNOSIS — Z006 Encounter for examination for normal comparison and control in clinical research program: Secondary | ICD-10-CM | POA: Diagnosis not present

## 2022-01-28 DIAGNOSIS — J42 Unspecified chronic bronchitis: Secondary | ICD-10-CM | POA: Diagnosis not present

## 2022-01-28 DIAGNOSIS — Z79899 Other long term (current) drug therapy: Secondary | ICD-10-CM | POA: Diagnosis not present

## 2022-01-28 DIAGNOSIS — T86810 Lung transplant rejection: Secondary | ICD-10-CM | POA: Diagnosis not present

## 2022-01-28 DIAGNOSIS — Z006 Encounter for examination for normal comparison and control in clinical research program: Secondary | ICD-10-CM | POA: Diagnosis not present

## 2022-02-03 DIAGNOSIS — E119 Type 2 diabetes mellitus without complications: Secondary | ICD-10-CM | POA: Diagnosis not present

## 2022-02-18 DIAGNOSIS — T86818 Other complications of lung transplant: Secondary | ICD-10-CM | POA: Diagnosis not present

## 2022-02-18 DIAGNOSIS — N184 Chronic kidney disease, stage 4 (severe): Secondary | ICD-10-CM | POA: Diagnosis not present

## 2022-02-18 DIAGNOSIS — I480 Paroxysmal atrial fibrillation: Secondary | ICD-10-CM | POA: Diagnosis not present

## 2022-02-18 DIAGNOSIS — Z942 Lung transplant status: Secondary | ICD-10-CM | POA: Diagnosis not present

## 2022-02-18 DIAGNOSIS — R918 Other nonspecific abnormal finding of lung field: Secondary | ICD-10-CM | POA: Diagnosis not present

## 2022-02-18 DIAGNOSIS — Z5181 Encounter for therapeutic drug level monitoring: Secondary | ICD-10-CM | POA: Diagnosis not present

## 2022-02-18 DIAGNOSIS — Z79899 Other long term (current) drug therapy: Secondary | ICD-10-CM | POA: Diagnosis not present

## 2022-02-18 DIAGNOSIS — D849 Immunodeficiency, unspecified: Secondary | ICD-10-CM | POA: Diagnosis not present

## 2022-02-18 DIAGNOSIS — J42 Unspecified chronic bronchitis: Secondary | ICD-10-CM | POA: Diagnosis not present

## 2022-02-18 DIAGNOSIS — T86819 Unspecified complication of lung transplant: Secondary | ICD-10-CM | POA: Diagnosis not present

## 2022-02-18 DIAGNOSIS — Z23 Encounter for immunization: Secondary | ICD-10-CM | POA: Diagnosis not present

## 2022-02-24 DIAGNOSIS — Z006 Encounter for examination for normal comparison and control in clinical research program: Secondary | ICD-10-CM | POA: Diagnosis not present

## 2022-02-24 DIAGNOSIS — J410 Simple chronic bronchitis: Secondary | ICD-10-CM | POA: Diagnosis not present

## 2022-02-24 DIAGNOSIS — J9621 Acute and chronic respiratory failure with hypoxia: Secondary | ICD-10-CM | POA: Diagnosis not present

## 2022-02-24 DIAGNOSIS — J42 Unspecified chronic bronchitis: Secondary | ICD-10-CM | POA: Diagnosis not present

## 2022-02-24 DIAGNOSIS — T86810 Lung transplant rejection: Secondary | ICD-10-CM | POA: Diagnosis not present

## 2022-02-24 DIAGNOSIS — J849 Interstitial pulmonary disease, unspecified: Secondary | ICD-10-CM | POA: Diagnosis not present

## 2022-02-25 DIAGNOSIS — J9621 Acute and chronic respiratory failure with hypoxia: Secondary | ICD-10-CM | POA: Diagnosis not present

## 2022-02-25 DIAGNOSIS — Z006 Encounter for examination for normal comparison and control in clinical research program: Secondary | ICD-10-CM | POA: Diagnosis not present

## 2022-02-25 DIAGNOSIS — J849 Interstitial pulmonary disease, unspecified: Secondary | ICD-10-CM | POA: Diagnosis not present

## 2022-02-25 DIAGNOSIS — T86810 Lung transplant rejection: Secondary | ICD-10-CM | POA: Diagnosis not present

## 2022-02-25 DIAGNOSIS — J42 Unspecified chronic bronchitis: Secondary | ICD-10-CM | POA: Diagnosis not present

## 2022-02-25 DIAGNOSIS — J410 Simple chronic bronchitis: Secondary | ICD-10-CM | POA: Diagnosis not present

## 2022-03-02 DIAGNOSIS — C44329 Squamous cell carcinoma of skin of other parts of face: Secondary | ICD-10-CM | POA: Diagnosis not present

## 2022-03-08 DIAGNOSIS — N184 Chronic kidney disease, stage 4 (severe): Secondary | ICD-10-CM | POA: Insufficient documentation

## 2022-03-11 DIAGNOSIS — Z45018 Encounter for adjustment and management of other part of cardiac pacemaker: Secondary | ICD-10-CM | POA: Diagnosis not present

## 2022-03-16 DIAGNOSIS — E871 Hypo-osmolality and hyponatremia: Secondary | ICD-10-CM | POA: Diagnosis not present

## 2022-03-16 DIAGNOSIS — N184 Chronic kidney disease, stage 4 (severe): Secondary | ICD-10-CM | POA: Diagnosis not present

## 2022-03-16 DIAGNOSIS — N400 Enlarged prostate without lower urinary tract symptoms: Secondary | ICD-10-CM | POA: Diagnosis not present

## 2022-03-16 DIAGNOSIS — E875 Hyperkalemia: Secondary | ICD-10-CM | POA: Diagnosis not present

## 2022-03-16 DIAGNOSIS — I129 Hypertensive chronic kidney disease with stage 1 through stage 4 chronic kidney disease, or unspecified chronic kidney disease: Secondary | ICD-10-CM | POA: Diagnosis not present

## 2022-03-16 DIAGNOSIS — D500 Iron deficiency anemia secondary to blood loss (chronic): Secondary | ICD-10-CM | POA: Diagnosis not present

## 2022-03-16 DIAGNOSIS — I10 Essential (primary) hypertension: Secondary | ICD-10-CM | POA: Diagnosis not present

## 2022-03-17 DIAGNOSIS — Z942 Lung transplant status: Secondary | ICD-10-CM | POA: Diagnosis not present

## 2022-03-17 DIAGNOSIS — I5032 Chronic diastolic (congestive) heart failure: Secondary | ICD-10-CM | POA: Diagnosis not present

## 2022-03-23 DIAGNOSIS — H2513 Age-related nuclear cataract, bilateral: Secondary | ICD-10-CM | POA: Diagnosis not present

## 2022-03-23 DIAGNOSIS — E113292 Type 2 diabetes mellitus with mild nonproliferative diabetic retinopathy without macular edema, left eye: Secondary | ICD-10-CM | POA: Diagnosis not present

## 2022-03-24 DIAGNOSIS — J42 Unspecified chronic bronchitis: Secondary | ICD-10-CM | POA: Diagnosis not present

## 2022-03-24 DIAGNOSIS — J9621 Acute and chronic respiratory failure with hypoxia: Secondary | ICD-10-CM | POA: Diagnosis not present

## 2022-03-24 DIAGNOSIS — J4481 Bronchiolitis obliterans and bronchiolitis obliterans syndrome: Secondary | ICD-10-CM | POA: Diagnosis not present

## 2022-03-24 DIAGNOSIS — Z006 Encounter for examination for normal comparison and control in clinical research program: Secondary | ICD-10-CM | POA: Diagnosis not present

## 2022-03-24 DIAGNOSIS — T86810 Lung transplant rejection: Secondary | ICD-10-CM | POA: Diagnosis not present

## 2022-03-24 DIAGNOSIS — J849 Interstitial pulmonary disease, unspecified: Secondary | ICD-10-CM | POA: Diagnosis not present

## 2022-03-25 DIAGNOSIS — Z006 Encounter for examination for normal comparison and control in clinical research program: Secondary | ICD-10-CM | POA: Diagnosis not present

## 2022-03-25 DIAGNOSIS — J42 Unspecified chronic bronchitis: Secondary | ICD-10-CM | POA: Diagnosis not present

## 2022-03-25 DIAGNOSIS — J4481 Bronchiolitis obliterans and bronchiolitis obliterans syndrome: Secondary | ICD-10-CM | POA: Diagnosis not present

## 2022-03-25 DIAGNOSIS — T86810 Lung transplant rejection: Secondary | ICD-10-CM | POA: Diagnosis not present

## 2022-03-25 DIAGNOSIS — J9621 Acute and chronic respiratory failure with hypoxia: Secondary | ICD-10-CM | POA: Diagnosis not present

## 2022-03-25 DIAGNOSIS — J849 Interstitial pulmonary disease, unspecified: Secondary | ICD-10-CM | POA: Diagnosis not present

## 2022-03-27 NOTE — Progress Notes (Signed)
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  He is on Flomax and finasteride chronically and has been for many years.  He is not followed by urologist.  Baseline creatinine appears to be around 3.3, was high as 2.8 in June 2023.  Creatinine has been abnormally elevated since at least 2017 when it was 1.7 and normal prior to lung transplant.  In nephrology clinic, patient was noted to have 290 cc PVR.    PVR 325 cc  Today, he reports that his urinary symptoms are primarily obstructive in nature.  He has decree stream, sensation of incomplete bladder emptying which is very bothersome to him.  This resulted in some frequency.  IPSS as below.  He does have an appointment at [facility] urology upcoming.  He lives in [place] and was hoping that he could have care locally.  No dysuria or gross hematuria.  No recent prostate cancer screening.  His brother does have prostate cancer and is a patient of ours.    IPSS     Row Name 03/29/22 0800         International Prostate Symptom Score   How often have you had the sensation of not emptying your bladder? Almost always     How often have you had to urinate less than every two hours? Less than half the time     How often have you found you stopped and started again several times when you urinated? Less than 1 in 5 times     How often have you found it difficult to postpone urination? Almost always     How often have you had a weak urinary stream? About half the time     How often have you had to strain to start urination? Less than half the time     Total IPSS Score 18        Quality of Life due to urinary symptoms   If you were to spend the rest of your life with your urinary condition just the way it is now how would you feel about that? Unhappy              Score:  1-7 Mild 8-19 Moderate 20-35 Severe    PMH: Past Medical History:  Diagnosis Date   BPH (benign prostatic hyperplasia)    Chest pain, unspecified    Diabetes mellitus without complication (HCC)    Dyspnea    Hyperlipidemia    Hypertension     Surgical History: Past Surgical History:  Procedure Laterality Date   LUNG TRANSPLANT      Home Medications:  Allergies as of 03/29/2022       Reactions   Nsaids Other (See Comments)   S/p lung transplant on prograf which in combination with NSAIDs can cause renal failure.        Medication List        Accurate as of March 29, 2022 11:26 AM. If you have any questions, ask your nurse or doctor.          acetaminophen 325 MG tablet Commonly known as: TYLENOL Take 650 mg by mouth every 6 (six)  hours as needed.   albuterol 108 (90 Base) MCG/ACT inhaler Commonly known as: VENTOLIN HFA Inhale 2 puffs into the lungs every 6 (six) hours as needed for wheezing or shortness of breath.   azithromycin 250 MG tablet Commonly known as: ZITHROMAX Take by mouth. Take 1 tab by po 3 times a week   BD Veo Insulin Syringe U/F 31G X 15/64" 0.3 ML Misc Generic drug: Insulin Syringe-Needle U-100 Use 1 Syringe 4 (four) times daily   budesonide-formoterol 160-4.5 MCG/ACT inhaler Commonly known as: SYMBICORT Inhale 2 puffs into the lungs 2 (two) times daily.   calcium acetate (Phos Binder) 667 MG/5ML Soln Commonly known as: PHOSLYRA Take 1,334 mg by mouth 3 (three) times daily with meals.   finasteride 5 MG tablet Commonly known as: PROSCAR Take 5 mg by mouth daily.   FLUoxetine 10 MG capsule Commonly known as: PROZAC Take 10 mg by mouth daily.   glucagon 1 MG injection For emergency use   insulin lispro 100 UNIT/ML  KwikPen Commonly known as: HUMALOG Inject into the skin.   insulin regular 100 units/mL injection Commonly known as: NOVOLIN R 17 units with breakfast, 15 units with lunch, 6 units with dinner PLUS sliding scale   Magnesium 200 MG Tabs Take by mouth. Take 1 tab po daily   metoprolol succinate 50 MG 24 hr tablet Commonly known as: TOPROL-XL Take 50 mg by mouth daily. Take with or immediately following a meal.   mometasone-formoterol 200-5 MCG/ACT Aero Commonly known as: DULERA Inhale into the lungs.   Multi-Vitamin tablet Take 1 tablet by mouth daily.   mycophenolate 250 MG capsule Commonly known as: CELLCEPT Take by mouth 2 (two) times daily.   pantoprazole 40 MG tablet Commonly known as: PROTONIX Take 40 mg by mouth 2 (two) times daily.   polyethylene glycol 17 g packet Commonly known as: MIRALAX / GLYCOLAX Take 17 g by mouth daily.   predniSONE 5 MG tablet Commonly known as: DELTASONE Take 5 mg by mouth daily with breakfast.   Rivaroxaban 15 MG Tabs tablet Commonly known as: XARELTO Take 15 mg by mouth daily with supper.   rosuvastatin 10 MG tablet Commonly known as: CRESTOR Take 10 mg by mouth daily.   tacrolimus 1 MG capsule Commonly known as: PROGRAF Take 1 mg by mouth 2 (two) times daily.   tacrolimus 0.5 MG capsule Commonly known as: PROGRAF Take in combination with 1mg  capsules for total dose of 1mg  by mouth daily and 1.5mg  by mouth nightly.   tamsulosin 0.4 MG Caps capsule Commonly known as: FLOMAX Take 0.4 mg by mouth daily after breakfast. 1 on saturdays   torsemide 20 MG tablet Commonly known as: DEMADEX Take 60mg  by mouth twice daily x 3 days (9/3-9/5), then decrease to 40mg  by mouth twice daily.  Take additional 40mg  by mouth if weight gain increases by 2 lbs in 1 day or 5 lbs in 1 week.   valGANciclovir 450 MG tablet Commonly known as: VALCYTE Take 450 mg by mouth every other day. Take every Monday and thursday   Vitamin D3 125 MCG  (5000 UT) Tbdp Take by mouth.        Allergies:  Allergies  Allergen Reactions   Nsaids Other (See Comments)    S/p lung transplant on prograf which in combination with NSAIDs can cause renal failure.    Family History: Family History  Problem Relation Age of Onset   Coronary artery disease Other    Diabetes Other  Social History:  reports that he quit smoking about 42 years ago. His smoking use included cigarettes. He has never used smokeless tobacco. He reports that he does not currently use alcohol. He reports that he does not use drugs.   Physical Exam: BP 122/69   Pulse 84   Ht 5\' 9"  (1.753 m)   Wt 209 lb (94.8 kg)   BMI 30.86 kg/m   Constitutional:  Alert and oriented, No acute distress.  Accompanied by his wife today.  Wearing O2. HEENT: [place] AT, moist mucus membranes.  Trachea midline, no masses. Cardiovascular: No clubbing, cyanosis, or edema. Respiratory: Normal respiratory effort, wearing O2 Rectal: External hemorrhoids noted.  Normal sphincter tone.  Enlarged 50+ cc prostate, nontender no nodules. Skin: No rashes, bruises or suspicious lesions. Neurologic: Grossly intact, no focal deficits, moving all 4 extremities. Psychiatric: Normal mood and affect.  Laboratory Data: Lab Results  Component Value Date   WBC 14.4 (H) 01/17/2020   HGB 9.7 (L) 01/17/2020   HCT 30.7 (L) 01/17/2020   MCV 82.7 01/17/2020   PLT 163 01/17/2020    Lab Results  Component Value Date   CREATININE 5.06 (H) 01/18/2020    Lab Results  Component Value Date   PSA 1.70 12/13/2012   Lab Results  Component Value Date   HGBA1C 10.3 (H) 01/14/2020   Pertinent Imaging: No recent upper tract imaging, PVR as above   Assessment & Plan:    1. Benign prostatic hyperplasia with incomplete bladder emptying Given his family history of prostate cancer significant urinary symptoms, its reasonable to rule out advanced prostate cancer, although patient is where we would not recommend  if his PSA is mildly elevated like his other medical conditions  Rectal exam today was benign, shows rubbery nodularity and enlargement   Unfortunately, he is optimized on maximal medical therapy with Flomax and finasteride  We discussed neck steps in treatment would be consideration of outlet procedure either in the form of minimally invasive UroLift versus TURP versus HoLEP, etc.  In the setting of his medical comorbidities, its unlikely that he would be a surgical candidate at a community based hospital center and he understands and is aware of this.  He does have an upcoming appointment at [facility] urology.  I have recommended updating prostate cancer screening today in light of his urinary symptoms as outlined above, renal ultrasound to evaluate his upper tracts, and consideration of self cath teaching.  If he is not deemed a surgical candidate by [facility], he will pursue that here.  Both he and his wife are understanding of the situation and agreeable with the above plan. - Bladder Scan (Post Void Residual) in office - PSA - US RENAL; Future   2. Stage 4 chronic kidney disease (HCC) As above - US RENAL; Future  F/u prn  Vanna Scotland, MD  [facility] [address], [address] [place], [place] [address] [phone]  I spent 47 total minutes on the day of the encounter including pre-visit review of the medical record, face-to-face time with the patient, and post visit ordering of labs/imaging/tests.

## 2022-03-28 ENCOUNTER — Telehealth: Payer: Self-pay | Admitting: Nurse Practitioner

## 2022-03-28 NOTE — Telephone Encounter (Signed)
Left vm and sent mychart message to confirm 04/04/22 appointment-Toni

## 2022-03-29 ENCOUNTER — Ambulatory Visit (INDEPENDENT_AMBULATORY_CARE_PROVIDER_SITE_OTHER): Payer: Medicare Other | Admitting: Urology

## 2022-03-29 ENCOUNTER — Encounter: Payer: Self-pay | Admitting: Urology

## 2022-03-29 VITALS — BP 122/69 | HR 84 | Ht 69.0 in | Wt 209.0 lb

## 2022-03-29 DIAGNOSIS — N401 Enlarged prostate with lower urinary tract symptoms: Secondary | ICD-10-CM | POA: Diagnosis not present

## 2022-03-29 DIAGNOSIS — N184 Chronic kidney disease, stage 4 (severe): Secondary | ICD-10-CM | POA: Diagnosis not present

## 2022-03-29 DIAGNOSIS — R339 Retention of urine, unspecified: Secondary | ICD-10-CM | POA: Diagnosis not present

## 2022-03-29 LAB — BLADDER SCAN AMB NON-IMAGING: Scan Result: 325

## 2022-03-30 ENCOUNTER — Telehealth: Payer: Self-pay

## 2022-03-30 DIAGNOSIS — H2512 Age-related nuclear cataract, left eye: Secondary | ICD-10-CM | POA: Diagnosis not present

## 2022-03-30 LAB — PSA: Prostate Specific Ag, Serum: 1 ng/mL (ref 0.0–4.0)

## 2022-03-30 NOTE — Telephone Encounter (Signed)
Informed patient of results

## 2022-03-30 NOTE — Telephone Encounter (Signed)
-----   Message from Hollice Espy, MD sent at 03/30/2022  8:50 AM EDT ----- Doristine Devoid news, your PSA is completely normal, 1.0  Hollice Espy, MD

## 2022-04-04 ENCOUNTER — Encounter: Payer: Self-pay | Admitting: Ophthalmology

## 2022-04-04 ENCOUNTER — Ambulatory Visit (INDEPENDENT_AMBULATORY_CARE_PROVIDER_SITE_OTHER): Payer: Medicare Other | Admitting: Nurse Practitioner

## 2022-04-04 VITALS — BP 133/73 | HR 84 | Temp 97.0°F | Resp 16 | Ht 69.0 in | Wt 212.2 lb

## 2022-04-04 DIAGNOSIS — Z0001 Encounter for general adult medical examination with abnormal findings: Secondary | ICD-10-CM | POA: Diagnosis not present

## 2022-04-04 DIAGNOSIS — I10 Essential (primary) hypertension: Secondary | ICD-10-CM | POA: Diagnosis not present

## 2022-04-04 DIAGNOSIS — T86811 Lung transplant failure: Secondary | ICD-10-CM

## 2022-04-04 NOTE — Progress Notes (Signed)
[facility] [place], [place] [address]  Internal MEDICINE  Office Visit Note  Patient Name: Caleb [name]  [id number]  [id number]  Date of Service: 04/04/2022  Chief Complaint  Patient presents with   Medicare Wellness   Hypertension   Hyperlipidemia   Diabetes    HPI Caleb [name] presents for his annual medicare wellness visit and physical exam.  He is a 76 year old male on continuous supplemental oxygen but otherwise well-appearing.  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 --upcoming cataract surgery of left eye next week, and then the right eye at the end of the month.  --routine labs are up to date right now. --reports Having to use rescue inhaler more often. Gets SOB quicker and more difficult to recover.  --diabetes is managed by [facility] endocrinology -- Dr. Honor Junes  --No preventive screenings due.  --No refills needed.  --on supplemental oxygen at 3 LPM when out of the house and 2.5 LPM when at home.  --has to use 4 LPM when showering.    Current Medication: Outpatient Encounter Medications as of 04/04/2022  Medication Sig Note   acetaminophen (TYLENOL) 325 MG tablet Take 650 mg by mouth every 6 (six) hours as needed.    albuterol (VENTOLIN HFA) 108 (90 Base) MCG/ACT inhaler Inhale 2 puffs into the lungs every 6 (six) hours as needed for wheezing or shortness of breath.    azithromycin (ZITHROMAX) 250 MG tablet Take by mouth. Take 1 tab by po 3 times a week    calcium acetate, Phos Binder, (PHOSLYRA) 667 MG/5ML SOLN Take 1,334 mg by mouth 3 (three) times daily with meals.    Cholecalciferol (VITAMIN D3) 125 MCG (5000 UT) TBDP Take by mouth.    finasteride (PROSCAR) 5 MG tablet Take 5 mg by mouth daily.    FLUoxetine (PROZAC) 10 MG capsule Take 10 mg by mouth daily.    glucagon 1 MG injection For emergency use    insulin lispro (HUMALOG) 100 UNIT/ML KwikPen Inject into the skin.    insulin regular  (NOVOLIN R) 100 units/mL injection 17 units with breakfast, 15 units with lunch, 6 units with dinner PLUS sliding scale    Insulin Syringe-Needle U-100 (BD VEO INSULIN SYRINGE U/F) 31G X 15/64" 0.3 ML MISC Use 1 Syringe 4 (four) times daily    Magnesium 200 MG TABS Take by mouth. Take 1 tab po daily    metoprolol succinate (TOPROL-XL) 50 MG 24 hr tablet Take 50 mg by mouth daily. Take with or immediately following a meal.    mometasone-formoterol (DULERA) 200-5 MCG/ACT AERO Inhale into the lungs.    Multiple Vitamin (MULTI-VITAMIN) tablet Take 1 tablet by mouth daily.    mycophenolate (CELLCEPT) 250 MG capsule Take by mouth 2 (two) times daily.    pantoprazole (PROTONIX) 40 MG tablet Take 40 mg by mouth 2 (two) times daily.     polyethylene glycol (MIRALAX / GLYCOLAX) 17 g packet Take 17 g by mouth daily.    predniSONE (DELTASONE) 5 MG tablet Take 5 mg by mouth daily with breakfast.    Rivaroxaban (XARELTO) 15 MG TABS tablet Take 15 mg by mouth daily with supper.    rosuvastatin (CRESTOR) 10 MG tablet Take 10 mg by mouth daily.    tacrolimus (PROGRAF) 0.5 MG capsule Take in combination with '1mg'$  capsules for total dose of '1mg'$  by mouth daily and 1.'5mg'$  by mouth nightly.    tacrolimus (PROGRAF) 1 MG capsule Take  1 mg by mouth 2 (two) times daily.  01/14/2020: Takes '1mg'$  PO BID   tamsulosin (FLOMAX) 0.4 MG CAPS Take 0.4 mg by mouth daily after breakfast. 1 on saturdays    torsemide (DEMADEX) 20 MG tablet Take '60mg'$  by mouth twice daily x 3 days (9/3-9/5), then decrease to '40mg'$  by mouth twice daily.  Take additional '40mg'$  by mouth if weight gain increases by 2 lbs in 1 day or 5 lbs in 1 week.    valGANciclovir (VALCYTE) 450 MG tablet Take 450 mg by mouth every other day. Take every Monday and thursday 01/13/2020: Due today, takes in the evening every other day   budesonide-formoterol (SYMBICORT) 160-4.5 MCG/ACT inhaler Inhale 2 puffs into the lungs 2 (two) times daily.     No facility-administered encounter  medications on file as of 04/04/2022.    Surgical History: Past Surgical History:  Procedure Laterality Date   LUNG TRANSPLANT      Medical History: Past Medical History:  Diagnosis Date   BPH (benign prostatic hyperplasia)    Chest pain, unspecified    Diabetes mellitus without complication ([place])    Dyspnea    Hyperlipidemia    Hypertension     Family History: Family History  Problem Relation Age of Onset   Coronary artery disease Other    Diabetes Other     Social History   Socioeconomic History   Marital status: Married    Spouse name: Not on file   Number of children: Not on file   Years of education: Not on file   Highest education level: Not on file  Occupational History   Not on file  Tobacco Use   Smoking status: Former    Types: Cigarettes    Quit date: 05/16/1979    Years since quitting: 42.9   Smokeless tobacco: Never   Tobacco comments:    Quit 1980  Vaping Use   Vaping Use: Never used  Substance and Sexual Activity   Alcohol use: Not Currently   Drug use: No   Sexual activity: Not on file  Other Topics Concern   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    Social Determinants of Health   Financial Resource Strain: Not on file  Food Insecurity: Not on file  Transportation Needs: Not on file  Physical Activity: Not on file  Stress: Not on file  Social Connections: Not on file  Intimate Partner Violence: Not on file      Review of Systems  Constitutional:  Negative for activity change, appetite change, chills, fatigue, fever and unexpected weight change.  HENT: Negative.  Negative for congestion, ear pain, rhinorrhea, sore throat and trouble swallowing.   Eyes: Negative.   Respiratory:  Positive for cough, shortness of breath and wheezing. Negative for chest tightness.        On continuous supplemental oxygen  Cardiovascular: Negative.  Negative for chest pain.  Gastrointestinal:  Negative.  Negative for abdominal pain, blood in stool, constipation, diarrhea, nausea and vomiting.  Endocrine: Negative.   Genitourinary: Negative.  Negative for difficulty urinating, dysuria, frequency, hematuria and urgency.  Musculoskeletal: Negative.  Negative for arthralgias, back pain, joint swelling, myalgias and neck pain.  Skin: Negative.  Negative for rash and wound.  Allergic/Immunologic: Negative.  Negative for immunocompromised state.  Neurological: Negative.  Negative for dizziness, seizures, numbness and headaches.  Hematological: Negative.   Psychiatric/Behavioral: Negative.  Negative for behavioral problems, self-injury and suicidal ideas.  The patient is not nervous/anxious.     Vital Signs: BP 133/73   Pulse 84   Temp (!) 97 F (36.1 C)   Resp 16   Ht '5\' 9"'$  (1.753 m)   Wt 212 lb 3.2 oz (96.3 kg)   SpO2 97% Comment: 3L  BMI 31.34 kg/m    Physical Exam Vitals reviewed.  Constitutional:      General: He is awake. He is not in acute distress.    Appearance: Normal appearance. He is well-developed and well-groomed. He is obese. He is not ill-appearing or diaphoretic.     Interventions: Nasal cannula in place.  HENT:     Head: Normocephalic and atraumatic.     Right Ear: Tympanic membrane, ear canal and external ear normal.     Left Ear: Tympanic membrane, ear canal and external ear normal.     Nose: Nose normal. No congestion or rhinorrhea.     Mouth/Throat:     Lips: Pink.     Mouth: Mucous membranes are moist.     Pharynx: Oropharynx is clear. Uvula midline. No oropharyngeal exudate or posterior oropharyngeal erythema.  Eyes:     General: Lids are normal. Vision grossly intact. Gaze aligned appropriately. No scleral icterus.       Right eye: No discharge.        Left eye: No discharge.     Extraocular Movements: Extraocular movements intact.     Conjunctiva/sclera: Conjunctivae normal.     Pupils: Pupils are equal, round, and reactive to light.      Funduscopic exam:    Right eye: Red reflex present.        Left eye: Red reflex present. Neck:     Thyroid: No thyromegaly.     Vascular: No carotid bruit or JVD.     Trachea: Trachea and phonation normal. No tracheal deviation.  Cardiovascular:     Rate and Rhythm: Normal rate and regular rhythm.     Pulses: Normal pulses.     Heart sounds: Normal heart sounds, S1 normal and S2 normal. No murmur heard.    No friction rub. No gallop.  Pulmonary:     Effort: Accessory muscle usage present. No respiratory distress.     Breath sounds: Normal air entry. No stridor. Examination of the left-upper field reveals rales. Examination of the right-middle field reveals decreased breath sounds. Examination of the left-middle field reveals rales. Examination of the right-lower field reveals decreased breath sounds. Examination of the left-lower field reveals rales. Decreased breath sounds and rales present. No wheezing.  Chest:     Chest wall: No tenderness.  Abdominal:     General: Bowel sounds are normal. There is no distension.     Palpations: Abdomen is soft. There is no shifting dullness, fluid wave, mass or pulsatile mass.     Tenderness: There is no abdominal tenderness. There is no guarding or rebound.  Musculoskeletal:        General: No tenderness or deformity. Normal range of motion.     Cervical back: Normal range of motion and neck supple.  Lymphadenopathy:     Cervical: No cervical adenopathy.  Skin:    General: Skin is warm and dry.     Capillary Refill: Capillary refill takes less than 2 seconds.     Coloration: Skin is not pale.     Findings: No erythema or rash.  Neurological:     Mental Status: He is alert and oriented to person, place, and time.  Cranial Nerves: No cranial nerve deficit.     Motor: No abnormal muscle tone.     Coordination: Coordination normal.     Gait: Gait normal.     Deep Tendon Reflexes: Reflexes are normal and symmetric.  Psychiatric:        Mood  and Affect: Mood and affect normal.        Behavior: Behavior normal. Behavior is cooperative.        Thought Content: Thought content normal.        Judgment: Judgment normal.        Assessment/Plan: 1. Encounter for routine adult health examination with abnormal findings Age-appropriate preventive screenings and vaccinations discussed, annual physical exam completed. Routine labs for health maintenance are up to date. PHM updated.   2. 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  3. Essential hypertension Stable, continue current medications as prescribed   General Counseling: densel kronick understanding of the findings of todays visit and agrees with plan of treatment. I have discussed any further diagnostic evaluation that may be needed or ordered today. We also reviewed his medications today. he has been encouraged to call the office with any questions or concerns that should arise related to todays visit.    No orders of the defined types were placed in this encounter.   No orders of the defined types were placed in this encounter.   Return in about 6 months (around 10/03/2022) for F/U, [place] Weekly PCP.   Total time spent:30 Minutes Time spent includes review of chart, medications, test results, and follow up plan with the patient.   [place] Controlled Substance Database was reviewed by me.  This patient was seen by Jonetta Osgood, FNP-C in collaboration with Dr. [place] Bigness as a part of collaborative care agreement.   Raysha Tilmon R. Valetta Fuller, MSN, FNP-C Internal medicine

## 2022-04-06 ENCOUNTER — Ambulatory Visit
Admission: RE | Admit: 2022-04-06 | Discharge: 2022-04-06 | Disposition: A | Discharge status: Home / Self Care | Payer: Medicare Other | Source: Ambulatory Visit | Attending: Urology | Admitting: Urology

## 2022-04-06 DIAGNOSIS — R3914 Feeling of incomplete bladder emptying: Secondary | ICD-10-CM | POA: Diagnosis not present

## 2022-04-06 DIAGNOSIS — N189 Chronic kidney disease, unspecified: Secondary | ICD-10-CM | POA: Diagnosis not present

## 2022-04-06 DIAGNOSIS — R339 Retention of urine, unspecified: Secondary | ICD-10-CM | POA: Diagnosis not present

## 2022-04-06 DIAGNOSIS — N184 Chronic kidney disease, stage 4 (severe): Secondary | ICD-10-CM | POA: Diagnosis not present

## 2022-04-06 DIAGNOSIS — N3289 Other specified disorders of bladder: Secondary | ICD-10-CM | POA: Diagnosis not present

## 2022-04-07 NOTE — Discharge Instructions (Signed)

## 2022-04-08 ENCOUNTER — Encounter: Payer: Self-pay | Admitting: Nurse Practitioner

## 2022-04-08 DIAGNOSIS — T86811 Lung transplant failure: Secondary | ICD-10-CM | POA: Insufficient documentation

## 2022-04-11 ENCOUNTER — Other Ambulatory Visit: Payer: Self-pay

## 2022-04-11 ENCOUNTER — Ambulatory Visit: Payer: Medicare Other | Admitting: Anesthesiology

## 2022-04-11 ENCOUNTER — Encounter: Payer: Self-pay | Admitting: Ophthalmology

## 2022-04-11 ENCOUNTER — Ambulatory Visit
Admission: RE | Admit: 2022-04-11 | Discharge: 2022-04-11 | Disposition: A | Discharge status: Home / Self Care | Payer: Medicare Other | Attending: Ophthalmology | Admitting: Ophthalmology

## 2022-04-11 ENCOUNTER — Encounter
Admission: RE | Disposition: A | Discharge status: Home / Self Care | Payer: Self-pay | Source: Home / Self Care | Attending: Ophthalmology

## 2022-04-11 DIAGNOSIS — I5032 Chronic diastolic (congestive) heart failure: Secondary | ICD-10-CM | POA: Diagnosis not present

## 2022-04-11 DIAGNOSIS — I130 Hypertensive heart and chronic kidney disease with heart failure and stage 1 through stage 4 chronic kidney disease, or unspecified chronic kidney disease: Secondary | ICD-10-CM | POA: Insufficient documentation

## 2022-04-11 DIAGNOSIS — H2512 Age-related nuclear cataract, left eye: Secondary | ICD-10-CM | POA: Diagnosis not present

## 2022-04-11 DIAGNOSIS — I509 Heart failure, unspecified: Secondary | ICD-10-CM | POA: Diagnosis not present

## 2022-04-11 DIAGNOSIS — E1122 Type 2 diabetes mellitus with diabetic chronic kidney disease: Secondary | ICD-10-CM | POA: Insufficient documentation

## 2022-04-11 DIAGNOSIS — N184 Chronic kidney disease, stage 4 (severe): Secondary | ICD-10-CM | POA: Insufficient documentation

## 2022-04-11 DIAGNOSIS — H269 Unspecified cataract: Secondary | ICD-10-CM | POA: Diagnosis not present

## 2022-04-11 DIAGNOSIS — E1136 Type 2 diabetes mellitus with diabetic cataract: Secondary | ICD-10-CM | POA: Diagnosis not present

## 2022-04-11 DIAGNOSIS — H2181 Floppy iris syndrome: Secondary | ICD-10-CM | POA: Insufficient documentation

## 2022-04-11 HISTORY — DX: Chronic obstructive pulmonary disease, unspecified: J44.9

## 2022-04-11 HISTORY — DX: Chronic kidney disease, stage 4 (severe): N18.4

## 2022-04-11 HISTORY — DX: Chronic diastolic (congestive) heart failure: I50.32

## 2022-04-11 HISTORY — DX: Interstitial pulmonary disease, unspecified: J84.9

## 2022-04-11 HISTORY — DX: Sleep apnea, unspecified: G47.30

## 2022-04-11 HISTORY — DX: Lung transplant rejection: T86.810

## 2022-04-11 HISTORY — DX: Unspecified atrial fibrillation: I48.91

## 2022-04-11 HISTORY — PX: CATARACT EXTRACTION W/PHACO: SHX586

## 2022-04-11 LAB — GLUCOSE, CAPILLARY: Glucose-Capillary: 164 mg/dL — ABNORMAL HIGH (ref 70–99)

## 2022-04-11 SURGERY — PHACOEMULSIFICATION, CATARACT, WITH IOL INSERTION
Anesthesia: Monitor Anesthesia Care | Site: Eye | Laterality: Left

## 2022-04-11 MED ORDER — LIDOCAINE HCL (PF) 2 % IJ SOLN
INTRAOCULAR | Status: DC | PRN
  Administered 2022-04-11: 1 mL via INTRAOCULAR

## 2022-04-11 MED ORDER — TETRACAINE HCL 0.5 % OP SOLN
1.0000 [drp] | OPHTHALMIC | Status: AC | PRN
  Administered 2022-04-11 (×3): 1 [drp] via OPHTHALMIC

## 2022-04-11 MED ORDER — ARMC OPHTHALMIC DILATING DROPS
1.0000 | OPHTHALMIC | Status: DC | PRN
  Administered 2022-04-11 (×2): 1 via OPHTHALMIC

## 2022-04-11 MED ORDER — SIGHTPATH DOSE#1 NA HYALUR & NA CHOND-NA HYALUR IO KIT
PACK | INTRAOCULAR | Status: DC | PRN
  Administered 2022-04-11: 1 via OPHTHALMIC

## 2022-04-11 MED ORDER — MOXIFLOXACIN HCL 0.5 % OP SOLN
OPHTHALMIC | Status: DC | PRN
  Administered 2022-04-11: .2 mL via OPHTHALMIC

## 2022-04-11 MED ORDER — SIGHTPATH DOSE#1 BSS IO SOLN
INTRAOCULAR | Status: DC | PRN
  Administered 2022-04-11: 15 mL

## 2022-04-11 MED ORDER — ONDANSETRON HCL 4 MG/2ML IJ SOLN: 4.0000 mg | Freq: Once | INTRAMUSCULAR | Status: DC | PRN

## 2022-04-11 MED ORDER — SIGHTPATH DOSE#1 BSS IO SOLN
INTRAOCULAR | Status: DC | PRN
  Administered 2022-04-11: 118 mL via OPHTHALMIC

## 2022-04-11 MED ORDER — MIDAZOLAM HCL 2 MG/2ML IJ SOLN
INTRAMUSCULAR | Status: DC | PRN
  Administered 2022-04-11: 1 mg via INTRAVENOUS

## 2022-04-11 SURGICAL SUPPLY — 14 items
CATARACT SUITE SIGHTPATH (MISCELLANEOUS) ×1 IMPLANT
DISSECTOR HYDRO NUCLEUS 50X22 (MISCELLANEOUS) ×1 IMPLANT
FEE CATARACT SUITE SIGHTPATH (MISCELLANEOUS) ×1 IMPLANT
GLOVE SURG GAMMEX PI TX LF 7.5 (GLOVE) ×1 IMPLANT
GLOVE SURG SYN 8.5  E (GLOVE) ×1
GLOVE SURG SYN 8.5 E (GLOVE) ×1 IMPLANT
GLOVE SURG SYN 8.5 PF PI (GLOVE) ×1 IMPLANT
LENS IOL TECNIS EYHANCE 23.0 (Intraocular Lens) IMPLANT
NDL FILTER BLUNT 18X1 1/2 (NEEDLE) ×1 IMPLANT
NEEDLE FILTER BLUNT 18X1 1/2 (NEEDLE) ×1 IMPLANT
RING MALYGIN (MISCELLANEOUS) IMPLANT
SYR 3ML LL SCALE MARK (SYRINGE) ×1 IMPLANT
SYR 5ML LL (SYRINGE) ×1 IMPLANT
WATER STERILE IRR 250ML POUR (IV SOLUTION) ×1 IMPLANT

## 2022-04-11 NOTE — H&P (Signed)
[place]   Primary Care Physician:  Lavera Guise, MD Ophthalmologist: Dr. Benay Pillow  Pre-Procedure History & Physical: HPI:  Caleb [name] is a 76 y.o. male here for cataract surgery.   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   Atrial fibrillation (HCC)    BPH (benign prostatic hyperplasia)    Chest pain, unspecified    Chronic heart failure with preserved ejection fraction (HCC)    Chronic rejection of allograft lung (HCC)    CKD (chronic kidney disease), stage IV (HCC)    COPD (chronic obstructive pulmonary disease) ([place])    Diabetes mellitus without complication ([place])    Dyspnea    Hyperlipidemia    Hypertension    Interstitial lung disease ([place])    Lung transplant recipient [facility]) 2016   [facility] - Right   Sleep apnea     Past Surgical History:  Procedure Laterality Date   BRONCHOSCOPY     multiple   CARDIAC DEFIBRILLATOR PLACEMENT  10/18/2019   [facility]   HIP FRACTURE SURGERY Right 12/2019   [facility]   LUNG TRANSPLANT      Prior to Admission medications   Medication Sig Start Date End Date Taking? Authorizing Provider  acetaminophen (TYLENOL) 325 MG tablet Take 650 mg by mouth every 6 (six) hours as needed.   Yes [provider]  albuterol (VENTOLIN HFA) 108 (90 Base) MCG/ACT inhaler Inhale 2 puffs into the lungs every 6 (six) hours as needed for wheezing or shortness of breath.   Yes [provider]  apixaban (ELIQUIS) 2.5 MG TABS tablet Take 2.5 mg by mouth 2 (two) times daily.   Yes [provider]  azithromycin (ZITHROMAX) 250 MG tablet Take by mouth. Take 1 tab by po 3 times a week   Yes [provider]  budesonide-formoterol (SYMBICORT) 160-4.5 MCG/ACT inhaler Inhale 2 puffs into the lungs 2 (two) times daily.  06/05/18 04/11/22 Yes [provider]  calcium acetate, Phos Binder, (PHOSLYRA) 667 MG/5ML SOLN Take 1,334 mg by mouth 3 (three) times daily  with meals.   Yes [provider]  Cholecalciferol (VITAMIN D3) 125 MCG (5000 UT) TBDP Take by mouth.   Yes [provider]  ferrous sulfate 324 MG TBEC Take 324 mg by mouth daily. With lunch   Yes [provider]  finasteride (PROSCAR) 5 MG tablet Take 5 mg by mouth daily.   Yes [provider]  FLUoxetine (PROZAC) 10 MG capsule Take 10 mg by mouth daily. 07/06/21  Yes [provider]  glucagon 1 MG injection For emergency use 02/28/20  Yes [provider]  insulin lispro (HUMALOG) 100 UNIT/ML KwikPen Inject into the skin. 08/22/21 08/22/22 Yes [provider]  ketoconazole (NIZORAL) 2 % cream Apply 1 Application topically 2 (two) times daily. foot   Yes [provider]  Magnesium 200 MG TABS Take by mouth. Take 1 tab po daily   Yes [provider]  mometasone-formoterol (DULERA) 200-5 MCG/ACT AERO Inhale into the lungs. 07/09/21  Yes [provider]  Multiple Vitamin (MULTI-VITAMIN) tablet Take 1 tablet by mouth daily. 08/19/19  Yes [provider]  mycophenolate (CELLCEPT) 250 MG capsule Take by mouth 2 (two) times daily.   Yes [provider]  OXYGEN Inhale 2 L into the lungs. At night and with exertion   Yes [provider]  pantoprazole (PROTONIX) 40 MG tablet Take 40 mg by mouth 2 (two) times daily.  Yes [provider]  polyethylene glycol (MIRALAX / GLYCOLAX) 17 g packet Take 17 g by mouth daily.   Yes [provider]  predniSONE (DELTASONE) 5 MG tablet Take 5 mg by mouth daily with breakfast.   Yes [provider]  propranolol (INDERAL) 20 MG tablet Take 20 mg by mouth 2 (two) times daily.   Yes [provider]  rosuvastatin (CRESTOR) 10 MG tablet Take 10 mg by mouth daily.   Yes [provider]  senna-docusate (SENOKOT-S) 8.6-50 MG tablet Take 1 tablet by mouth 2 (two) times daily.   Yes [provider]   sulfamethoxazole-trimethoprim (BACTRIM) 400-80 MG tablet Take 1 tablet by mouth 3 (three) times a week. Mon, Wed, Fri   Yes [provider]  tacrolimus (PROGRAF) 0.5 MG capsule Take in combination with '1mg'$  capsules for total dose of '1mg'$  by mouth daily and 1.'5mg'$  by mouth nightly. 02/04/20  Yes [provider]  tacrolimus (PROGRAF) 1 MG capsule Take 1 mg by mouth 2 (two) times daily.  02/01/18  Yes [provider]  tamsulosin (FLOMAX) 0.4 MG CAPS Take 0.4 mg by mouth daily after breakfast. 1 on saturdays   Yes [provider]  torsemide (DEMADEX) 20 MG tablet Take '60mg'$  by mouth twice daily x 3 days (9/3-9/5), then decrease to '40mg'$  by mouth twice daily.  Take additional '40mg'$  by mouth if weight gain increases by 2 lbs in 1 day or 5 lbs in 1 week. 01/31/20  Yes [provider]  valGANciclovir (VALCYTE) 450 MG tablet Take 450 mg by mouth every other day. Take every Monday and thursday   Yes [provider]  Insulin Syringe-Needle U-100 (BD VEO INSULIN SYRINGE U/F) 31G X 15/64" 0.3 ML MISC Use 1 Syringe 4 (four) times daily 12/16/19   [provider]    Allergies as of 03/25/2022 - Review Complete 11/07/2021  Allergen Reaction Noted   Nsaids Other (See Comments) 07/29/2014    Family History  Problem Relation Age of Onset   Coronary artery disease Other    Diabetes Other     Social History   Socioeconomic History   Marital status: Married    Spouse name: Not on file   Number of children: Not on file   Years of education: Not on file   Highest education level: Not on file  Occupational History   Not on file  Tobacco Use   Smoking status: Former    Types: Cigarettes    Quit date: 05/16/1979    Years since quitting: 42.9   Smokeless tobacco: Never   Tobacco comments:    Quit 1980  Vaping Use   Vaping Use: Never used  Substance and Sexual Activity   Alcohol use: Not Currently   Drug use: No   Sexual activity: Not on file  Other  Topics Concern   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    Social Determinants of Health   Financial Resource Strain: Not on file  Food Insecurity: Not on file  Transportation Needs: Not on file  Physical Activity: Not on file  Stress: Not on file  Social Connections: Not on file  Intimate Partner Violence: Not on file    Review of Systems: See HPI, otherwise negative ROS  Physical Exam: BP (!) 174/88   Pulse 78   Temp 98.2 F (36.8 C)   Ht '5\' 9"'$  (1.753 m)   Wt 95.3 kg  SpO2 100%   BMI 31.01 kg/m  General:   Alert, cooperative in NAD Head:  Normocephalic and atraumatic. Respiratory:  Normal work of breathing. Cardiovascular:  RRR  Impression/Plan: Caleb [name] is here for cataract surgery.  Risks, benefits, limitations, and alternatives regarding cataract surgery have been reviewed with the patient.  Questions have been answered.  All parties agreeable.   Benay Pillow, MD  04/11/2022, 10:26 AM

## 2022-04-11 NOTE — Anesthesia Postprocedure Evaluation (Signed)
Anesthesia Post Note  Patient: DVID PENDRY  Procedure(s) Performed: CATARACT EXTRACTION PHACO AND INTRAOCULAR LENS PLACEMENT (IOC) LEFT DIABETIC (Left: Eye)  Patient location during evaluation: PACU Anesthesia Type: MAC Level of consciousness: awake and alert Pain management: pain level controlled Vital Signs Assessment: post-procedure vital signs reviewed and stable Respiratory status: spontaneous breathing, nonlabored ventilation, respiratory function stable and patient connected to nasal cannula oxygen Cardiovascular status: stable and blood pressure returned to baseline Postop Assessment: no apparent nausea or vomiting Anesthetic complications: no   There were no known notable events for this encounter.   Last Vitals:  Vitals:   04/11/22 1115 04/11/22 1124  BP: (!) 144/84 (!) 167/75  Pulse: 70   Resp: 18   Temp: (!) 36.2 C (!) 36.4 C  SpO2: 100%     Last Pain:  Vitals:   04/11/22 1124  PainSc: 0-No pain                 Arita Miss

## 2022-04-11 NOTE — Anesthesia Preprocedure Evaluation (Signed)
Anesthesia Evaluation  Patient identified by MRN, date of birth, ID band Patient awake    Reviewed: Allergy & Precautions, NPO status , Patient's Chart, lab work & pertinent test results  History of Anesthesia Complications Negative for: history of anesthetic complications  Airway Mallampati: II  TM Distance: >3 FB Neck ROM: Full    Dental no notable dental hx. (+) Teeth Intact   Pulmonary shortness of breath, at rest and Long-Term Oxygen Therapy, sleep apnea , COPD,  COPD inhaler and oxygen dependent, Patient abstained from smoking.Not current smoker, former smoker S/p single lung transplant complicated by rejection. On average uses 2L/min of oxygen, sometimes more. Breathing feels at baseline today, and he says he can lay flat.    + decreased breath sounds      Cardiovascular Exercise Tolerance: Good METShypertension, Pt. on medications (-) CAD and (-) Past MI + dysrhythmias Atrial Fibrillation + Cardiac Defibrillator  Rhythm:Regular Rate:Normal - Systolic murmurs TTE 2130: NORMAL LEFT VENTRICULAR SYSTOLIC FUNCTION WITH MILD LVH    NORMAL RIGHT VENTRICULAR SYSTOLIC FUNCTION    VALVULAR REGURGITATION: TRIVIAL MR, TRIVIAL PR, MILD TR    NO VALVULAR STENOSIS     Neuro/Psych negative neurological ROS  negative psych ROS   GI/Hepatic ,neg GERD  ,,(+)     (-) substance abuse    Endo/Other  diabetes    Renal/GU CRFRenal disease     Musculoskeletal   Abdominal   Peds  Hematology   Anesthesia Other Findings Past Medical History: 10/18/2019: AICD (automatic cardioverter/defibrillator) present No date: Atrial fibrillation (HCC) No date: BPH (benign prostatic hyperplasia) No date: Chest pain, unspecified No date: Chronic heart failure with preserved ejection fraction (HCC) No date: Chronic rejection of allograft lung (HCC) No date: CKD (chronic kidney disease), stage IV (HCC) No date: COPD (chronic obstructive  pulmonary disease) (HCC) No date: Diabetes mellitus without complication (HCC) No date: Dyspnea No date: Hyperlipidemia No date: Hypertension No date: Interstitial lung disease ([place]) 2016: Lung transplant recipient [facility])     Comment:  [facility] - Right No date: Sleep apnea  Reproductive/Obstetrics                              Anesthesia Physical Anesthesia Plan  ASA: 4  Anesthesia Plan: MAC   Post-op Pain Management:    Induction: Intravenous  PONV Risk Score and Plan: 1 and Midazolam  Airway Management Planned: Nasal Cannula  Additional Equipment:   Intra-op Plan:   Post-operative Plan:   Informed Consent: I have reviewed the patients History and Physical, chart, labs and discussed the procedure including the risks, benefits and alternatives for the proposed anesthesia with the patient or authorized representative who has indicated his/her understanding and acceptance.       Plan Discussed with: CRNA and Surgeon  Anesthesia Plan Comments: (Explained risks of anesthesia, including PONV, and rare emergencies such as cardiac events, respiratory problems, and allergic reactions, requiring invasive intervention. Discussed the role of CRNA in patient's perioperative care. Patient understands. Patient counseled on being higher risk for anesthesia due to comorbidities: lung transplant rejection and O2 requirement. Patient was told about increased risk of cardiac and respiratory events, including death. Told patient that if he is unable to lay flat, we would have to cancel the case because the other option would be GETA, which would be safer done at a hospital. )         Anesthesia Quick Evaluation

## 2022-04-11 NOTE — Transfer of Care (Signed)
Immediate Anesthesia Transfer of Care Note  Patient: Caleb [name]  Procedure(s) Performed: CATARACT EXTRACTION PHACO AND INTRAOCULAR LENS PLACEMENT (IOC) LEFT DIABETIC (Left: Eye)  Patient Location: PACU  Anesthesia Type: MAC  Level of Consciousness: awake, alert  and patient cooperative  Airway and Oxygen Therapy: Patient Spontanous Breathing and Patient connected to supplemental oxygen  Post-op Assessment: Post-op Vital signs reviewed, Patient's Cardiovascular Status Stable, Respiratory Function Stable, Patent Airway and No signs of Nausea or vomiting  Post-op Vital Signs: Reviewed and stable  Complications: There were no known notable events for this encounter.

## 2022-04-11 NOTE — Op Note (Signed)
OPERATIVE NOTE  EDI GORNIAK [id number] 04/11/2022   PREOPERATIVE DIAGNOSIS:  Nuclear sclerotic cataract left eye.  H25.12   POSTOPERATIVE DIAGNOSIS:      Nuclear sclerotic cataract left eye.  H25.12  Intraoperative floppy iris syndrome.   PROCEDURE:  CPT [phone] Complex phacoemusification with posterior chamber intraocular lens placement of the left eye, requiring use of malyugin ring for dilation and stabilization of floppy iris.  LENS:   Implant Name Type Inv. Item Serial No. Manufacturer Lot No. LRB No. Used Action  LENS IOL TECNIS EYHANCE 23.0 - [id number] Intraocular Lens LENS IOL TECNIS EYHANCE 23.0 [id number] [employer]  Left 1 Implanted      Procedure(s) with comments: CATARACT EXTRACTION PHACO AND INTRAOCULAR LENS PLACEMENT (IOC) LEFT DIABETIC (Left) - 4.20 0:46.5  DIB00 +23.0   ULTRASOUND TIME: 0 minutes 46 seconds.  CDE 4.20   SURGEON:  Benay Pillow, MD, MPH   ANESTHESIA:  Topical with tetracaine drops augmented with 1% preservative-free intracameral lidocaine.  ESTIMATED BLOOD LOSS: <1 mL   COMPLICATIONS:  None.   DESCRIPTION OF PROCEDURE:  The patient was identified in the holding room and transported to the operating room and placed in the supine position under the operating microscope.  The left eye was identified as the operative eye and it was prepped and draped in the usual sterile ophthalmic fashion.   A 1.0 millimeter clear-corneal paracentesis was made at the 5:00 position. 0.5 ml of preservative-free 1% lidocaine with epinephrine was injected into the anterior chamber.  The anterior chamber was filled with viscoelastic.  A 2.4 millimeter keratome was used to make a near-clear corneal incision at the 2:00 position.    The pupil was small and the iris was floppy so a malyugin ring was placed without difficulty.  A curvilinear capsulorrhexis was made with a cystotome and capsulorrhexis forceps.  Balanced salt solution was used to hydrodissect and  hydrodelineate the nucleus.   Phacoemulsification was then used in stop and chop fashion to remove the lens nucleus and epinucleus.  The remaining cortex was then removed using the irrigation and aspiration handpiece. Viscoelastic was then placed into the capsular bag to distend it for lens placement.  A lens was then injected into the capsular bag.    The malyugin ring was removed without difficutly. The remaining viscoelastic was aspirated.   Wounds were hydrated with balanced salt solution.  The anterior chamber was inflated to a physiologic pressure with balanced salt solution.  Intracameral vigamox 0.1 mL undiltued was injected into the eye and a drop placed onto the ocular surface.  No wound leaks were noted.  The patient was taken to the recovery room in stable condition without complications of anesthesia or surgery  Benay Pillow 04/11/2022, 11:16 AM

## 2022-04-12 ENCOUNTER — Telehealth: Payer: Self-pay

## 2022-04-12 ENCOUNTER — Encounter: Payer: Self-pay | Admitting: Ophthalmology

## 2022-04-12 DIAGNOSIS — R339 Retention of urine, unspecified: Secondary | ICD-10-CM | POA: Diagnosis not present

## 2022-04-12 NOTE — Telephone Encounter (Signed)
Left message for patient to call back regarding results.

## 2022-04-12 NOTE — Telephone Encounter (Signed)
Patient notified of results and voiced understanding. 

## 2022-04-12 NOTE — Telephone Encounter (Signed)
-----   Message from Hollice Espy, MD sent at 04/11/2022  9:08 AM EST ----- Structure of kidneys looks fine.  Prostate is enlarged and your bladder looks like it has been working hard for many years to compensate for this.    Hollice Espy, MD

## 2022-04-12 NOTE — Telephone Encounter (Signed)
Patient's wife called and states patient is at [facility], he went for a second opinion on having surgery. They are putting a catheter in to be changed monthly. She states he is not a candidate for surgery, he is at high risk if put to sleep. She state he is hard to get around and [facility] is going to put a request in for Home Health to come to the house for monthly changes. I informed her that if they are unable to do the monthly changes at home they are welcome to come her as this location is closer to their home. They will call if they need [place].

## 2022-04-14 ENCOUNTER — Encounter: Payer: Self-pay | Admitting: Ophthalmology

## 2022-04-15 ENCOUNTER — Telehealth: Payer: Self-pay

## 2022-04-15 DIAGNOSIS — Z942 Lung transplant status: Secondary | ICD-10-CM | POA: Diagnosis not present

## 2022-04-15 DIAGNOSIS — D509 Iron deficiency anemia, unspecified: Secondary | ICD-10-CM | POA: Diagnosis not present

## 2022-04-15 DIAGNOSIS — Z9981 Dependence on supplemental oxygen: Secondary | ICD-10-CM | POA: Diagnosis not present

## 2022-04-15 DIAGNOSIS — Z87891 Personal history of nicotine dependence: Secondary | ICD-10-CM | POA: Diagnosis not present

## 2022-04-15 DIAGNOSIS — R338 Other retention of urine: Secondary | ICD-10-CM | POA: Diagnosis not present

## 2022-04-15 DIAGNOSIS — Z794 Long term (current) use of insulin: Secondary | ICD-10-CM | POA: Diagnosis not present

## 2022-04-15 DIAGNOSIS — N401 Enlarged prostate with lower urinary tract symptoms: Secondary | ICD-10-CM | POA: Diagnosis not present

## 2022-04-15 DIAGNOSIS — I5022 Chronic systolic (congestive) heart failure: Secondary | ICD-10-CM | POA: Diagnosis not present

## 2022-04-15 DIAGNOSIS — S0180XD Unspecified open wound of other part of head, subsequent encounter: Secondary | ICD-10-CM | POA: Diagnosis not present

## 2022-04-15 DIAGNOSIS — Z7901 Long term (current) use of anticoagulants: Secondary | ICD-10-CM | POA: Diagnosis not present

## 2022-04-15 DIAGNOSIS — Z95810 Presence of automatic (implantable) cardiac defibrillator: Secondary | ICD-10-CM | POA: Diagnosis not present

## 2022-04-15 DIAGNOSIS — Z466 Encounter for fitting and adjustment of urinary device: Secondary | ICD-10-CM | POA: Diagnosis not present

## 2022-04-15 DIAGNOSIS — N184 Chronic kidney disease, stage 4 (severe): Secondary | ICD-10-CM | POA: Diagnosis not present

## 2022-04-15 DIAGNOSIS — J449 Chronic obstructive pulmonary disease, unspecified: Secondary | ICD-10-CM | POA: Diagnosis not present

## 2022-04-15 DIAGNOSIS — E1122 Type 2 diabetes mellitus with diabetic chronic kidney disease: Secondary | ICD-10-CM | POA: Diagnosis not present

## 2022-04-15 DIAGNOSIS — K5909 Other constipation: Secondary | ICD-10-CM | POA: Diagnosis not present

## 2022-04-15 DIAGNOSIS — E661 Drug-induced obesity: Secondary | ICD-10-CM | POA: Diagnosis not present

## 2022-04-15 DIAGNOSIS — Z6832 Body mass index (BMI) 32.0-32.9, adult: Secondary | ICD-10-CM | POA: Diagnosis not present

## 2022-04-15 DIAGNOSIS — Z85828 Personal history of other malignant neoplasm of skin: Secondary | ICD-10-CM | POA: Diagnosis not present

## 2022-04-15 DIAGNOSIS — Z7952 Long term (current) use of systemic steroids: Secondary | ICD-10-CM | POA: Diagnosis not present

## 2022-04-15 DIAGNOSIS — J8410 Pulmonary fibrosis, unspecified: Secondary | ICD-10-CM | POA: Diagnosis not present

## 2022-04-15 DIAGNOSIS — G4733 Obstructive sleep apnea (adult) (pediatric): Secondary | ICD-10-CM | POA: Diagnosis not present

## 2022-04-15 DIAGNOSIS — E785 Hyperlipidemia, unspecified: Secondary | ICD-10-CM | POA: Diagnosis not present

## 2022-04-15 DIAGNOSIS — I130 Hypertensive heart and chronic kidney disease with heart failure and stage 1 through stage 4 chronic kidney disease, or unspecified chronic kidney disease: Secondary | ICD-10-CM | POA: Diagnosis not present

## 2022-04-15 DIAGNOSIS — K219 Gastro-esophageal reflux disease without esophagitis: Secondary | ICD-10-CM | POA: Diagnosis not present

## 2022-04-15 NOTE — Telephone Encounter (Signed)
Gave verbal order for nursing and Physical therapy [phone] and home health order from [facility]

## 2022-04-19 DIAGNOSIS — Z466 Encounter for fitting and adjustment of urinary device: Secondary | ICD-10-CM | POA: Diagnosis not present

## 2022-04-19 DIAGNOSIS — E1122 Type 2 diabetes mellitus with diabetic chronic kidney disease: Secondary | ICD-10-CM | POA: Diagnosis not present

## 2022-04-19 DIAGNOSIS — I5022 Chronic systolic (congestive) heart failure: Secondary | ICD-10-CM | POA: Diagnosis not present

## 2022-04-19 DIAGNOSIS — I130 Hypertensive heart and chronic kidney disease with heart failure and stage 1 through stage 4 chronic kidney disease, or unspecified chronic kidney disease: Secondary | ICD-10-CM | POA: Diagnosis not present

## 2022-04-19 DIAGNOSIS — R338 Other retention of urine: Secondary | ICD-10-CM | POA: Diagnosis not present

## 2022-04-19 DIAGNOSIS — H2511 Age-related nuclear cataract, right eye: Secondary | ICD-10-CM | POA: Diagnosis not present

## 2022-04-19 DIAGNOSIS — N401 Enlarged prostate with lower urinary tract symptoms: Secondary | ICD-10-CM | POA: Diagnosis not present

## 2022-04-19 NOTE — Discharge Instructions (Signed)

## 2022-04-25 ENCOUNTER — Encounter: Payer: Self-pay | Admitting: Ophthalmology

## 2022-04-25 ENCOUNTER — Other Ambulatory Visit: Payer: Self-pay

## 2022-04-25 ENCOUNTER — Telehealth: Payer: Self-pay

## 2022-04-25 ENCOUNTER — Ambulatory Visit
Admission: RE | Admit: 2022-04-25 | Discharge: 2022-04-25 | Disposition: A | Discharge status: Home / Self Care | Payer: Medicare Other | Attending: Ophthalmology | Admitting: Ophthalmology

## 2022-04-25 ENCOUNTER — Ambulatory Visit: Payer: Medicare Other | Admitting: Anesthesiology

## 2022-04-25 ENCOUNTER — Encounter
Admission: RE | Disposition: A | Discharge status: Home / Self Care | Payer: Self-pay | Source: Home / Self Care | Attending: Ophthalmology

## 2022-04-25 DIAGNOSIS — Z87891 Personal history of nicotine dependence: Secondary | ICD-10-CM | POA: Diagnosis not present

## 2022-04-25 DIAGNOSIS — I10 Essential (primary) hypertension: Secondary | ICD-10-CM | POA: Diagnosis not present

## 2022-04-25 DIAGNOSIS — I5032 Chronic diastolic (congestive) heart failure: Secondary | ICD-10-CM | POA: Insufficient documentation

## 2022-04-25 DIAGNOSIS — H2511 Age-related nuclear cataract, right eye: Secondary | ICD-10-CM | POA: Diagnosis not present

## 2022-04-25 DIAGNOSIS — H2181 Floppy iris syndrome: Secondary | ICD-10-CM | POA: Insufficient documentation

## 2022-04-25 DIAGNOSIS — E1136 Type 2 diabetes mellitus with diabetic cataract: Secondary | ICD-10-CM | POA: Insufficient documentation

## 2022-04-25 DIAGNOSIS — Z466 Encounter for fitting and adjustment of urinary device: Secondary | ICD-10-CM | POA: Diagnosis not present

## 2022-04-25 DIAGNOSIS — I130 Hypertensive heart and chronic kidney disease with heart failure and stage 1 through stage 4 chronic kidney disease, or unspecified chronic kidney disease: Secondary | ICD-10-CM | POA: Insufficient documentation

## 2022-04-25 DIAGNOSIS — N184 Chronic kidney disease, stage 4 (severe): Secondary | ICD-10-CM | POA: Diagnosis not present

## 2022-04-25 DIAGNOSIS — J449 Chronic obstructive pulmonary disease, unspecified: Secondary | ICD-10-CM | POA: Insufficient documentation

## 2022-04-25 DIAGNOSIS — E1122 Type 2 diabetes mellitus with diabetic chronic kidney disease: Secondary | ICD-10-CM | POA: Diagnosis not present

## 2022-04-25 DIAGNOSIS — R338 Other retention of urine: Secondary | ICD-10-CM | POA: Diagnosis not present

## 2022-04-25 DIAGNOSIS — I5022 Chronic systolic (congestive) heart failure: Secondary | ICD-10-CM | POA: Diagnosis not present

## 2022-04-25 DIAGNOSIS — Z95810 Presence of automatic (implantable) cardiac defibrillator: Secondary | ICD-10-CM | POA: Insufficient documentation

## 2022-04-25 DIAGNOSIS — N401 Enlarged prostate with lower urinary tract symptoms: Secondary | ICD-10-CM | POA: Diagnosis not present

## 2022-04-25 HISTORY — PX: CATARACT EXTRACTION W/PHACO: SHX586

## 2022-04-25 LAB — GLUCOSE, CAPILLARY: Glucose-Capillary: 169 mg/dL — ABNORMAL HIGH (ref 70–99)

## 2022-04-25 SURGERY — PHACOEMULSIFICATION, CATARACT, WITH IOL INSERTION
Anesthesia: Monitor Anesthesia Care | Site: Eye | Laterality: Right

## 2022-04-25 MED ORDER — SIGHTPATH DOSE#1 BSS IO SOLN
INTRAOCULAR | Status: DC | PRN
  Administered 2022-04-25: 15 mL

## 2022-04-25 MED ORDER — SIGHTPATH DOSE#1 BSS IO SOLN
INTRAOCULAR | Status: DC | PRN
  Administered 2022-04-25: 99 mL via OPHTHALMIC

## 2022-04-25 MED ORDER — MOXIFLOXACIN HCL 0.5 % OP SOLN
OPHTHALMIC | Status: DC | PRN
  Administered 2022-04-25: .2 mL via OPHTHALMIC

## 2022-04-25 MED ORDER — ARMC OPHTHALMIC DILATING DROPS
1.0000 | OPHTHALMIC | Status: DC | PRN
  Administered 2022-04-25 (×3): 1 via OPHTHALMIC

## 2022-04-25 MED ORDER — TETRACAINE HCL 0.5 % OP SOLN
1.0000 [drp] | OPHTHALMIC | Status: DC | PRN
  Administered 2022-04-25 (×3): 1 [drp] via OPHTHALMIC

## 2022-04-25 MED ORDER — FENTANYL CITRATE (PF) 100 MCG/2ML IJ SOLN
INTRAMUSCULAR | Status: DC | PRN
  Administered 2022-04-25: 25 ug via INTRAVENOUS

## 2022-04-25 MED ORDER — LACTATED RINGERS IV SOLN: INTRAVENOUS | Status: DC

## 2022-04-25 MED ORDER — FENTANYL CITRATE PF 50 MCG/ML IJ SOSY: 25.0000 ug | PREFILLED_SYRINGE | INTRAMUSCULAR | Status: DC | PRN

## 2022-04-25 MED ORDER — LIDOCAINE HCL (PF) 2 % IJ SOLN
INTRAOCULAR | Status: DC | PRN
  Administered 2022-04-25: 1 mL via INTRAOCULAR

## 2022-04-25 MED ORDER — MIDAZOLAM HCL 2 MG/2ML IJ SOLN
INTRAMUSCULAR | Status: DC | PRN
  Administered 2022-04-25: .5 mg via INTRAVENOUS

## 2022-04-25 MED ORDER — ONDANSETRON HCL 4 MG/2ML IJ SOLN: 4.0000 mg | Freq: Once | INTRAMUSCULAR | Status: DC | PRN

## 2022-04-25 MED ORDER — SIGHTPATH DOSE#1 NA HYALUR & NA CHOND-NA HYALUR IO KIT
PACK | INTRAOCULAR | Status: DC | PRN
  Administered 2022-04-25: 1 via OPHTHALMIC

## 2022-04-25 SURGICAL SUPPLY — 19 items
CANNULA ANT/CHMB 27G (MISCELLANEOUS) IMPLANT
CANNULA ANT/CHMB 27GA (MISCELLANEOUS) IMPLANT
CATARACT SUITE SIGHTPATH (MISCELLANEOUS) ×1 IMPLANT
DISSECTOR HYDRO NUCLEUS 50X22 (MISCELLANEOUS) ×1 IMPLANT
FEE CATARACT SUITE SIGHTPATH (MISCELLANEOUS) ×1 IMPLANT
GLOVE SURG GAMMEX PI TX LF 7.5 (GLOVE) ×1 IMPLANT
GLOVE SURG SYN 8.5  E (GLOVE) ×1
GLOVE SURG SYN 8.5 E (GLOVE) ×1 IMPLANT
GLOVE SURG SYN 8.5 PF PI (GLOVE) ×1 IMPLANT
LENS IOL TECNIS EYHANCE 23.0 (Intraocular Lens) IMPLANT
NDL FILTER BLUNT 18X1 1/2 (NEEDLE) ×1 IMPLANT
NEEDLE FILTER BLUNT 18X1 1/2 (NEEDLE) ×1 IMPLANT
PACK VIT ANT 23G (MISCELLANEOUS) IMPLANT
RING MALYGIN (MISCELLANEOUS) IMPLANT
SUT ETHILON 10-0 CS-B-6CS-B-6 (SUTURE)
SUTURE EHLN 10-0 CS-B-6CS-B-6 (SUTURE) IMPLANT
SYR 3ML LL SCALE MARK (SYRINGE) ×1 IMPLANT
SYR 5ML LL (SYRINGE) ×1 IMPLANT
WATER STERILE IRR 250ML POUR (IV SOLUTION) ×1 IMPLANT

## 2022-04-25 NOTE — H&P (Signed)
[place]   Primary Care Physician:  Lavera Guise, MD Ophthalmologist: Dr. Benay Pillow  Pre-Procedure History & Physical: HPI:  Caleb [name] is a 76 y.o. male here for cataract surgery.   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 Authorizing Provider  acetaminophen (TYLENOL) 325 MG tablet Take 650 mg by mouth every 6 (six) hours as needed.   Yes [provider]  albuterol (VENTOLIN HFA) 108 (90 Base) MCG/ACT inhaler Inhale 2 puffs into the lungs every 6 (six) hours as needed for wheezing or shortness of breath.   Yes [provider]  apixaban (ELIQUIS) 2.5 MG TABS tablet Take 2.5 mg by mouth 2 (two) times daily.   Yes [provider]  azithromycin (ZITHROMAX) 250 MG tablet Take by mouth. Take 1 tab by po 3 times a week   Yes [provider]  budesonide-formoterol (SYMBICORT) 160-4.5 MCG/ACT inhaler Inhale 2 puffs into the lungs 2 (two) times daily.  06/05/18 04/25/22 Yes [provider]  calcium acetate, Phos Binder, (PHOSLYRA) 667 MG/5ML SOLN Take 1,334 mg by mouth 3 (three) times daily with meals.   Yes [provider]  Cholecalciferol (VITAMIN D3) 125 MCG (5000 UT) TBDP Take by mouth.   Yes [provider]  ferrous sulfate 324 MG TBEC Take 324 mg by mouth daily. With lunch   Yes [provider]  finasteride (PROSCAR) 5 MG tablet Take 5 mg by mouth daily.   Yes [provider]  FLUoxetine (PROZAC) 10 MG capsule Take 10 mg by mouth daily. 07/06/21  Yes [provider]  insulin lispro (HUMALOG) 100 UNIT/ML KwikPen Inject into the skin. 08/22/21 08/22/22 Yes [provider]  Insulin Syringe-Needle U-100 (BD VEO INSULIN SYRINGE U/F) 31G X 15/64" 0.3 ML MISC Use 1 Syringe 4 (four) times daily 12/16/19  Yes [provider]  ketoconazole (NIZORAL) 2 % cream Apply 1 Application topically 2 (two) times daily. foot   Yes [provider]  Magnesium 200 MG TABS Take by mouth. Take 1 tab po daily   Yes [provider]  mometasone-formoterol (DULERA) 200-5 MCG/ACT AERO Inhale into the lungs. 07/09/21  Yes [provider]  Multiple Vitamin (MULTI-VITAMIN) tablet Take 1 tablet by mouth daily. 08/19/19  Yes [provider]  mycophenolate (CELLCEPT) 250 MG capsule Take by mouth 2 (two) times daily.   Yes [provider]  OXYGEN Inhale 2 L into the lungs. At night and with exertion   Yes [provider]  pantoprazole (PROTONIX) 40 MG tablet Take 40 mg by mouth 2 (two) times daily.    Yes [provider]  polyethylene glycol (MIRALAX / GLYCOLAX) 17 g packet Take 17 g by mouth daily.   Yes [provider]  predniSONE (DELTASONE) 5 MG tablet Take 5 mg by mouth daily with breakfast.   Yes [provider]   propranolol (INDERAL) 20 MG tablet Take 20 mg by mouth 2 (two) times daily.   Yes [provider]  rosuvastatin (CRESTOR) 10 MG tablet Take 10 mg by mouth daily.   Yes [provider]  senna-docusate (SENOKOT-S) 8.6-50 MG tablet Take 1 tablet by mouth 2 (two) times daily.   Yes [provider]  sulfamethoxazole-trimethoprim (BACTRIM) 400-80 MG tablet Take 1 tablet by mouth 3 (three) times a week. Mon, Wed, Fri   Yes [provider]  tacrolimus (PROGRAF) 0.5 MG capsule Take in combination with '1mg'$  capsules for total dose of '1mg'$  by mouth daily and 1.'5mg'$  by mouth nightly. 02/04/20  Yes [provider]  tacrolimus (PROGRAF) 1 MG capsule Take 1 mg by mouth 2 (two) times daily.  02/01/18  Yes [provider]  tamsulosin (FLOMAX) 0.4 MG CAPS Take 0.4 mg by mouth daily after breakfast. 1 on saturdays   Yes [provider]  torsemide (DEMADEX) 20 MG tablet Take '60mg'$  by mouth twice daily x 3 days (9/3-9/5), then decrease to '40mg'$  by mouth twice daily.  Take additional '40mg'$  by mouth if weight gain increases by 2 lbs in 1 day or 5 lbs in 1 week. 01/31/20  Yes [provider]  valGANciclovir (VALCYTE) 450 MG tablet Take 450 mg by mouth every other day. Take every Monday and thursday   Yes [provider]  glucagon 1 MG injection For emergency use 02/28/20   [provider]    Allergies as of 03/25/2022 - Review Complete 11/07/2021  Allergen Reaction Noted   Nsaids Other (See Comments) 07/29/2014    Family History  Problem Relation Age of Onset   Coronary artery disease Other    Diabetes Other     Social History   Socioeconomic History   Marital status: Married    Spouse name: Not on file   Number of children: Not on file   Years of education: Not on file   Highest education level: Not on file  Occupational History   Not on file  Tobacco Use   Smoking status: Former    Types: Cigarettes    Quit date: 05/16/1979     Years since quitting: 42.9   Smokeless tobacco: Never   Tobacco comments:    Quit 1980  Vaping Use   Vaping Use: Never used  Substance and Sexual Activity   Alcohol use: Not Currently   Drug use: No   Sexual activity: Not on file  Other Topics Concern   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    Social Determinants of Health   Financial Resource Strain: Not on file  Food Insecurity: Not on file  Transportation Needs: Not on file  Physical Activity: Not on file  Stress: Not on file  Social Connections: Not on file  Intimate Partner Violence: Not on file  Review of Systems: See HPI, otherwise negative ROS  Physical Exam: BP (!) 178/88   Pulse 87   Temp 97.6 F (36.4 C) (Temporal)   Ht '5\' 9"'$  (1.753 m)   Wt 95.3 kg   SpO2 98%   BMI 31.01 kg/m  General:   Alert, cooperative in NAD Head:  Normocephalic and atraumatic. Respiratory:  Normal work of breathing. Cardiovascular:  RRR  Impression/Plan: AHMEER TUMAN is here for cataract surgery.  Risks, benefits, limitations, and alternatives regarding cataract surgery have been reviewed with the patient.  Questions have been answered.  All parties agreeable.   Benay Pillow, MD  04/25/2022, 7:14 AM

## 2022-04-25 NOTE — Op Note (Signed)
OPERATIVE NOTE  Caleb [name] [id number] 04/25/2022   PREOPERATIVE DIAGNOSIS:  Nuclear sclerotic cataract right eye.  H25.11   POSTOPERATIVE DIAGNOSIS:      Nuclear sclerotic cataract right eye.   Intraoperative floppy iris syndrome.   PROCEDURE:  CPT [phone] Complex Phacoemusification with posterior chamber intraocular lens placement of the right eye, requiring use of a malyugin ring for pupillary dilation and visualization  LENS:   Implant Name Type Inv. Item Serial No. Manufacturer Lot No. LRB No. Used Action  LENS IOL TECNIS EYHANCE 23.0 - [id number] Intraocular Lens LENS IOL TECNIS EYHANCE 23.0 [id number] [employer]  Right 1 Implanted       Procedure(s) with comments: CATARACT EXTRACTION PHACO AND INTRAOCULAR LENS PLACEMENT (IOC) RIGHT DIABETIC (Right) - 3.98 0:36.8    SURGEON:  Benay Pillow, MD, MPH  ANESTHESIOLOGIST: Anesthesiologist: Molli Barrows, MD CRNA: Jacqualin Combes, CRNA   ANESTHESIA:  Topical with tetracaine drops augmented with 1% preservative-free intracameral lidocaine.  ESTIMATED BLOOD LOSS: less than 1 mL.   COMPLICATIONS:  None.   DESCRIPTION OF PROCEDURE:  The patient was identified in the holding room and transported to the operating room and placed in the supine position under the operating microscope.  The right eye was identified as the operative eye and it was prepped and draped in the usual sterile ophthalmic fashion.   A 1.0 millimeter clear-corneal paracentesis was made at the 10:30 position. 0.5 ml of preservative-free 1% lidocaine with epinephrine was injected into the anterior chamber.  The anterior chamber was filled with viscoelastic.  A 2.4 millimeter keratome was used to make a near-clear corneal incision at the 8:00 position.    A malyugin ring was placed.  A curvilinear capsulorrhexis was made with a cystotome and capsulorrhexis forceps.  Balanced salt solution was used to hydrodissect and hydrodelineate the nucleus.    Phacoemulsification was then used in stop and chop fashion to remove the lens nucleus and epinucleus.  The remaining cortex was then removed using the irrigation and aspiration handpiece. Viscoelastic was then placed into the capsular bag to distend it for lens placement.  A lens was then injected into the capsular bag.    The ring was removed. The remaining viscoelastic was aspirated.   Wounds were hydrated with balanced salt solution.  The anterior chamber was inflated to a physiologic pressure with balanced salt solution.   Intracameral vigamox 0.1 mL undiluted was injected into the eye and a drop placed onto the ocular surface.  No wound leaks were noted.  The patient was taken to the recovery room in stable condition without complications of anesthesia or surgery  Benay Pillow 04/25/2022, 7:57 AM

## 2022-04-25 NOTE — Transfer of Care (Signed)
Immediate Anesthesia Transfer of Care Note  Patient: Caleb [name]  Procedure(s) Performed: CATARACT EXTRACTION PHACO AND INTRAOCULAR LENS PLACEMENT (IOC) RIGHT DIABETIC (Right: Eye)  Patient Location: PACU  Anesthesia Type: MAC  Level of Consciousness: awake, alert  and patient cooperative  Airway and Oxygen Therapy: Patient Spontanous Breathing and Patient connected to supplemental oxygen  Post-op Assessment: Post-op Vital signs reviewed, Patient's Cardiovascular Status Stable, Respiratory Function Stable, Patent Airway and No signs of Nausea or vomiting  Post-op Vital Signs: Reviewed and stable  Complications: No notable events documented.

## 2022-04-25 NOTE — Anesthesia Postprocedure Evaluation (Signed)
Anesthesia Post Note  Patient: Caleb [name]  Procedure(s) Performed: CATARACT EXTRACTION PHACO AND INTRAOCULAR LENS PLACEMENT (IOC) RIGHT DIABETIC (Right: Eye)  Patient location during evaluation: PACU Anesthesia Type: MAC Level of consciousness: awake and alert Pain management: pain level controlled Vital Signs Assessment: post-procedure vital signs reviewed and stable Respiratory status: spontaneous breathing, nonlabored ventilation, respiratory function stable and patient connected to nasal cannula oxygen Cardiovascular status: stable and blood pressure returned to baseline Postop Assessment: no apparent nausea or vomiting Anesthetic complications: no   No notable events documented.   Last Vitals:  Vitals:   04/25/22 0800 04/25/22 0805  BP: 137/89 (!) 149/77  Pulse: 73 74  Resp: 12 (!) 24  Temp:    SpO2: 100% 100%    Last Pain:  Vitals:   04/25/22 0805  TempSrc:   PainSc: 0-No pain                 Molli Barrows

## 2022-04-25 NOTE — Telephone Encounter (Signed)
[facility] [place] physical therapy called for verbal order for physical therapy once a week for 7 weeks lmom on secure chat [phone] gave verbal for PT once a week for 7 weeks

## 2022-04-25 NOTE — Anesthesia Preprocedure Evaluation (Signed)
Anesthesia Evaluation  Patient identified by MRN, date of birth, ID band Patient awake    Reviewed: Allergy & Precautions, H&P , NPO status , Patient's Chart, lab work & pertinent test results, reviewed documented beta blocker date and time   Airway Mallampati: II  TM Distance: >3 FB Neck ROM: full    Dental no notable dental hx. (+) Teeth Intact   Pulmonary shortness of breath and with exertion, sleep apnea and Continuous Positive Airway Pressure Ventilation , pneumonia, resolved, COPD, former smoker   Pulmonary exam normal breath sounds clear to auscultation       Cardiovascular Exercise Tolerance: Poor hypertension, On Medications + Cardiac Defibrillator  Rhythm:regular Rate:Normal     Neuro/Psych negative neurological ROS  negative psych ROS   GI/Hepatic negative GI ROS, Neg liver ROS,,,  Endo/Other  negative endocrine ROSdiabetes    Renal/GU Renal disease     Musculoskeletal   Abdominal   Peds  Hematology negative hematology ROS (+)   Anesthesia Other Findings   Reproductive/Obstetrics negative OB ROS                             Anesthesia Physical Anesthesia Plan  ASA: 3  Anesthesia Plan: MAC   Post-op Pain Management:    Induction:   PONV Risk Score and Plan:   Airway Management Planned:   Additional Equipment:   Intra-op Plan:   Post-operative Plan:   Informed Consent: I have reviewed the patients History and Physical, chart, labs and discussed the procedure including the risks, benefits and alternatives for the proposed anesthesia with the patient or authorized representative who has indicated his/her understanding and acceptance.       Plan Discussed with: CRNA  Anesthesia Plan Comments:        Anesthesia Quick Evaluation

## 2022-04-26 ENCOUNTER — Encounter: Payer: Self-pay | Admitting: Ophthalmology

## 2022-04-27 DIAGNOSIS — I130 Hypertensive heart and chronic kidney disease with heart failure and stage 1 through stage 4 chronic kidney disease, or unspecified chronic kidney disease: Secondary | ICD-10-CM | POA: Diagnosis not present

## 2022-04-27 DIAGNOSIS — I5022 Chronic systolic (congestive) heart failure: Secondary | ICD-10-CM | POA: Diagnosis not present

## 2022-04-27 DIAGNOSIS — Z466 Encounter for fitting and adjustment of urinary device: Secondary | ICD-10-CM | POA: Diagnosis not present

## 2022-04-27 DIAGNOSIS — R338 Other retention of urine: Secondary | ICD-10-CM | POA: Diagnosis not present

## 2022-04-27 DIAGNOSIS — E1122 Type 2 diabetes mellitus with diabetic chronic kidney disease: Secondary | ICD-10-CM | POA: Diagnosis not present

## 2022-04-27 DIAGNOSIS — N401 Enlarged prostate with lower urinary tract symptoms: Secondary | ICD-10-CM | POA: Diagnosis not present

## 2022-04-28 DIAGNOSIS — Z006 Encounter for examination for normal comparison and control in clinical research program: Secondary | ICD-10-CM | POA: Diagnosis not present

## 2022-04-28 DIAGNOSIS — T86810 Lung transplant rejection: Secondary | ICD-10-CM | POA: Diagnosis not present

## 2022-04-28 DIAGNOSIS — J42 Unspecified chronic bronchitis: Secondary | ICD-10-CM | POA: Diagnosis not present

## 2022-04-28 DIAGNOSIS — J4481 Bronchiolitis obliterans and bronchiolitis obliterans syndrome: Secondary | ICD-10-CM | POA: Diagnosis not present

## 2022-04-29 DIAGNOSIS — Z006 Encounter for examination for normal comparison and control in clinical research program: Secondary | ICD-10-CM | POA: Diagnosis not present

## 2022-04-29 DIAGNOSIS — T86810 Lung transplant rejection: Secondary | ICD-10-CM | POA: Diagnosis not present

## 2022-04-29 DIAGNOSIS — J4481 Bronchiolitis obliterans and bronchiolitis obliterans syndrome: Secondary | ICD-10-CM | POA: Diagnosis not present

## 2022-04-29 DIAGNOSIS — J9621 Acute and chronic respiratory failure with hypoxia: Secondary | ICD-10-CM | POA: Diagnosis not present

## 2022-04-29 DIAGNOSIS — J42 Unspecified chronic bronchitis: Secondary | ICD-10-CM | POA: Diagnosis not present

## 2022-04-29 DIAGNOSIS — J849 Interstitial pulmonary disease, unspecified: Secondary | ICD-10-CM | POA: Diagnosis not present

## 2022-05-05 DIAGNOSIS — N401 Enlarged prostate with lower urinary tract symptoms: Secondary | ICD-10-CM | POA: Diagnosis not present

## 2022-05-05 DIAGNOSIS — I5022 Chronic systolic (congestive) heart failure: Secondary | ICD-10-CM | POA: Diagnosis not present

## 2022-05-05 DIAGNOSIS — R338 Other retention of urine: Secondary | ICD-10-CM | POA: Diagnosis not present

## 2022-05-05 DIAGNOSIS — Z466 Encounter for fitting and adjustment of urinary device: Secondary | ICD-10-CM | POA: Diagnosis not present

## 2022-05-05 DIAGNOSIS — I130 Hypertensive heart and chronic kidney disease with heart failure and stage 1 through stage 4 chronic kidney disease, or unspecified chronic kidney disease: Secondary | ICD-10-CM | POA: Diagnosis not present

## 2022-05-05 DIAGNOSIS — E1122 Type 2 diabetes mellitus with diabetic chronic kidney disease: Secondary | ICD-10-CM | POA: Diagnosis not present

## 2022-05-09 DIAGNOSIS — C44329 Squamous cell carcinoma of skin of other parts of face: Secondary | ICD-10-CM | POA: Diagnosis not present

## 2022-05-10 DIAGNOSIS — R338 Other retention of urine: Secondary | ICD-10-CM | POA: Diagnosis not present

## 2022-05-10 DIAGNOSIS — Z466 Encounter for fitting and adjustment of urinary device: Secondary | ICD-10-CM | POA: Diagnosis not present

## 2022-05-10 DIAGNOSIS — I130 Hypertensive heart and chronic kidney disease with heart failure and stage 1 through stage 4 chronic kidney disease, or unspecified chronic kidney disease: Secondary | ICD-10-CM | POA: Diagnosis not present

## 2022-05-10 DIAGNOSIS — E1122 Type 2 diabetes mellitus with diabetic chronic kidney disease: Secondary | ICD-10-CM | POA: Diagnosis not present

## 2022-05-10 DIAGNOSIS — N401 Enlarged prostate with lower urinary tract symptoms: Secondary | ICD-10-CM | POA: Diagnosis not present

## 2022-05-10 DIAGNOSIS — I5022 Chronic systolic (congestive) heart failure: Secondary | ICD-10-CM | POA: Diagnosis not present

## 2022-05-13 DIAGNOSIS — I130 Hypertensive heart and chronic kidney disease with heart failure and stage 1 through stage 4 chronic kidney disease, or unspecified chronic kidney disease: Secondary | ICD-10-CM | POA: Diagnosis not present

## 2022-05-13 DIAGNOSIS — N401 Enlarged prostate with lower urinary tract symptoms: Secondary | ICD-10-CM | POA: Diagnosis not present

## 2022-05-13 DIAGNOSIS — Z45018 Encounter for adjustment and management of other part of cardiac pacemaker: Secondary | ICD-10-CM | POA: Diagnosis not present

## 2022-05-13 DIAGNOSIS — R338 Other retention of urine: Secondary | ICD-10-CM | POA: Diagnosis not present

## 2022-05-13 DIAGNOSIS — E1122 Type 2 diabetes mellitus with diabetic chronic kidney disease: Secondary | ICD-10-CM | POA: Diagnosis not present

## 2022-05-13 DIAGNOSIS — I5022 Chronic systolic (congestive) heart failure: Secondary | ICD-10-CM | POA: Diagnosis not present

## 2022-05-13 DIAGNOSIS — Z466 Encounter for fitting and adjustment of urinary device: Secondary | ICD-10-CM | POA: Diagnosis not present

## 2022-05-15 DIAGNOSIS — I5022 Chronic systolic (congestive) heart failure: Secondary | ICD-10-CM | POA: Diagnosis not present

## 2022-05-15 DIAGNOSIS — Z87891 Personal history of nicotine dependence: Secondary | ICD-10-CM | POA: Diagnosis not present

## 2022-05-15 DIAGNOSIS — E785 Hyperlipidemia, unspecified: Secondary | ICD-10-CM | POA: Diagnosis not present

## 2022-05-15 DIAGNOSIS — E1122 Type 2 diabetes mellitus with diabetic chronic kidney disease: Secondary | ICD-10-CM | POA: Diagnosis not present

## 2022-05-15 DIAGNOSIS — J449 Chronic obstructive pulmonary disease, unspecified: Secondary | ICD-10-CM | POA: Diagnosis not present

## 2022-05-15 DIAGNOSIS — J8410 Pulmonary fibrosis, unspecified: Secondary | ICD-10-CM | POA: Diagnosis not present

## 2022-05-15 DIAGNOSIS — Z7952 Long term (current) use of systemic steroids: Secondary | ICD-10-CM | POA: Diagnosis not present

## 2022-05-15 DIAGNOSIS — E661 Drug-induced obesity: Secondary | ICD-10-CM | POA: Diagnosis not present

## 2022-05-15 DIAGNOSIS — K5909 Other constipation: Secondary | ICD-10-CM | POA: Diagnosis not present

## 2022-05-15 DIAGNOSIS — N184 Chronic kidney disease, stage 4 (severe): Secondary | ICD-10-CM | POA: Diagnosis not present

## 2022-05-15 DIAGNOSIS — Z95810 Presence of automatic (implantable) cardiac defibrillator: Secondary | ICD-10-CM | POA: Diagnosis not present

## 2022-05-15 DIAGNOSIS — Z7901 Long term (current) use of anticoagulants: Secondary | ICD-10-CM | POA: Diagnosis not present

## 2022-05-15 DIAGNOSIS — Z6832 Body mass index (BMI) 32.0-32.9, adult: Secondary | ICD-10-CM | POA: Diagnosis not present

## 2022-05-15 DIAGNOSIS — Z466 Encounter for fitting and adjustment of urinary device: Secondary | ICD-10-CM | POA: Diagnosis not present

## 2022-05-15 DIAGNOSIS — I130 Hypertensive heart and chronic kidney disease with heart failure and stage 1 through stage 4 chronic kidney disease, or unspecified chronic kidney disease: Secondary | ICD-10-CM | POA: Diagnosis not present

## 2022-05-15 DIAGNOSIS — Z85828 Personal history of other malignant neoplasm of skin: Secondary | ICD-10-CM | POA: Diagnosis not present

## 2022-05-15 DIAGNOSIS — Z942 Lung transplant status: Secondary | ICD-10-CM | POA: Diagnosis not present

## 2022-05-15 DIAGNOSIS — D509 Iron deficiency anemia, unspecified: Secondary | ICD-10-CM | POA: Diagnosis not present

## 2022-05-15 DIAGNOSIS — R338 Other retention of urine: Secondary | ICD-10-CM | POA: Diagnosis not present

## 2022-05-15 DIAGNOSIS — Z794 Long term (current) use of insulin: Secondary | ICD-10-CM | POA: Diagnosis not present

## 2022-05-15 DIAGNOSIS — S0180XD Unspecified open wound of other part of head, subsequent encounter: Secondary | ICD-10-CM | POA: Diagnosis not present

## 2022-05-15 DIAGNOSIS — N401 Enlarged prostate with lower urinary tract symptoms: Secondary | ICD-10-CM | POA: Diagnosis not present

## 2022-05-15 DIAGNOSIS — Z9981 Dependence on supplemental oxygen: Secondary | ICD-10-CM | POA: Diagnosis not present

## 2022-05-15 DIAGNOSIS — K219 Gastro-esophageal reflux disease without esophagitis: Secondary | ICD-10-CM | POA: Diagnosis not present

## 2022-05-15 DIAGNOSIS — G4733 Obstructive sleep apnea (adult) (pediatric): Secondary | ICD-10-CM | POA: Diagnosis not present

## 2022-05-18 DIAGNOSIS — H524 Presbyopia: Secondary | ICD-10-CM | POA: Diagnosis not present

## 2022-05-19 DIAGNOSIS — J9621 Acute and chronic respiratory failure with hypoxia: Secondary | ICD-10-CM | POA: Diagnosis not present

## 2022-05-19 DIAGNOSIS — J42 Unspecified chronic bronchitis: Secondary | ICD-10-CM | POA: Diagnosis not present

## 2022-05-19 DIAGNOSIS — T86810 Lung transplant rejection: Secondary | ICD-10-CM | POA: Diagnosis not present

## 2022-05-19 DIAGNOSIS — J849 Interstitial pulmonary disease, unspecified: Secondary | ICD-10-CM | POA: Diagnosis not present

## 2022-05-19 DIAGNOSIS — J4481 Bronchiolitis obliterans and bronchiolitis obliterans syndrome: Secondary | ICD-10-CM | POA: Diagnosis not present

## 2022-05-19 DIAGNOSIS — Z006 Encounter for examination for normal comparison and control in clinical research program: Secondary | ICD-10-CM | POA: Diagnosis not present

## 2022-05-20 DIAGNOSIS — T86810 Lung transplant rejection: Secondary | ICD-10-CM | POA: Diagnosis not present

## 2022-05-20 DIAGNOSIS — J42 Unspecified chronic bronchitis: Secondary | ICD-10-CM | POA: Diagnosis not present

## 2022-05-20 DIAGNOSIS — Z006 Encounter for examination for normal comparison and control in clinical research program: Secondary | ICD-10-CM | POA: Diagnosis not present

## 2022-05-20 DIAGNOSIS — J4481 Bronchiolitis obliterans and bronchiolitis obliterans syndrome: Secondary | ICD-10-CM | POA: Diagnosis not present

## 2022-05-20 DIAGNOSIS — J9621 Acute and chronic respiratory failure with hypoxia: Secondary | ICD-10-CM | POA: Diagnosis not present

## 2022-05-24 DIAGNOSIS — Z466 Encounter for fitting and adjustment of urinary device: Secondary | ICD-10-CM | POA: Diagnosis not present

## 2022-05-24 DIAGNOSIS — N401 Enlarged prostate with lower urinary tract symptoms: Secondary | ICD-10-CM | POA: Diagnosis not present

## 2022-05-24 DIAGNOSIS — R338 Other retention of urine: Secondary | ICD-10-CM | POA: Diagnosis not present

## 2022-05-24 DIAGNOSIS — E1122 Type 2 diabetes mellitus with diabetic chronic kidney disease: Secondary | ICD-10-CM | POA: Diagnosis not present

## 2022-05-24 DIAGNOSIS — I5022 Chronic systolic (congestive) heart failure: Secondary | ICD-10-CM | POA: Diagnosis not present

## 2022-05-24 DIAGNOSIS — I130 Hypertensive heart and chronic kidney disease with heart failure and stage 1 through stage 4 chronic kidney disease, or unspecified chronic kidney disease: Secondary | ICD-10-CM | POA: Diagnosis not present

## 2022-06-02 DIAGNOSIS — Z466 Encounter for fitting and adjustment of urinary device: Secondary | ICD-10-CM | POA: Diagnosis not present

## 2022-06-02 DIAGNOSIS — I5022 Chronic systolic (congestive) heart failure: Secondary | ICD-10-CM | POA: Diagnosis not present

## 2022-06-02 DIAGNOSIS — R338 Other retention of urine: Secondary | ICD-10-CM | POA: Diagnosis not present

## 2022-06-02 DIAGNOSIS — E1122 Type 2 diabetes mellitus with diabetic chronic kidney disease: Secondary | ICD-10-CM | POA: Diagnosis not present

## 2022-06-02 DIAGNOSIS — N401 Enlarged prostate with lower urinary tract symptoms: Secondary | ICD-10-CM | POA: Diagnosis not present

## 2022-06-02 DIAGNOSIS — I130 Hypertensive heart and chronic kidney disease with heart failure and stage 1 through stage 4 chronic kidney disease, or unspecified chronic kidney disease: Secondary | ICD-10-CM | POA: Diagnosis not present

## 2022-06-09 DIAGNOSIS — E1122 Type 2 diabetes mellitus with diabetic chronic kidney disease: Secondary | ICD-10-CM | POA: Diagnosis not present

## 2022-06-09 DIAGNOSIS — R338 Other retention of urine: Secondary | ICD-10-CM | POA: Diagnosis not present

## 2022-06-09 DIAGNOSIS — Z466 Encounter for fitting and adjustment of urinary device: Secondary | ICD-10-CM | POA: Diagnosis not present

## 2022-06-09 DIAGNOSIS — N401 Enlarged prostate with lower urinary tract symptoms: Secondary | ICD-10-CM | POA: Diagnosis not present

## 2022-06-09 DIAGNOSIS — I5022 Chronic systolic (congestive) heart failure: Secondary | ICD-10-CM | POA: Diagnosis not present

## 2022-06-09 DIAGNOSIS — I130 Hypertensive heart and chronic kidney disease with heart failure and stage 1 through stage 4 chronic kidney disease, or unspecified chronic kidney disease: Secondary | ICD-10-CM | POA: Diagnosis not present

## 2022-06-10 DIAGNOSIS — Z45018 Encounter for adjustment and management of other part of cardiac pacemaker: Secondary | ICD-10-CM | POA: Diagnosis not present

## 2022-06-14 DIAGNOSIS — Z466 Encounter for fitting and adjustment of urinary device: Secondary | ICD-10-CM | POA: Diagnosis not present

## 2022-06-14 DIAGNOSIS — Z95810 Presence of automatic (implantable) cardiac defibrillator: Secondary | ICD-10-CM | POA: Diagnosis not present

## 2022-06-14 DIAGNOSIS — Z7952 Long term (current) use of systemic steroids: Secondary | ICD-10-CM | POA: Diagnosis not present

## 2022-06-14 DIAGNOSIS — Z7901 Long term (current) use of anticoagulants: Secondary | ICD-10-CM | POA: Diagnosis not present

## 2022-06-14 DIAGNOSIS — J449 Chronic obstructive pulmonary disease, unspecified: Secondary | ICD-10-CM | POA: Diagnosis not present

## 2022-06-14 DIAGNOSIS — Z85828 Personal history of other malignant neoplasm of skin: Secondary | ICD-10-CM | POA: Diagnosis not present

## 2022-06-14 DIAGNOSIS — Z6832 Body mass index (BMI) 32.0-32.9, adult: Secondary | ICD-10-CM | POA: Diagnosis not present

## 2022-06-14 DIAGNOSIS — Z8616 Personal history of COVID-19: Secondary | ICD-10-CM | POA: Diagnosis not present

## 2022-06-14 DIAGNOSIS — Z87891 Personal history of nicotine dependence: Secondary | ICD-10-CM | POA: Diagnosis not present

## 2022-06-14 DIAGNOSIS — Z942 Lung transplant status: Secondary | ICD-10-CM | POA: Diagnosis not present

## 2022-06-14 DIAGNOSIS — K5909 Other constipation: Secondary | ICD-10-CM | POA: Diagnosis not present

## 2022-06-14 DIAGNOSIS — D509 Iron deficiency anemia, unspecified: Secondary | ICD-10-CM | POA: Diagnosis not present

## 2022-06-14 DIAGNOSIS — E1122 Type 2 diabetes mellitus with diabetic chronic kidney disease: Secondary | ICD-10-CM | POA: Diagnosis not present

## 2022-06-14 DIAGNOSIS — Z9981 Dependence on supplemental oxygen: Secondary | ICD-10-CM | POA: Diagnosis not present

## 2022-06-14 DIAGNOSIS — Z8701 Personal history of pneumonia (recurrent): Secondary | ICD-10-CM | POA: Diagnosis not present

## 2022-06-14 DIAGNOSIS — I130 Hypertensive heart and chronic kidney disease with heart failure and stage 1 through stage 4 chronic kidney disease, or unspecified chronic kidney disease: Secondary | ICD-10-CM | POA: Diagnosis not present

## 2022-06-14 DIAGNOSIS — N401 Enlarged prostate with lower urinary tract symptoms: Secondary | ICD-10-CM | POA: Diagnosis not present

## 2022-06-14 DIAGNOSIS — I5022 Chronic systolic (congestive) heart failure: Secondary | ICD-10-CM | POA: Diagnosis not present

## 2022-06-14 DIAGNOSIS — N184 Chronic kidney disease, stage 4 (severe): Secondary | ICD-10-CM | POA: Diagnosis not present

## 2022-06-14 DIAGNOSIS — G4733 Obstructive sleep apnea (adult) (pediatric): Secondary | ICD-10-CM | POA: Diagnosis not present

## 2022-06-14 DIAGNOSIS — E661 Drug-induced obesity: Secondary | ICD-10-CM | POA: Diagnosis not present

## 2022-06-14 DIAGNOSIS — R338 Other retention of urine: Secondary | ICD-10-CM | POA: Diagnosis not present

## 2022-06-14 DIAGNOSIS — K219 Gastro-esophageal reflux disease without esophagitis: Secondary | ICD-10-CM | POA: Diagnosis not present

## 2022-06-14 DIAGNOSIS — E785 Hyperlipidemia, unspecified: Secondary | ICD-10-CM | POA: Diagnosis not present

## 2022-06-14 DIAGNOSIS — Z794 Long term (current) use of insulin: Secondary | ICD-10-CM | POA: Diagnosis not present

## 2022-06-16 DIAGNOSIS — J4481 Bronchiolitis obliterans and bronchiolitis obliterans syndrome: Secondary | ICD-10-CM | POA: Diagnosis not present

## 2022-06-16 DIAGNOSIS — J849 Interstitial pulmonary disease, unspecified: Secondary | ICD-10-CM | POA: Diagnosis not present

## 2022-06-16 DIAGNOSIS — Z006 Encounter for examination for normal comparison and control in clinical research program: Secondary | ICD-10-CM | POA: Diagnosis not present

## 2022-06-16 DIAGNOSIS — J9621 Acute and chronic respiratory failure with hypoxia: Secondary | ICD-10-CM | POA: Diagnosis not present

## 2022-06-16 DIAGNOSIS — T86810 Lung transplant rejection: Secondary | ICD-10-CM | POA: Diagnosis not present

## 2022-06-16 DIAGNOSIS — R790 Abnormal level of blood mineral: Secondary | ICD-10-CM | POA: Diagnosis not present

## 2022-06-16 DIAGNOSIS — J42 Unspecified chronic bronchitis: Secondary | ICD-10-CM | POA: Diagnosis not present

## 2022-06-17 DIAGNOSIS — T86810 Lung transplant rejection: Secondary | ICD-10-CM | POA: Diagnosis not present

## 2022-06-17 DIAGNOSIS — J4481 Bronchiolitis obliterans and bronchiolitis obliterans syndrome: Secondary | ICD-10-CM | POA: Diagnosis not present

## 2022-06-17 DIAGNOSIS — J42 Unspecified chronic bronchitis: Secondary | ICD-10-CM | POA: Diagnosis not present

## 2022-06-17 DIAGNOSIS — Z006 Encounter for examination for normal comparison and control in clinical research program: Secondary | ICD-10-CM | POA: Diagnosis not present

## 2022-06-23 DIAGNOSIS — N401 Enlarged prostate with lower urinary tract symptoms: Secondary | ICD-10-CM | POA: Diagnosis not present

## 2022-06-23 DIAGNOSIS — R338 Other retention of urine: Secondary | ICD-10-CM | POA: Diagnosis not present

## 2022-06-23 DIAGNOSIS — J449 Chronic obstructive pulmonary disease, unspecified: Secondary | ICD-10-CM | POA: Diagnosis not present

## 2022-06-23 DIAGNOSIS — Z466 Encounter for fitting and adjustment of urinary device: Secondary | ICD-10-CM | POA: Diagnosis not present

## 2022-06-23 DIAGNOSIS — I5022 Chronic systolic (congestive) heart failure: Secondary | ICD-10-CM | POA: Diagnosis not present

## 2022-06-23 DIAGNOSIS — I130 Hypertensive heart and chronic kidney disease with heart failure and stage 1 through stage 4 chronic kidney disease, or unspecified chronic kidney disease: Secondary | ICD-10-CM | POA: Diagnosis not present

## 2022-06-29 DIAGNOSIS — N184 Chronic kidney disease, stage 4 (severe): Secondary | ICD-10-CM | POA: Diagnosis not present

## 2022-06-29 DIAGNOSIS — E871 Hypo-osmolality and hyponatremia: Secondary | ICD-10-CM | POA: Diagnosis not present

## 2022-06-29 DIAGNOSIS — E875 Hyperkalemia: Secondary | ICD-10-CM | POA: Diagnosis not present

## 2022-06-29 DIAGNOSIS — I10 Essential (primary) hypertension: Secondary | ICD-10-CM | POA: Diagnosis not present

## 2022-07-08 DIAGNOSIS — I10 Essential (primary) hypertension: Secondary | ICD-10-CM | POA: Diagnosis not present

## 2022-07-08 DIAGNOSIS — N184 Chronic kidney disease, stage 4 (severe): Secondary | ICD-10-CM | POA: Diagnosis not present

## 2022-07-08 DIAGNOSIS — I130 Hypertensive heart and chronic kidney disease with heart failure and stage 1 through stage 4 chronic kidney disease, or unspecified chronic kidney disease: Secondary | ICD-10-CM | POA: Diagnosis not present

## 2022-07-08 DIAGNOSIS — J4481 Bronchiolitis obliterans and bronchiolitis obliterans syndrome: Secondary | ICD-10-CM | POA: Diagnosis not present

## 2022-07-08 DIAGNOSIS — D849 Immunodeficiency, unspecified: Secondary | ICD-10-CM | POA: Diagnosis not present

## 2022-07-08 DIAGNOSIS — J449 Chronic obstructive pulmonary disease, unspecified: Secondary | ICD-10-CM | POA: Diagnosis not present

## 2022-07-08 DIAGNOSIS — Z942 Lung transplant status: Secondary | ICD-10-CM | POA: Diagnosis not present

## 2022-07-08 DIAGNOSIS — J4A9 Chronic lung allograft dysfunction, unspecified: Secondary | ICD-10-CM | POA: Diagnosis not present

## 2022-07-08 DIAGNOSIS — Z7901 Long term (current) use of anticoagulants: Secondary | ICD-10-CM | POA: Diagnosis not present

## 2022-07-08 DIAGNOSIS — R338 Other retention of urine: Secondary | ICD-10-CM | POA: Diagnosis not present

## 2022-07-08 DIAGNOSIS — N401 Enlarged prostate with lower urinary tract symptoms: Secondary | ICD-10-CM | POA: Diagnosis not present

## 2022-07-08 DIAGNOSIS — Z5181 Encounter for therapeutic drug level monitoring: Secondary | ICD-10-CM | POA: Diagnosis not present

## 2022-07-08 DIAGNOSIS — I480 Paroxysmal atrial fibrillation: Secondary | ICD-10-CM | POA: Diagnosis not present

## 2022-07-08 DIAGNOSIS — J8410 Pulmonary fibrosis, unspecified: Secondary | ICD-10-CM | POA: Diagnosis not present

## 2022-07-08 DIAGNOSIS — T86818 Other complications of lung transplant: Secondary | ICD-10-CM | POA: Diagnosis not present

## 2022-07-08 DIAGNOSIS — I5022 Chronic systolic (congestive) heart failure: Secondary | ICD-10-CM | POA: Diagnosis not present

## 2022-07-08 DIAGNOSIS — Z466 Encounter for fitting and adjustment of urinary device: Secondary | ICD-10-CM | POA: Diagnosis not present

## 2022-07-08 DIAGNOSIS — Z79899 Other long term (current) drug therapy: Secondary | ICD-10-CM | POA: Diagnosis not present

## 2022-07-14 DIAGNOSIS — Z95810 Presence of automatic (implantable) cardiac defibrillator: Secondary | ICD-10-CM | POA: Diagnosis not present

## 2022-07-14 DIAGNOSIS — J449 Chronic obstructive pulmonary disease, unspecified: Secondary | ICD-10-CM | POA: Diagnosis not present

## 2022-07-14 DIAGNOSIS — E785 Hyperlipidemia, unspecified: Secondary | ICD-10-CM | POA: Diagnosis not present

## 2022-07-14 DIAGNOSIS — T86810 Lung transplant rejection: Secondary | ICD-10-CM | POA: Diagnosis not present

## 2022-07-14 DIAGNOSIS — J9621 Acute and chronic respiratory failure with hypoxia: Secondary | ICD-10-CM | POA: Diagnosis not present

## 2022-07-14 DIAGNOSIS — Z006 Encounter for examination for normal comparison and control in clinical research program: Secondary | ICD-10-CM | POA: Diagnosis not present

## 2022-07-14 DIAGNOSIS — G4733 Obstructive sleep apnea (adult) (pediatric): Secondary | ICD-10-CM | POA: Diagnosis not present

## 2022-07-14 DIAGNOSIS — Z87891 Personal history of nicotine dependence: Secondary | ICD-10-CM | POA: Diagnosis not present

## 2022-07-14 DIAGNOSIS — I130 Hypertensive heart and chronic kidney disease with heart failure and stage 1 through stage 4 chronic kidney disease, or unspecified chronic kidney disease: Secondary | ICD-10-CM | POA: Diagnosis not present

## 2022-07-14 DIAGNOSIS — Z6832 Body mass index (BMI) 32.0-32.9, adult: Secondary | ICD-10-CM | POA: Diagnosis not present

## 2022-07-14 DIAGNOSIS — J410 Simple chronic bronchitis: Secondary | ICD-10-CM | POA: Diagnosis not present

## 2022-07-14 DIAGNOSIS — E661 Drug-induced obesity: Secondary | ICD-10-CM | POA: Diagnosis not present

## 2022-07-14 DIAGNOSIS — R338 Other retention of urine: Secondary | ICD-10-CM | POA: Diagnosis not present

## 2022-07-14 DIAGNOSIS — Z8616 Personal history of COVID-19: Secondary | ICD-10-CM | POA: Diagnosis not present

## 2022-07-14 DIAGNOSIS — Z8701 Personal history of pneumonia (recurrent): Secondary | ICD-10-CM | POA: Diagnosis not present

## 2022-07-14 DIAGNOSIS — E1122 Type 2 diabetes mellitus with diabetic chronic kidney disease: Secondary | ICD-10-CM | POA: Diagnosis not present

## 2022-07-14 DIAGNOSIS — N184 Chronic kidney disease, stage 4 (severe): Secondary | ICD-10-CM | POA: Diagnosis not present

## 2022-07-14 DIAGNOSIS — J42 Unspecified chronic bronchitis: Secondary | ICD-10-CM | POA: Diagnosis not present

## 2022-07-14 DIAGNOSIS — Z466 Encounter for fitting and adjustment of urinary device: Secondary | ICD-10-CM | POA: Diagnosis not present

## 2022-07-14 DIAGNOSIS — Z85828 Personal history of other malignant neoplasm of skin: Secondary | ICD-10-CM | POA: Diagnosis not present

## 2022-07-14 DIAGNOSIS — K5909 Other constipation: Secondary | ICD-10-CM | POA: Diagnosis not present

## 2022-07-14 DIAGNOSIS — Z7901 Long term (current) use of anticoagulants: Secondary | ICD-10-CM | POA: Diagnosis not present

## 2022-07-14 DIAGNOSIS — J4481 Bronchiolitis obliterans and bronchiolitis obliterans syndrome: Secondary | ICD-10-CM | POA: Diagnosis not present

## 2022-07-14 DIAGNOSIS — K219 Gastro-esophageal reflux disease without esophagitis: Secondary | ICD-10-CM | POA: Diagnosis not present

## 2022-07-14 DIAGNOSIS — Z7952 Long term (current) use of systemic steroids: Secondary | ICD-10-CM | POA: Diagnosis not present

## 2022-07-14 DIAGNOSIS — I5022 Chronic systolic (congestive) heart failure: Secondary | ICD-10-CM | POA: Diagnosis not present

## 2022-07-14 DIAGNOSIS — Z794 Long term (current) use of insulin: Secondary | ICD-10-CM | POA: Diagnosis not present

## 2022-07-14 DIAGNOSIS — Z942 Lung transplant status: Secondary | ICD-10-CM | POA: Diagnosis not present

## 2022-07-14 DIAGNOSIS — Z9981 Dependence on supplemental oxygen: Secondary | ICD-10-CM | POA: Diagnosis not present

## 2022-07-14 DIAGNOSIS — N401 Enlarged prostate with lower urinary tract symptoms: Secondary | ICD-10-CM | POA: Diagnosis not present

## 2022-07-14 DIAGNOSIS — D509 Iron deficiency anemia, unspecified: Secondary | ICD-10-CM | POA: Diagnosis not present

## 2022-07-15 DIAGNOSIS — J42 Unspecified chronic bronchitis: Secondary | ICD-10-CM | POA: Diagnosis not present

## 2022-07-15 DIAGNOSIS — J4481 Bronchiolitis obliterans and bronchiolitis obliterans syndrome: Secondary | ICD-10-CM | POA: Diagnosis not present

## 2022-07-15 DIAGNOSIS — T86810 Lung transplant rejection: Secondary | ICD-10-CM | POA: Diagnosis not present

## 2022-07-15 DIAGNOSIS — Z006 Encounter for examination for normal comparison and control in clinical research program: Secondary | ICD-10-CM | POA: Diagnosis not present

## 2022-07-15 DIAGNOSIS — J849 Interstitial pulmonary disease, unspecified: Secondary | ICD-10-CM | POA: Diagnosis not present

## 2022-07-20 DIAGNOSIS — E1169 Type 2 diabetes mellitus with other specified complication: Secondary | ICD-10-CM | POA: Diagnosis not present

## 2022-07-20 DIAGNOSIS — E1165 Type 2 diabetes mellitus with hyperglycemia: Secondary | ICD-10-CM | POA: Diagnosis not present

## 2022-07-20 DIAGNOSIS — I152 Hypertension secondary to endocrine disorders: Secondary | ICD-10-CM | POA: Diagnosis not present

## 2022-07-20 DIAGNOSIS — E1159 Type 2 diabetes mellitus with other circulatory complications: Secondary | ICD-10-CM | POA: Diagnosis not present

## 2022-07-20 DIAGNOSIS — Z794 Long term (current) use of insulin: Secondary | ICD-10-CM | POA: Diagnosis not present

## 2022-07-20 DIAGNOSIS — E785 Hyperlipidemia, unspecified: Secondary | ICD-10-CM | POA: Diagnosis not present

## 2022-08-04 DIAGNOSIS — I130 Hypertensive heart and chronic kidney disease with heart failure and stage 1 through stage 4 chronic kidney disease, or unspecified chronic kidney disease: Secondary | ICD-10-CM | POA: Diagnosis not present

## 2022-08-04 DIAGNOSIS — J449 Chronic obstructive pulmonary disease, unspecified: Secondary | ICD-10-CM | POA: Diagnosis not present

## 2022-08-04 DIAGNOSIS — R338 Other retention of urine: Secondary | ICD-10-CM | POA: Diagnosis not present

## 2022-08-04 DIAGNOSIS — Z466 Encounter for fitting and adjustment of urinary device: Secondary | ICD-10-CM | POA: Diagnosis not present

## 2022-08-04 DIAGNOSIS — N401 Enlarged prostate with lower urinary tract symptoms: Secondary | ICD-10-CM | POA: Diagnosis not present

## 2022-08-04 DIAGNOSIS — I5022 Chronic systolic (congestive) heart failure: Secondary | ICD-10-CM | POA: Diagnosis not present

## 2022-08-05 ENCOUNTER — Telehealth: Payer: Self-pay | Admitting: Internal Medicine

## 2022-08-05 ENCOUNTER — Telehealth: Payer: Self-pay | Admitting: Nurse Practitioner

## 2022-08-05 NOTE — Telephone Encounter (Signed)
Received [facility] home health order form. Gave to AA for signature-nm

## 2022-08-05 NOTE — Telephone Encounter (Signed)
Received [facility] order form for AA signature. [facility] order form signed. Faxed back; [phone]. To be scanned-nm

## 2022-08-11 DIAGNOSIS — T86810 Lung transplant rejection: Secondary | ICD-10-CM | POA: Diagnosis not present

## 2022-08-11 DIAGNOSIS — Z006 Encounter for examination for normal comparison and control in clinical research program: Secondary | ICD-10-CM | POA: Diagnosis not present

## 2022-08-11 DIAGNOSIS — J849 Interstitial pulmonary disease, unspecified: Secondary | ICD-10-CM | POA: Diagnosis not present

## 2022-08-11 DIAGNOSIS — J42 Unspecified chronic bronchitis: Secondary | ICD-10-CM | POA: Diagnosis not present

## 2022-08-11 DIAGNOSIS — J4481 Bronchiolitis obliterans and bronchiolitis obliterans syndrome: Secondary | ICD-10-CM | POA: Diagnosis not present

## 2022-08-11 DIAGNOSIS — T8610 Unspecified complication of kidney transplant: Secondary | ICD-10-CM | POA: Diagnosis not present

## 2022-08-12 DIAGNOSIS — Z466 Encounter for fitting and adjustment of urinary device: Secondary | ICD-10-CM | POA: Diagnosis not present

## 2022-08-12 DIAGNOSIS — J449 Chronic obstructive pulmonary disease, unspecified: Secondary | ICD-10-CM | POA: Diagnosis not present

## 2022-08-12 DIAGNOSIS — T86810 Lung transplant rejection: Secondary | ICD-10-CM | POA: Diagnosis not present

## 2022-08-12 DIAGNOSIS — I130 Hypertensive heart and chronic kidney disease with heart failure and stage 1 through stage 4 chronic kidney disease, or unspecified chronic kidney disease: Secondary | ICD-10-CM | POA: Diagnosis not present

## 2022-08-12 DIAGNOSIS — I5022 Chronic systolic (congestive) heart failure: Secondary | ICD-10-CM | POA: Diagnosis not present

## 2022-08-12 DIAGNOSIS — J849 Interstitial pulmonary disease, unspecified: Secondary | ICD-10-CM | POA: Diagnosis not present

## 2022-08-12 DIAGNOSIS — N401 Enlarged prostate with lower urinary tract symptoms: Secondary | ICD-10-CM | POA: Diagnosis not present

## 2022-08-12 DIAGNOSIS — J9621 Acute and chronic respiratory failure with hypoxia: Secondary | ICD-10-CM | POA: Diagnosis not present

## 2022-08-12 DIAGNOSIS — J4481 Bronchiolitis obliterans and bronchiolitis obliterans syndrome: Secondary | ICD-10-CM | POA: Diagnosis not present

## 2022-08-12 DIAGNOSIS — Z006 Encounter for examination for normal comparison and control in clinical research program: Secondary | ICD-10-CM | POA: Diagnosis not present

## 2022-08-12 DIAGNOSIS — R338 Other retention of urine: Secondary | ICD-10-CM | POA: Diagnosis not present

## 2022-08-23 ENCOUNTER — Telehealth: Payer: Self-pay | Admitting: Internal Medicine

## 2022-08-23 NOTE — Telephone Encounter (Signed)
Received [facility] order form for AA signature. [facility] order form signed by [facility]. Faxed back; [phone]. To be scanned-nm

## 2022-09-01 DIAGNOSIS — D0471 Carcinoma in situ of skin of right lower limb, including hip: Secondary | ICD-10-CM | POA: Diagnosis not present

## 2022-09-01 DIAGNOSIS — D225 Melanocytic nevi of trunk: Secondary | ICD-10-CM | POA: Diagnosis not present

## 2022-09-01 DIAGNOSIS — L570 Actinic keratosis: Secondary | ICD-10-CM | POA: Diagnosis not present

## 2022-09-01 DIAGNOSIS — D492 Neoplasm of unspecified behavior of bone, soft tissue, and skin: Secondary | ICD-10-CM | POA: Diagnosis not present

## 2022-09-01 DIAGNOSIS — Z85820 Personal history of malignant melanoma of skin: Secondary | ICD-10-CM | POA: Diagnosis not present

## 2022-09-01 DIAGNOSIS — D2260 Melanocytic nevi of unspecified upper limb, including shoulder: Secondary | ICD-10-CM | POA: Diagnosis not present

## 2022-09-01 DIAGNOSIS — D849 Immunodeficiency, unspecified: Secondary | ICD-10-CM | POA: Diagnosis not present

## 2022-09-01 DIAGNOSIS — C44329 Squamous cell carcinoma of skin of other parts of face: Secondary | ICD-10-CM | POA: Diagnosis not present

## 2022-09-01 DIAGNOSIS — L304 Erythema intertrigo: Secondary | ICD-10-CM | POA: Diagnosis not present

## 2022-09-01 DIAGNOSIS — L814 Other melanin hyperpigmentation: Secondary | ICD-10-CM | POA: Diagnosis not present

## 2022-09-01 DIAGNOSIS — D2270 Melanocytic nevi of unspecified lower limb, including hip: Secondary | ICD-10-CM | POA: Diagnosis not present

## 2022-09-01 DIAGNOSIS — L821 Other seborrheic keratosis: Secondary | ICD-10-CM | POA: Diagnosis not present

## 2022-09-01 DIAGNOSIS — L578 Other skin changes due to chronic exposure to nonionizing radiation: Secondary | ICD-10-CM | POA: Diagnosis not present

## 2022-09-01 DIAGNOSIS — Z85828 Personal history of other malignant neoplasm of skin: Secondary | ICD-10-CM | POA: Diagnosis not present

## 2022-09-01 DIAGNOSIS — L738 Other specified follicular disorders: Secondary | ICD-10-CM | POA: Diagnosis not present

## 2022-09-01 DIAGNOSIS — D485 Neoplasm of uncertain behavior of skin: Secondary | ICD-10-CM | POA: Diagnosis not present

## 2022-09-08 DIAGNOSIS — J42 Unspecified chronic bronchitis: Secondary | ICD-10-CM | POA: Diagnosis not present

## 2022-09-08 DIAGNOSIS — Z006 Encounter for examination for normal comparison and control in clinical research program: Secondary | ICD-10-CM | POA: Diagnosis not present

## 2022-09-08 DIAGNOSIS — T86810 Lung transplant rejection: Secondary | ICD-10-CM | POA: Diagnosis not present

## 2022-09-08 DIAGNOSIS — J4481 Bronchiolitis obliterans and bronchiolitis obliterans syndrome: Secondary | ICD-10-CM | POA: Diagnosis not present

## 2022-09-09 DIAGNOSIS — T86810 Lung transplant rejection: Secondary | ICD-10-CM | POA: Diagnosis not present

## 2022-09-09 DIAGNOSIS — Z006 Encounter for examination for normal comparison and control in clinical research program: Secondary | ICD-10-CM | POA: Diagnosis not present

## 2022-09-09 DIAGNOSIS — J42 Unspecified chronic bronchitis: Secondary | ICD-10-CM | POA: Diagnosis not present

## 2022-09-09 DIAGNOSIS — J4481 Bronchiolitis obliterans and bronchiolitis obliterans syndrome: Secondary | ICD-10-CM | POA: Diagnosis not present

## 2022-09-12 DIAGNOSIS — R338 Other retention of urine: Secondary | ICD-10-CM | POA: Diagnosis not present

## 2022-09-12 DIAGNOSIS — N401 Enlarged prostate with lower urinary tract symptoms: Secondary | ICD-10-CM | POA: Diagnosis not present

## 2022-09-12 DIAGNOSIS — Z7901 Long term (current) use of anticoagulants: Secondary | ICD-10-CM | POA: Diagnosis not present

## 2022-09-12 DIAGNOSIS — K5909 Other constipation: Secondary | ICD-10-CM | POA: Diagnosis not present

## 2022-09-12 DIAGNOSIS — K219 Gastro-esophageal reflux disease without esophagitis: Secondary | ICD-10-CM | POA: Diagnosis not present

## 2022-09-12 DIAGNOSIS — Z87891 Personal history of nicotine dependence: Secondary | ICD-10-CM | POA: Diagnosis not present

## 2022-09-12 DIAGNOSIS — Z6832 Body mass index (BMI) 32.0-32.9, adult: Secondary | ICD-10-CM | POA: Diagnosis not present

## 2022-09-12 DIAGNOSIS — Z9981 Dependence on supplemental oxygen: Secondary | ICD-10-CM | POA: Diagnosis not present

## 2022-09-12 DIAGNOSIS — N184 Chronic kidney disease, stage 4 (severe): Secondary | ICD-10-CM | POA: Diagnosis not present

## 2022-09-12 DIAGNOSIS — Z942 Lung transplant status: Secondary | ICD-10-CM | POA: Diagnosis not present

## 2022-09-12 DIAGNOSIS — Z8616 Personal history of COVID-19: Secondary | ICD-10-CM | POA: Diagnosis not present

## 2022-09-12 DIAGNOSIS — E661 Drug-induced obesity: Secondary | ICD-10-CM | POA: Diagnosis not present

## 2022-09-12 DIAGNOSIS — Z85828 Personal history of other malignant neoplasm of skin: Secondary | ICD-10-CM | POA: Diagnosis not present

## 2022-09-12 DIAGNOSIS — Z794 Long term (current) use of insulin: Secondary | ICD-10-CM | POA: Diagnosis not present

## 2022-09-12 DIAGNOSIS — G4733 Obstructive sleep apnea (adult) (pediatric): Secondary | ICD-10-CM | POA: Diagnosis not present

## 2022-09-12 DIAGNOSIS — D509 Iron deficiency anemia, unspecified: Secondary | ICD-10-CM | POA: Diagnosis not present

## 2022-09-12 DIAGNOSIS — Z466 Encounter for fitting and adjustment of urinary device: Secondary | ICD-10-CM | POA: Diagnosis not present

## 2022-09-12 DIAGNOSIS — Z7952 Long term (current) use of systemic steroids: Secondary | ICD-10-CM | POA: Diagnosis not present

## 2022-09-12 DIAGNOSIS — J449 Chronic obstructive pulmonary disease, unspecified: Secondary | ICD-10-CM | POA: Diagnosis not present

## 2022-09-12 DIAGNOSIS — I130 Hypertensive heart and chronic kidney disease with heart failure and stage 1 through stage 4 chronic kidney disease, or unspecified chronic kidney disease: Secondary | ICD-10-CM | POA: Diagnosis not present

## 2022-09-12 DIAGNOSIS — E785 Hyperlipidemia, unspecified: Secondary | ICD-10-CM | POA: Diagnosis not present

## 2022-09-12 DIAGNOSIS — E1122 Type 2 diabetes mellitus with diabetic chronic kidney disease: Secondary | ICD-10-CM | POA: Diagnosis not present

## 2022-09-12 DIAGNOSIS — I5022 Chronic systolic (congestive) heart failure: Secondary | ICD-10-CM | POA: Diagnosis not present

## 2022-09-12 DIAGNOSIS — Z95810 Presence of automatic (implantable) cardiac defibrillator: Secondary | ICD-10-CM | POA: Diagnosis not present

## 2022-09-12 DIAGNOSIS — Z79621 Long term (current) use of calcineurin inhibitor: Secondary | ICD-10-CM | POA: Diagnosis not present

## 2022-09-14 ENCOUNTER — Telehealth: Payer: Self-pay | Admitting: Internal Medicine

## 2022-09-14 ENCOUNTER — Telehealth: Payer: Self-pay | Admitting: Nurse Practitioner

## 2022-09-14 NOTE — Telephone Encounter (Signed)
Received [facility] order form 08/12/2022, Gave to AA to signature.-nm

## 2022-09-15 DIAGNOSIS — I130 Hypertensive heart and chronic kidney disease with heart failure and stage 1 through stage 4 chronic kidney disease, or unspecified chronic kidney disease: Secondary | ICD-10-CM | POA: Diagnosis not present

## 2022-09-15 DIAGNOSIS — R338 Other retention of urine: Secondary | ICD-10-CM | POA: Diagnosis not present

## 2022-09-15 DIAGNOSIS — N401 Enlarged prostate with lower urinary tract symptoms: Secondary | ICD-10-CM | POA: Diagnosis not present

## 2022-09-15 DIAGNOSIS — I5022 Chronic systolic (congestive) heart failure: Secondary | ICD-10-CM | POA: Diagnosis not present

## 2022-09-15 DIAGNOSIS — Z466 Encounter for fitting and adjustment of urinary device: Secondary | ICD-10-CM | POA: Diagnosis not present

## 2022-09-15 DIAGNOSIS — J449 Chronic obstructive pulmonary disease, unspecified: Secondary | ICD-10-CM | POA: Diagnosis not present

## 2022-09-16 NOTE — Telephone Encounter (Signed)
Error

## 2022-09-19 ENCOUNTER — Telehealth: Payer: Self-pay | Admitting: Internal Medicine

## 2022-09-19 NOTE — Telephone Encounter (Signed)
[facility] order signed. Faxed back; [phone]. To be scanned

## 2022-09-29 DIAGNOSIS — I5022 Chronic systolic (congestive) heart failure: Secondary | ICD-10-CM | POA: Diagnosis not present

## 2022-09-29 DIAGNOSIS — Z466 Encounter for fitting and adjustment of urinary device: Secondary | ICD-10-CM | POA: Diagnosis not present

## 2022-09-29 DIAGNOSIS — R338 Other retention of urine: Secondary | ICD-10-CM | POA: Diagnosis not present

## 2022-09-29 DIAGNOSIS — I130 Hypertensive heart and chronic kidney disease with heart failure and stage 1 through stage 4 chronic kidney disease, or unspecified chronic kidney disease: Secondary | ICD-10-CM | POA: Diagnosis not present

## 2022-09-29 DIAGNOSIS — N401 Enlarged prostate with lower urinary tract symptoms: Secondary | ICD-10-CM | POA: Diagnosis not present

## 2022-09-29 DIAGNOSIS — J449 Chronic obstructive pulmonary disease, unspecified: Secondary | ICD-10-CM | POA: Diagnosis not present

## 2022-10-03 ENCOUNTER — Ambulatory Visit (INDEPENDENT_AMBULATORY_CARE_PROVIDER_SITE_OTHER): Payer: Medicare Other | Admitting: Nurse Practitioner

## 2022-10-03 ENCOUNTER — Encounter: Payer: Self-pay | Admitting: Nurse Practitioner

## 2022-10-03 VITALS — BP 138/80 | HR 76 | Temp 96.9°F | Resp 16 | Ht 69.0 in | Wt 212.4 lb

## 2022-10-03 DIAGNOSIS — J441 Chronic obstructive pulmonary disease with (acute) exacerbation: Secondary | ICD-10-CM | POA: Diagnosis not present

## 2022-10-03 DIAGNOSIS — E1165 Type 2 diabetes mellitus with hyperglycemia: Secondary | ICD-10-CM | POA: Diagnosis not present

## 2022-10-03 DIAGNOSIS — I10 Essential (primary) hypertension: Secondary | ICD-10-CM

## 2022-10-03 DIAGNOSIS — T86811 Lung transplant failure: Secondary | ICD-10-CM

## 2022-10-03 MED ORDER — PREDNISONE 10 MG PO TABS
ORAL_TABLET | ORAL | 0 refills | Status: DC
Start: 2022-10-03 — End: 2023-04-10

## 2022-10-03 MED ORDER — AMOXICILLIN-POT CLAVULANATE 875-125 MG PO TABS
1.0000 | ORAL_TABLET | Freq: Two times a day (BID) | ORAL | 0 refills | Status: AC
Start: 2022-10-03 — End: 2022-10-13

## 2022-10-03 NOTE — Progress Notes (Signed)
[facility] [address] [place], [place] [address]  Internal MEDICINE  Office Visit Note  Patient Name: Caleb [name]  [id number]  [id number]  Date of Service: 10/03/2022  Chief Complaint  Patient presents with   Diabetes   Hyperlipidemia   Follow-up    HPI Caleb [name] presents for a follow-up visit for COPD, hypertension.  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 Increased SOB and congestion from baseline with some wheezing  Hypertension -- controlled with current medications  Diabetes -- sees [facility] end[facility]ology, Dr. Gershon Crane.       Current Medication: Outpatient Encounter Medications as of 10/03/2022  Medication Sig Note   acetaminophen (TYLENOL) 325 MG tablet Take 650 mg by mouth every 6 (six) hours as needed.    albuterol (VENTOLIN HFA) 108 (90 Base) MCG/ACT inhaler Inhale 2 puffs into the lungs every 6 (six) hours as needed for wheezing or shortness of breath.    amoxicillin-clavulanate (AUGMENTIN) 875-125 MG tablet Take 1 tablet by mouth 2 (two) times daily for 10 days.    apixaban (ELIQUIS) 2.5 MG TABS tablet Take 2.5 mg by mouth 2 (two) times daily.    calcium acetate, Phos Binder, (PHOSLYRA) 667 MG/5ML SOLN Take 1,334 mg by mouth 3 (three) times daily with meals.    Cholecalciferol (VITAMIN D3) 125 MCG (5000 UT) TBDP Take by mouth.    ferrous sulfate 324 MG TBEC Take 324 mg by mouth daily. With lunch    finasteride (PROSCAR) 5 MG tablet Take 5 mg by mouth daily.    FLUoxetine (PROZAC) 10 MG capsule Take 10 mg by mouth daily.    glucagon 1 MG injection For emergency use    Insulin Syringe-Needle U-100 (BD VEO INSULIN SYRINGE U/F) 31G X 15/64" 0.3 ML MISC Use 1 Syringe 4 (four) times daily    ketoconazole (NIZORAL) 2 % cream Apply 1 Application topically 2 (two) times daily. foot    Magnesium 200 MG TABS Take by mouth. Take 1 tab po daily    mometasone-formoterol (DULERA) 200-5 MCG/ACT AERO Inhale into the  lungs.    Multiple Vitamin (MULTI-VITAMIN) tablet Take 1 tablet by mouth daily.    mycophenolate (CELLCEPT) 250 MG capsule Take by mouth 2 (two) times daily.    OXYGEN Inhale 2 L into the lungs. At night and with exertion    pantoprazole (PROTONIX) 40 MG tablet Take 40 mg by mouth 2 (two) times daily.     polyethylene glycol (MIRALAX / GLYCOLAX) 17 g packet Take 17 g by mouth daily.    predniSONE (DELTASONE) 10 MG tablet Take 4 tablets by mouth on day 1, then 3 tablets by mouth on day 2, then 2 tablets by mouth on day 3 and 1 tablet by mouth on day 4, then stop.    predniSONE (DELTASONE) 5 MG tablet Take 5 mg by mouth daily with breakfast.    propranolol (INDERAL) 20 MG tablet Take 20 mg by mouth 2 (two) times daily.    rosuvastatin (CRESTOR) 10 MG tablet Take 10 mg by mouth daily.    senna-docusate (SENOKOT-S) 8.6-50 MG tablet Take 1 tablet by mouth 2 (two) times daily.    sulfamethoxazole-trimethoprim (BACTRIM) 400-80 MG tablet Take 1 tablet by mouth 3 (three) times a week. Mon, Wed, Fri    tacrolimus (PROGRAF) 0.5 MG capsule Take in combination with 1mg  capsules for total dose of 1mg  by mouth daily and 1.5mg  by mouth nightly.    tacrolimus (PROGRAF)  1 MG capsule Take 1 mg by mouth 2 (two) times daily.  01/14/2020: Takes 1mg  PO BID   tamsulosin (FLOMAX) 0.4 MG CAPS Take 0.4 mg by mouth daily after breakfast. 1 on saturdays    torsemide (DEMADEX) 20 MG tablet Take 60mg  by mouth twice daily x 3 days (9/3-9/5), then decrease to 40mg  by mouth twice daily.  Take additional 40mg  by mouth if weight gain increases by 2 lbs in 1 day or 5 lbs in 1 week.    valGANciclovir (VALCYTE) 450 MG tablet Take 450 mg by mouth every other day. Take every Monday and thursday 01/13/2020: Due today, takes in the evening every other day   [DISCONTINUED] azithromycin (ZITHROMAX) 250 MG tablet Take by mouth. Take 1 tab by po 3 times a week    budesonide-formoterol (SYMBICORT) 160-4.5 MCG/ACT inhaler Inhale 2 puffs into the  lungs 2 (two) times daily.     insulin lispro (HUMALOG) 100 UNIT/ML KwikPen Inject into the skin.    No facility-administered encounter medications on file as of 10/03/2022.    Surgical History: Past Surgical History:  Procedure Laterality Date   BRONCHOSCOPY     multiple   CARDIAC DEFIBRILLATOR PLACEMENT  10/18/2019   [facility]   CATARACT EXTRACTION W/PHACO Left 04/11/2022   Procedure: CATARACT EXTRACTION PHACO AND INTRAOCULAR LENS PLACEMENT (IOC) LEFT DIABETIC;  Surgeon: Nevada Crane, MD;  Location: [facility];  Service: Ophthalmology;  Laterality: Left;  4.20 0:46.5   CATARACT EXTRACTION W/PHACO Right 04/25/2022   Procedure: CATARACT EXTRACTION PHACO AND INTRAOCULAR LENS PLACEMENT (IOC) RIGHT DIABETIC;  Surgeon: Nevada Crane, MD;  Location: [facility];  Service: Ophthalmology;  Laterality: Right;  3.98 0:36.8   HIP FRACTURE SURGERY Right 12/2019   [facility]   LUNG TRANSPLANT      Medical History: 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   Atrial fibrillation (HCC)    BPH (benign prostatic hyperplasia)    Chest pain, unspecified    Chronic heart failure with preserved ejection fraction (HCC)    Chronic rejection of allograft lung (HCC)    CKD (chronic kidney disease), stage IV (HCC)    COPD (chronic obstructive pulmonary disease) (HCC)    Diabetes mellitus without complication (HCC)    Dyspnea    Hyperlipidemia    Hypertension    Interstitial lung disease (HCC)    Lung transplant recipient [facility]) 2016   [facility] - Right   Sleep apnea     Family History: Family History  Problem Relation Age of Onset   Coronary artery disease Other    Diabetes Other     Social History   Socioeconomic History   Marital status: Married    Spouse name: Not on file   Number of children: Not on file   Years of education: Not on file   Highest education level: Not on file  Occupational History   Not on  file  Tobacco Use   Smoking status: Former    Types: Cigarettes    Quit date: 05/16/1979    Years since quitting: 43.4   Smokeless tobacco: Never   Tobacco comments:    Quit 1980  Vaping Use   Vaping Use: Never used  Substance and Sexual Activity   Alcohol use: Not Currently   Drug use: No   Sexual activity: Not on file  Other Topics Concern   Not on file  Social History Narrative   Does not regularly exercise. Full time truck  driver.    Social Determinants of Health   Financial Resource Strain: Not on file  Food Insecurity: Not on file  Transportation Needs: Not on file  Physical Activity: Not on file  Stress: Not on file  Social Connections: Not on file  Intimate Partner Violence: Not on file      Review of Systems  Constitutional:  Positive for activity change and fatigue. Negative for chills and fever.  HENT: Negative.    Respiratory:  Positive for cough, chest tightness, shortness of breath and wheezing.   Cardiovascular: Negative.  Negative for chest pain and palpitations.  Gastrointestinal: Negative.   Musculoskeletal:  Positive for arthralgias.    Vital Signs: BP 138/80 Comment: 150/90  Pulse 76   Temp (!) 96.9 F (36.1 C)   Resp 16   Ht 5\' 9"  (1.753 m)   Wt 212 lb 6.4 oz (96.3 kg)   SpO2 95% Comment: 3L  BMI 31.37 kg/m    Physical Exam Constitutional:      General: He is not in acute distress.    Appearance: Normal appearance. He is obese. He is ill-appearing.  HENT:     Head: Normocephalic and atraumatic.     Nose: Congestion present.  Eyes:     Pupils: Pupils are equal, round, and reactive to light.  Cardiovascular:     Rate and Rhythm: Normal rate and regular rhythm.     Heart sounds: Normal heart sounds. No murmur heard. Pulmonary:     Effort: Accessory muscle usage present. No respiratory distress.     Breath sounds: Examination of the right-upper field reveals wheezing. Examination of the left-upper field reveals wheezing.  Examination of the right-middle field reveals wheezing and rales. Examination of the left-middle field reveals wheezing and rales. Examination of the right-lower field reveals rales. Examination of the left-lower field reveals rales. Wheezing and rales present.  Neurological:     Mental Status: He is alert and oriented to person, place, and time.  Psychiatric:        Mood and Affect: Mood normal.        Behavior: Behavior normal.        Assessment/Plan: 1. COPD with acute exacerbation (HCC) Augmentin and a short prednisone taper prescribed. Call the clinic if no improvement.  - amoxicillin-clavulanate (AUGMENTIN) 875-125 MG tablet; Take 1 tablet by mouth 2 (two) times daily for 10 days.  Dispense: 20 tablet; Refill: 0 - predniSONE (DELTASONE) 10 MG tablet; Take 4 tablets by mouth on day 1, then 3 tablets by mouth on day 2, then 2 tablets by mouth on day 3 and 1 tablet by mouth on day 4, then stop.  Dispense: 10 tablet; Refill: 0  2. Lung transplant failure (HCC) No significant change in status, continue to monitor.   3. Essential hypertension Stable, continue current medications as prescribed   4. Uncontrolled type 2 diabetes mellitus with hyperglycemia (HCC) Continue to follow up with Dr. Gershon Crane, [facility].    General Counseling: ardan asta understanding of the findings of todays visit and agrees with plan of treatment. I have discussed any further diagnostic evaluation that may be needed or ordered today. We also reviewed his medications today. he has been encouraged to call the office with any questions or concerns that should arise related to todays visit.    No orders of the defined types were placed in this encounter.   Meds ordered this encounter  Medications   amoxicillin-clavulanate (AUGMENTIN) 875-125 MG tablet    Sig: Take  1 tablet by mouth 2 (two) times daily for 10 days.    Dispense:  20 tablet    Refill:  0   predniSONE (DELTASONE) 10 MG  tablet    Sig: Take 4 tablets by mouth on day 1, then 3 tablets by mouth on day 2, then 2 tablets by mouth on day 3 and 1 tablet by mouth on day 4, then stop.    Dispense:  10 tablet    Refill:  0    Return for previously scheduled, CPE, [facility] PCP in november. .   Total time spent:30 Minutes Time spent includes review of chart, medications, test results, and follow up plan with the patient.   [place] Controlled Substance Database was reviewed by me.  This patient was seen by Sallyanne Kuster, FNP-C in collaboration with Dr. Beverely Risen as a part of collaborative care agreement.   Amareon Phung R. Tedd Sias, MSN, FNP-C Internal medicine

## 2022-10-06 DIAGNOSIS — T86810 Lung transplant rejection: Secondary | ICD-10-CM | POA: Diagnosis not present

## 2022-10-06 DIAGNOSIS — Z006 Encounter for examination for normal comparison and control in clinical research program: Secondary | ICD-10-CM | POA: Diagnosis not present

## 2022-10-06 DIAGNOSIS — J4481 Bronchiolitis obliterans and bronchiolitis obliterans syndrome: Secondary | ICD-10-CM | POA: Diagnosis not present

## 2022-10-06 DIAGNOSIS — J42 Unspecified chronic bronchitis: Secondary | ICD-10-CM | POA: Diagnosis not present

## 2022-10-06 DIAGNOSIS — Z79899 Other long term (current) drug therapy: Secondary | ICD-10-CM | POA: Diagnosis not present

## 2022-10-07 ENCOUNTER — Telehealth: Payer: Self-pay

## 2022-10-07 DIAGNOSIS — Z006 Encounter for examination for normal comparison and control in clinical research program: Secondary | ICD-10-CM | POA: Diagnosis not present

## 2022-10-07 DIAGNOSIS — J849 Interstitial pulmonary disease, unspecified: Secondary | ICD-10-CM | POA: Diagnosis not present

## 2022-10-07 DIAGNOSIS — J42 Unspecified chronic bronchitis: Secondary | ICD-10-CM | POA: Diagnosis not present

## 2022-10-07 DIAGNOSIS — T86810 Lung transplant rejection: Secondary | ICD-10-CM | POA: Diagnosis not present

## 2022-10-07 DIAGNOSIS — J9621 Acute and chronic respiratory failure with hypoxia: Secondary | ICD-10-CM | POA: Diagnosis not present

## 2022-10-07 DIAGNOSIS — J4481 Bronchiolitis obliterans and bronchiolitis obliterans syndrome: Secondary | ICD-10-CM | POA: Diagnosis not present

## 2022-10-07 NOTE — Telephone Encounter (Signed)
Annabelle Harman from [facility] [phone]) called wanting to let us know that they will have to reschedule his research for nursing to next week as a staff member called out and they didn't have another nurse to go to his home.

## 2022-10-10 DIAGNOSIS — Z466 Encounter for fitting and adjustment of urinary device: Secondary | ICD-10-CM | POA: Diagnosis not present

## 2022-10-10 DIAGNOSIS — N401 Enlarged prostate with lower urinary tract symptoms: Secondary | ICD-10-CM | POA: Diagnosis not present

## 2022-10-10 DIAGNOSIS — I5022 Chronic systolic (congestive) heart failure: Secondary | ICD-10-CM | POA: Diagnosis not present

## 2022-10-10 DIAGNOSIS — R338 Other retention of urine: Secondary | ICD-10-CM | POA: Diagnosis not present

## 2022-10-10 DIAGNOSIS — J449 Chronic obstructive pulmonary disease, unspecified: Secondary | ICD-10-CM | POA: Diagnosis not present

## 2022-10-10 DIAGNOSIS — I130 Hypertensive heart and chronic kidney disease with heart failure and stage 1 through stage 4 chronic kidney disease, or unspecified chronic kidney disease: Secondary | ICD-10-CM | POA: Diagnosis not present

## 2022-10-11 ENCOUNTER — Telehealth: Payer: Self-pay | Admitting: Nurse Practitioner

## 2022-10-11 NOTE — Telephone Encounter (Signed)
Spoke with brittany and advised her call his urology as per [employer]

## 2022-10-12 DIAGNOSIS — E785 Hyperlipidemia, unspecified: Secondary | ICD-10-CM | POA: Diagnosis not present

## 2022-10-12 DIAGNOSIS — J449 Chronic obstructive pulmonary disease, unspecified: Secondary | ICD-10-CM | POA: Diagnosis not present

## 2022-10-12 DIAGNOSIS — Z6832 Body mass index (BMI) 32.0-32.9, adult: Secondary | ICD-10-CM | POA: Diagnosis not present

## 2022-10-12 DIAGNOSIS — Z466 Encounter for fitting and adjustment of urinary device: Secondary | ICD-10-CM | POA: Diagnosis not present

## 2022-10-12 DIAGNOSIS — N184 Chronic kidney disease, stage 4 (severe): Secondary | ICD-10-CM | POA: Diagnosis not present

## 2022-10-12 DIAGNOSIS — Z942 Lung transplant status: Secondary | ICD-10-CM | POA: Diagnosis not present

## 2022-10-12 DIAGNOSIS — K5909 Other constipation: Secondary | ICD-10-CM | POA: Diagnosis not present

## 2022-10-12 DIAGNOSIS — Z95810 Presence of automatic (implantable) cardiac defibrillator: Secondary | ICD-10-CM | POA: Diagnosis not present

## 2022-10-12 DIAGNOSIS — Z794 Long term (current) use of insulin: Secondary | ICD-10-CM | POA: Diagnosis not present

## 2022-10-12 DIAGNOSIS — D509 Iron deficiency anemia, unspecified: Secondary | ICD-10-CM | POA: Diagnosis not present

## 2022-10-12 DIAGNOSIS — I5022 Chronic systolic (congestive) heart failure: Secondary | ICD-10-CM | POA: Diagnosis not present

## 2022-10-12 DIAGNOSIS — Z7901 Long term (current) use of anticoagulants: Secondary | ICD-10-CM | POA: Diagnosis not present

## 2022-10-12 DIAGNOSIS — G4733 Obstructive sleep apnea (adult) (pediatric): Secondary | ICD-10-CM | POA: Diagnosis not present

## 2022-10-12 DIAGNOSIS — Z9981 Dependence on supplemental oxygen: Secondary | ICD-10-CM | POA: Diagnosis not present

## 2022-10-12 DIAGNOSIS — I130 Hypertensive heart and chronic kidney disease with heart failure and stage 1 through stage 4 chronic kidney disease, or unspecified chronic kidney disease: Secondary | ICD-10-CM | POA: Diagnosis not present

## 2022-10-12 DIAGNOSIS — Z87891 Personal history of nicotine dependence: Secondary | ICD-10-CM | POA: Diagnosis not present

## 2022-10-12 DIAGNOSIS — Z8616 Personal history of COVID-19: Secondary | ICD-10-CM | POA: Diagnosis not present

## 2022-10-12 DIAGNOSIS — Z79621 Long term (current) use of calcineurin inhibitor: Secondary | ICD-10-CM | POA: Diagnosis not present

## 2022-10-12 DIAGNOSIS — E661 Drug-induced obesity: Secondary | ICD-10-CM | POA: Diagnosis not present

## 2022-10-12 DIAGNOSIS — N401 Enlarged prostate with lower urinary tract symptoms: Secondary | ICD-10-CM | POA: Diagnosis not present

## 2022-10-12 DIAGNOSIS — R338 Other retention of urine: Secondary | ICD-10-CM | POA: Diagnosis not present

## 2022-10-12 DIAGNOSIS — Z7952 Long term (current) use of systemic steroids: Secondary | ICD-10-CM | POA: Diagnosis not present

## 2022-10-12 DIAGNOSIS — Z85828 Personal history of other malignant neoplasm of skin: Secondary | ICD-10-CM | POA: Diagnosis not present

## 2022-10-12 DIAGNOSIS — E1122 Type 2 diabetes mellitus with diabetic chronic kidney disease: Secondary | ICD-10-CM | POA: Diagnosis not present

## 2022-10-12 DIAGNOSIS — K219 Gastro-esophageal reflux disease without esophagitis: Secondary | ICD-10-CM | POA: Diagnosis not present

## 2022-10-15 ENCOUNTER — Encounter: Payer: Self-pay | Admitting: Nurse Practitioner

## 2022-10-31 DIAGNOSIS — I5022 Chronic systolic (congestive) heart failure: Secondary | ICD-10-CM | POA: Diagnosis not present

## 2022-10-31 DIAGNOSIS — I130 Hypertensive heart and chronic kidney disease with heart failure and stage 1 through stage 4 chronic kidney disease, or unspecified chronic kidney disease: Secondary | ICD-10-CM | POA: Diagnosis not present

## 2022-10-31 DIAGNOSIS — N401 Enlarged prostate with lower urinary tract symptoms: Secondary | ICD-10-CM | POA: Diagnosis not present

## 2022-10-31 DIAGNOSIS — Z466 Encounter for fitting and adjustment of urinary device: Secondary | ICD-10-CM | POA: Diagnosis not present

## 2022-10-31 DIAGNOSIS — J449 Chronic obstructive pulmonary disease, unspecified: Secondary | ICD-10-CM | POA: Diagnosis not present

## 2022-10-31 DIAGNOSIS — R338 Other retention of urine: Secondary | ICD-10-CM | POA: Diagnosis not present

## 2022-11-03 DIAGNOSIS — Z006 Encounter for examination for normal comparison and control in clinical research program: Secondary | ICD-10-CM | POA: Diagnosis not present

## 2022-11-03 DIAGNOSIS — T86810 Lung transplant rejection: Secondary | ICD-10-CM | POA: Diagnosis not present

## 2022-11-03 DIAGNOSIS — J42 Unspecified chronic bronchitis: Secondary | ICD-10-CM | POA: Diagnosis not present

## 2022-11-03 DIAGNOSIS — Y830 Surgical operation with transplant of whole organ as the cause of abnormal reaction of the patient, or of later complication, without mention of misadventure at the time of the procedure: Secondary | ICD-10-CM | POA: Diagnosis not present

## 2022-11-03 DIAGNOSIS — J9621 Acute and chronic respiratory failure with hypoxia: Secondary | ICD-10-CM | POA: Diagnosis not present

## 2022-11-03 DIAGNOSIS — J4481 Bronchiolitis obliterans and bronchiolitis obliterans syndrome: Secondary | ICD-10-CM | POA: Diagnosis not present

## 2022-11-03 DIAGNOSIS — J849 Interstitial pulmonary disease, unspecified: Secondary | ICD-10-CM | POA: Diagnosis not present

## 2022-11-03 DIAGNOSIS — J410 Simple chronic bronchitis: Secondary | ICD-10-CM | POA: Diagnosis not present

## 2022-11-04 DIAGNOSIS — J42 Unspecified chronic bronchitis: Secondary | ICD-10-CM | POA: Diagnosis not present

## 2022-11-04 DIAGNOSIS — J9621 Acute and chronic respiratory failure with hypoxia: Secondary | ICD-10-CM | POA: Diagnosis not present

## 2022-11-04 DIAGNOSIS — J849 Interstitial pulmonary disease, unspecified: Secondary | ICD-10-CM | POA: Diagnosis not present

## 2022-11-04 DIAGNOSIS — J4481 Bronchiolitis obliterans and bronchiolitis obliterans syndrome: Secondary | ICD-10-CM | POA: Diagnosis not present

## 2022-11-04 DIAGNOSIS — Z006 Encounter for examination for normal comparison and control in clinical research program: Secondary | ICD-10-CM | POA: Diagnosis not present

## 2022-11-04 DIAGNOSIS — J410 Simple chronic bronchitis: Secondary | ICD-10-CM | POA: Diagnosis not present

## 2022-11-04 DIAGNOSIS — T86810 Lung transplant rejection: Secondary | ICD-10-CM | POA: Diagnosis not present

## 2022-11-04 DIAGNOSIS — Y830 Surgical operation with transplant of whole organ as the cause of abnormal reaction of the patient, or of later complication, without mention of misadventure at the time of the procedure: Secondary | ICD-10-CM | POA: Diagnosis not present

## 2022-11-11 DIAGNOSIS — G4733 Obstructive sleep apnea (adult) (pediatric): Secondary | ICD-10-CM | POA: Diagnosis not present

## 2022-11-11 DIAGNOSIS — E661 Drug-induced obesity: Secondary | ICD-10-CM | POA: Diagnosis not present

## 2022-11-11 DIAGNOSIS — N184 Chronic kidney disease, stage 4 (severe): Secondary | ICD-10-CM | POA: Diagnosis not present

## 2022-11-11 DIAGNOSIS — Z85828 Personal history of other malignant neoplasm of skin: Secondary | ICD-10-CM | POA: Diagnosis not present

## 2022-11-11 DIAGNOSIS — I5022 Chronic systolic (congestive) heart failure: Secondary | ICD-10-CM | POA: Diagnosis not present

## 2022-11-11 DIAGNOSIS — J449 Chronic obstructive pulmonary disease, unspecified: Secondary | ICD-10-CM | POA: Diagnosis not present

## 2022-11-11 DIAGNOSIS — Z7952 Long term (current) use of systemic steroids: Secondary | ICD-10-CM | POA: Diagnosis not present

## 2022-11-11 DIAGNOSIS — Z79621 Long term (current) use of calcineurin inhibitor: Secondary | ICD-10-CM | POA: Diagnosis not present

## 2022-11-11 DIAGNOSIS — Z8616 Personal history of COVID-19: Secondary | ICD-10-CM | POA: Diagnosis not present

## 2022-11-11 DIAGNOSIS — R338 Other retention of urine: Secondary | ICD-10-CM | POA: Diagnosis not present

## 2022-11-11 DIAGNOSIS — Z6832 Body mass index (BMI) 32.0-32.9, adult: Secondary | ICD-10-CM | POA: Diagnosis not present

## 2022-11-11 DIAGNOSIS — E1122 Type 2 diabetes mellitus with diabetic chronic kidney disease: Secondary | ICD-10-CM | POA: Diagnosis not present

## 2022-11-11 DIAGNOSIS — Z9981 Dependence on supplemental oxygen: Secondary | ICD-10-CM | POA: Diagnosis not present

## 2022-11-11 DIAGNOSIS — Z942 Lung transplant status: Secondary | ICD-10-CM | POA: Diagnosis not present

## 2022-11-11 DIAGNOSIS — I130 Hypertensive heart and chronic kidney disease with heart failure and stage 1 through stage 4 chronic kidney disease, or unspecified chronic kidney disease: Secondary | ICD-10-CM | POA: Diagnosis not present

## 2022-11-11 DIAGNOSIS — Z466 Encounter for fitting and adjustment of urinary device: Secondary | ICD-10-CM | POA: Diagnosis not present

## 2022-11-11 DIAGNOSIS — Z87891 Personal history of nicotine dependence: Secondary | ICD-10-CM | POA: Diagnosis not present

## 2022-11-11 DIAGNOSIS — N401 Enlarged prostate with lower urinary tract symptoms: Secondary | ICD-10-CM | POA: Diagnosis not present

## 2022-11-11 DIAGNOSIS — Z794 Long term (current) use of insulin: Secondary | ICD-10-CM | POA: Diagnosis not present

## 2022-11-11 DIAGNOSIS — K219 Gastro-esophageal reflux disease without esophagitis: Secondary | ICD-10-CM | POA: Diagnosis not present

## 2022-11-11 DIAGNOSIS — Z7901 Long term (current) use of anticoagulants: Secondary | ICD-10-CM | POA: Diagnosis not present

## 2022-11-11 DIAGNOSIS — K5909 Other constipation: Secondary | ICD-10-CM | POA: Diagnosis not present

## 2022-11-11 DIAGNOSIS — D509 Iron deficiency anemia, unspecified: Secondary | ICD-10-CM | POA: Diagnosis not present

## 2022-11-11 DIAGNOSIS — Z95810 Presence of automatic (implantable) cardiac defibrillator: Secondary | ICD-10-CM | POA: Diagnosis not present

## 2022-11-11 DIAGNOSIS — E785 Hyperlipidemia, unspecified: Secondary | ICD-10-CM | POA: Diagnosis not present

## 2022-11-18 DIAGNOSIS — T86818 Other complications of lung transplant: Secondary | ICD-10-CM | POA: Diagnosis not present

## 2022-11-18 DIAGNOSIS — I10 Essential (primary) hypertension: Secondary | ICD-10-CM | POA: Diagnosis not present

## 2022-11-18 DIAGNOSIS — Z7901 Long term (current) use of anticoagulants: Secondary | ICD-10-CM | POA: Diagnosis not present

## 2022-11-18 DIAGNOSIS — Z5181 Encounter for therapeutic drug level monitoring: Secondary | ICD-10-CM | POA: Diagnosis not present

## 2022-11-18 DIAGNOSIS — J8410 Pulmonary fibrosis, unspecified: Secondary | ICD-10-CM | POA: Diagnosis not present

## 2022-11-18 DIAGNOSIS — J984 Other disorders of lung: Secondary | ICD-10-CM | POA: Diagnosis not present

## 2022-11-18 DIAGNOSIS — Z79899 Other long term (current) drug therapy: Secondary | ICD-10-CM | POA: Diagnosis not present

## 2022-11-18 DIAGNOSIS — D849 Immunodeficiency, unspecified: Secondary | ICD-10-CM | POA: Diagnosis not present

## 2022-11-18 DIAGNOSIS — Z942 Lung transplant status: Secondary | ICD-10-CM | POA: Diagnosis not present

## 2022-11-18 DIAGNOSIS — N184 Chronic kidney disease, stage 4 (severe): Secondary | ICD-10-CM | POA: Diagnosis not present

## 2022-11-18 DIAGNOSIS — J4A9 Chronic lung allograft dysfunction, unspecified: Secondary | ICD-10-CM | POA: Diagnosis not present

## 2022-11-29 DIAGNOSIS — C44329 Squamous cell carcinoma of skin of other parts of face: Secondary | ICD-10-CM | POA: Diagnosis not present

## 2022-12-02 DIAGNOSIS — I130 Hypertensive heart and chronic kidney disease with heart failure and stage 1 through stage 4 chronic kidney disease, or unspecified chronic kidney disease: Secondary | ICD-10-CM | POA: Diagnosis not present

## 2022-12-02 DIAGNOSIS — J449 Chronic obstructive pulmonary disease, unspecified: Secondary | ICD-10-CM | POA: Diagnosis not present

## 2022-12-02 DIAGNOSIS — N401 Enlarged prostate with lower urinary tract symptoms: Secondary | ICD-10-CM | POA: Diagnosis not present

## 2022-12-02 DIAGNOSIS — Z466 Encounter for fitting and adjustment of urinary device: Secondary | ICD-10-CM | POA: Diagnosis not present

## 2022-12-02 DIAGNOSIS — R338 Other retention of urine: Secondary | ICD-10-CM | POA: Diagnosis not present

## 2022-12-02 DIAGNOSIS — I5022 Chronic systolic (congestive) heart failure: Secondary | ICD-10-CM | POA: Diagnosis not present

## 2022-12-07 DIAGNOSIS — Z466 Encounter for fitting and adjustment of urinary device: Secondary | ICD-10-CM | POA: Diagnosis not present

## 2022-12-07 DIAGNOSIS — R338 Other retention of urine: Secondary | ICD-10-CM | POA: Diagnosis not present

## 2022-12-07 DIAGNOSIS — I130 Hypertensive heart and chronic kidney disease with heart failure and stage 1 through stage 4 chronic kidney disease, or unspecified chronic kidney disease: Secondary | ICD-10-CM | POA: Diagnosis not present

## 2022-12-07 DIAGNOSIS — I5022 Chronic systolic (congestive) heart failure: Secondary | ICD-10-CM | POA: Diagnosis not present

## 2022-12-07 DIAGNOSIS — N401 Enlarged prostate with lower urinary tract symptoms: Secondary | ICD-10-CM | POA: Diagnosis not present

## 2022-12-07 DIAGNOSIS — J449 Chronic obstructive pulmonary disease, unspecified: Secondary | ICD-10-CM | POA: Diagnosis not present

## 2022-12-08 DIAGNOSIS — J42 Unspecified chronic bronchitis: Secondary | ICD-10-CM | POA: Diagnosis not present

## 2022-12-08 DIAGNOSIS — Z006 Encounter for examination for normal comparison and control in clinical research program: Secondary | ICD-10-CM | POA: Diagnosis not present

## 2022-12-08 DIAGNOSIS — T86810 Lung transplant rejection: Secondary | ICD-10-CM | POA: Diagnosis not present

## 2022-12-08 DIAGNOSIS — J9621 Acute and chronic respiratory failure with hypoxia: Secondary | ICD-10-CM | POA: Diagnosis not present

## 2022-12-08 DIAGNOSIS — J4481 Bronchiolitis obliterans and bronchiolitis obliterans syndrome: Secondary | ICD-10-CM | POA: Diagnosis not present

## 2022-12-09 DIAGNOSIS — J449 Chronic obstructive pulmonary disease, unspecified: Secondary | ICD-10-CM | POA: Diagnosis not present

## 2022-12-09 DIAGNOSIS — I5022 Chronic systolic (congestive) heart failure: Secondary | ICD-10-CM | POA: Diagnosis not present

## 2022-12-09 DIAGNOSIS — R0609 Other forms of dyspnea: Secondary | ICD-10-CM | POA: Diagnosis not present

## 2022-12-09 DIAGNOSIS — Z95810 Presence of automatic (implantable) cardiac defibrillator: Secondary | ICD-10-CM | POA: Diagnosis not present

## 2022-12-09 DIAGNOSIS — J4481 Bronchiolitis obliterans and bronchiolitis obliterans syndrome: Secondary | ICD-10-CM | POA: Diagnosis not present

## 2022-12-09 DIAGNOSIS — R55 Syncope and collapse: Secondary | ICD-10-CM | POA: Diagnosis not present

## 2022-12-09 DIAGNOSIS — T86810 Lung transplant rejection: Secondary | ICD-10-CM | POA: Diagnosis not present

## 2022-12-09 DIAGNOSIS — I4891 Unspecified atrial fibrillation: Secondary | ICD-10-CM | POA: Diagnosis not present

## 2022-12-09 DIAGNOSIS — J42 Unspecified chronic bronchitis: Secondary | ICD-10-CM | POA: Diagnosis not present

## 2022-12-09 DIAGNOSIS — I509 Heart failure, unspecified: Secondary | ICD-10-CM | POA: Diagnosis not present

## 2022-12-09 DIAGNOSIS — I10 Essential (primary) hypertension: Secondary | ICD-10-CM | POA: Diagnosis not present

## 2022-12-09 DIAGNOSIS — Z006 Encounter for examination for normal comparison and control in clinical research program: Secondary | ICD-10-CM | POA: Diagnosis not present

## 2022-12-13 DIAGNOSIS — Z5181 Encounter for therapeutic drug level monitoring: Secondary | ICD-10-CM | POA: Diagnosis not present

## 2022-12-13 DIAGNOSIS — D509 Iron deficiency anemia, unspecified: Secondary | ICD-10-CM | POA: Diagnosis not present

## 2022-12-13 DIAGNOSIS — I5043 Acute on chronic combined systolic (congestive) and diastolic (congestive) heart failure: Secondary | ICD-10-CM | POA: Diagnosis not present

## 2022-12-13 DIAGNOSIS — E1165 Type 2 diabetes mellitus with hyperglycemia: Secondary | ICD-10-CM | POA: Diagnosis not present

## 2022-12-13 DIAGNOSIS — D84821 Immunodeficiency due to drugs: Secondary | ICD-10-CM | POA: Diagnosis not present

## 2022-12-13 DIAGNOSIS — R072 Precordial pain: Secondary | ICD-10-CM | POA: Diagnosis not present

## 2022-12-13 DIAGNOSIS — Z95810 Presence of automatic (implantable) cardiac defibrillator: Secondary | ICD-10-CM | POA: Diagnosis not present

## 2022-12-13 DIAGNOSIS — N400 Enlarged prostate without lower urinary tract symptoms: Secondary | ICD-10-CM | POA: Diagnosis not present

## 2022-12-13 DIAGNOSIS — E1122 Type 2 diabetes mellitus with diabetic chronic kidney disease: Secondary | ICD-10-CM | POA: Diagnosis not present

## 2022-12-13 DIAGNOSIS — N184 Chronic kidney disease, stage 4 (severe): Secondary | ICD-10-CM | POA: Diagnosis not present

## 2022-12-13 DIAGNOSIS — R0600 Dyspnea, unspecified: Secondary | ICD-10-CM | POA: Diagnosis not present

## 2022-12-13 DIAGNOSIS — F32A Depression, unspecified: Secondary | ICD-10-CM | POA: Diagnosis not present

## 2022-12-13 DIAGNOSIS — Z8616 Personal history of COVID-19: Secondary | ICD-10-CM | POA: Diagnosis not present

## 2022-12-13 DIAGNOSIS — Z79624 Long term (current) use of inhibitors of nucleotide synthesis: Secondary | ICD-10-CM | POA: Diagnosis not present

## 2022-12-13 DIAGNOSIS — Z7901 Long term (current) use of anticoagulants: Secondary | ICD-10-CM | POA: Diagnosis not present

## 2022-12-13 DIAGNOSIS — D638 Anemia in other chronic diseases classified elsewhere: Secondary | ICD-10-CM | POA: Diagnosis not present

## 2022-12-13 DIAGNOSIS — I2089 Other forms of angina pectoris: Secondary | ICD-10-CM | POA: Diagnosis not present

## 2022-12-13 DIAGNOSIS — I130 Hypertensive heart and chronic kidney disease with heart failure and stage 1 through stage 4 chronic kidney disease, or unspecified chronic kidney disease: Secondary | ICD-10-CM | POA: Diagnosis not present

## 2022-12-13 DIAGNOSIS — Z87891 Personal history of nicotine dependence: Secondary | ICD-10-CM | POA: Diagnosis not present

## 2022-12-13 DIAGNOSIS — Z942 Lung transplant status: Secondary | ICD-10-CM | POA: Diagnosis not present

## 2022-12-13 DIAGNOSIS — G4733 Obstructive sleep apnea (adult) (pediatric): Secondary | ICD-10-CM | POA: Diagnosis not present

## 2022-12-13 DIAGNOSIS — T451X5A Adverse effect of antineoplastic and immunosuppressive drugs, initial encounter: Secondary | ICD-10-CM | POA: Diagnosis not present

## 2022-12-13 DIAGNOSIS — K219 Gastro-esophageal reflux disease without esophagitis: Secondary | ICD-10-CM | POA: Diagnosis not present

## 2022-12-13 DIAGNOSIS — Z794 Long term (current) use of insulin: Secondary | ICD-10-CM | POA: Diagnosis not present

## 2022-12-13 DIAGNOSIS — D849 Immunodeficiency, unspecified: Secondary | ICD-10-CM | POA: Diagnosis not present

## 2022-12-13 DIAGNOSIS — R918 Other nonspecific abnormal finding of lung field: Secondary | ICD-10-CM | POA: Diagnosis not present

## 2022-12-13 DIAGNOSIS — J9611 Chronic respiratory failure with hypoxia: Secondary | ICD-10-CM | POA: Diagnosis not present

## 2022-12-13 DIAGNOSIS — Z79621 Long term (current) use of calcineurin inhibitor: Secondary | ICD-10-CM | POA: Diagnosis not present

## 2022-12-13 DIAGNOSIS — E785 Hyperlipidemia, unspecified: Secondary | ICD-10-CM | POA: Diagnosis not present

## 2022-12-13 DIAGNOSIS — I428 Other cardiomyopathies: Secondary | ICD-10-CM | POA: Diagnosis not present

## 2022-12-13 DIAGNOSIS — D899 Disorder involving the immune mechanism, unspecified: Secondary | ICD-10-CM | POA: Diagnosis not present

## 2022-12-13 DIAGNOSIS — I4891 Unspecified atrial fibrillation: Secondary | ICD-10-CM | POA: Diagnosis not present

## 2022-12-13 DIAGNOSIS — R0609 Other forms of dyspnea: Secondary | ICD-10-CM | POA: Diagnosis not present

## 2022-12-13 DIAGNOSIS — K5900 Constipation, unspecified: Secondary | ICD-10-CM | POA: Diagnosis not present

## 2022-12-13 DIAGNOSIS — I509 Heart failure, unspecified: Secondary | ICD-10-CM | POA: Diagnosis not present

## 2022-12-13 DIAGNOSIS — Z79899 Other long term (current) drug therapy: Secondary | ICD-10-CM | POA: Diagnosis not present

## 2022-12-29 DIAGNOSIS — Z79899 Other long term (current) drug therapy: Secondary | ICD-10-CM | POA: Diagnosis not present

## 2022-12-29 DIAGNOSIS — Z006 Encounter for examination for normal comparison and control in clinical research program: Secondary | ICD-10-CM | POA: Diagnosis not present

## 2022-12-29 DIAGNOSIS — J4A9 Chronic lung allograft dysfunction, unspecified: Secondary | ICD-10-CM | POA: Diagnosis not present

## 2022-12-29 DIAGNOSIS — T86810 Lung transplant rejection: Secondary | ICD-10-CM | POA: Diagnosis not present

## 2022-12-30 DIAGNOSIS — Z95810 Presence of automatic (implantable) cardiac defibrillator: Secondary | ICD-10-CM | POA: Insufficient documentation

## 2023-01-03 DIAGNOSIS — I428 Other cardiomyopathies: Secondary | ICD-10-CM | POA: Diagnosis not present

## 2023-01-03 DIAGNOSIS — R008 Other abnormalities of heart beat: Secondary | ICD-10-CM | POA: Diagnosis not present

## 2023-01-03 DIAGNOSIS — Z95810 Presence of automatic (implantable) cardiac defibrillator: Secondary | ICD-10-CM | POA: Diagnosis not present

## 2023-01-03 DIAGNOSIS — N184 Chronic kidney disease, stage 4 (severe): Secondary | ICD-10-CM | POA: Diagnosis not present

## 2023-01-03 DIAGNOSIS — R0789 Other chest pain: Secondary | ICD-10-CM | POA: Diagnosis not present

## 2023-01-03 DIAGNOSIS — Z7901 Long term (current) use of anticoagulants: Secondary | ICD-10-CM | POA: Insufficient documentation

## 2023-01-03 DIAGNOSIS — I130 Hypertensive heart and chronic kidney disease with heart failure and stage 1 through stage 4 chronic kidney disease, or unspecified chronic kidney disease: Secondary | ICD-10-CM | POA: Diagnosis not present

## 2023-01-03 DIAGNOSIS — I447 Left bundle-branch block, unspecified: Secondary | ICD-10-CM | POA: Diagnosis not present

## 2023-01-03 DIAGNOSIS — I5023 Acute on chronic systolic (congestive) heart failure: Secondary | ICD-10-CM | POA: Diagnosis not present

## 2023-01-03 DIAGNOSIS — Z942 Lung transplant status: Secondary | ICD-10-CM | POA: Diagnosis not present

## 2023-01-03 DIAGNOSIS — I471 Supraventricular tachycardia, unspecified: Secondary | ICD-10-CM | POA: Diagnosis not present

## 2023-01-03 DIAGNOSIS — R319 Hematuria, unspecified: Secondary | ICD-10-CM | POA: Diagnosis not present

## 2023-01-03 DIAGNOSIS — Z4502 Encounter for adjustment and management of automatic implantable cardiac defibrillator: Secondary | ICD-10-CM | POA: Diagnosis not present

## 2023-01-06 DIAGNOSIS — N184 Chronic kidney disease, stage 4 (severe): Secondary | ICD-10-CM | POA: Diagnosis not present

## 2023-01-06 DIAGNOSIS — J4A9 Chronic lung allograft dysfunction, unspecified: Secondary | ICD-10-CM | POA: Diagnosis not present

## 2023-01-06 DIAGNOSIS — J9859 Other diseases of mediastinum, not elsewhere classified: Secondary | ICD-10-CM | POA: Diagnosis not present

## 2023-01-06 DIAGNOSIS — T86818 Other complications of lung transplant: Secondary | ICD-10-CM | POA: Diagnosis not present

## 2023-01-06 DIAGNOSIS — J9811 Atelectasis: Secondary | ICD-10-CM | POA: Diagnosis not present

## 2023-01-06 DIAGNOSIS — Z79899 Other long term (current) drug therapy: Secondary | ICD-10-CM | POA: Diagnosis not present

## 2023-01-06 DIAGNOSIS — D849 Immunodeficiency, unspecified: Secondary | ICD-10-CM | POA: Diagnosis not present

## 2023-01-06 DIAGNOSIS — Z942 Lung transplant status: Secondary | ICD-10-CM | POA: Diagnosis not present

## 2023-01-06 DIAGNOSIS — J8410 Pulmonary fibrosis, unspecified: Secondary | ICD-10-CM | POA: Diagnosis not present

## 2023-01-06 DIAGNOSIS — I10 Essential (primary) hypertension: Secondary | ICD-10-CM | POA: Diagnosis not present

## 2023-01-06 DIAGNOSIS — Z5181 Encounter for therapeutic drug level monitoring: Secondary | ICD-10-CM | POA: Diagnosis not present

## 2023-01-06 DIAGNOSIS — Z7901 Long term (current) use of anticoagulants: Secondary | ICD-10-CM | POA: Diagnosis not present

## 2023-01-26 DIAGNOSIS — E1165 Type 2 diabetes mellitus with hyperglycemia: Secondary | ICD-10-CM | POA: Diagnosis not present

## 2023-01-26 DIAGNOSIS — Z794 Long term (current) use of insulin: Secondary | ICD-10-CM | POA: Diagnosis not present

## 2023-01-26 DIAGNOSIS — I152 Hypertension secondary to endocrine disorders: Secondary | ICD-10-CM | POA: Diagnosis not present

## 2023-01-26 DIAGNOSIS — E1169 Type 2 diabetes mellitus with other specified complication: Secondary | ICD-10-CM | POA: Diagnosis not present

## 2023-01-26 DIAGNOSIS — E785 Hyperlipidemia, unspecified: Secondary | ICD-10-CM | POA: Diagnosis not present

## 2023-01-26 DIAGNOSIS — E1159 Type 2 diabetes mellitus with other circulatory complications: Secondary | ICD-10-CM | POA: Diagnosis not present

## 2023-02-09 DIAGNOSIS — H04123 Dry eye syndrome of bilateral lacrimal glands: Secondary | ICD-10-CM | POA: Diagnosis not present

## 2023-02-09 DIAGNOSIS — H353131 Nonexudative age-related macular degeneration, bilateral, early dry stage: Secondary | ICD-10-CM | POA: Diagnosis not present

## 2023-02-09 DIAGNOSIS — H538 Other visual disturbances: Secondary | ICD-10-CM | POA: Diagnosis not present

## 2023-02-09 DIAGNOSIS — E113293 Type 2 diabetes mellitus with mild nonproliferative diabetic retinopathy without macular edema, bilateral: Secondary | ICD-10-CM | POA: Diagnosis not present

## 2023-02-09 DIAGNOSIS — H02105 Unspecified ectropion of left lower eyelid: Secondary | ICD-10-CM | POA: Diagnosis not present

## 2023-02-23 DIAGNOSIS — J4481 Bronchiolitis obliterans and bronchiolitis obliterans syndrome: Secondary | ICD-10-CM | POA: Diagnosis not present

## 2023-02-23 DIAGNOSIS — T86810 Lung transplant rejection: Secondary | ICD-10-CM | POA: Diagnosis not present

## 2023-02-23 DIAGNOSIS — Z006 Encounter for examination for normal comparison and control in clinical research program: Secondary | ICD-10-CM | POA: Diagnosis not present

## 2023-02-23 DIAGNOSIS — J42 Unspecified chronic bronchitis: Secondary | ICD-10-CM | POA: Diagnosis not present

## 2023-02-23 DIAGNOSIS — J9621 Acute and chronic respiratory failure with hypoxia: Secondary | ICD-10-CM | POA: Diagnosis not present

## 2023-02-23 DIAGNOSIS — J849 Interstitial pulmonary disease, unspecified: Secondary | ICD-10-CM | POA: Diagnosis not present

## 2023-02-24 DIAGNOSIS — J410 Simple chronic bronchitis: Secondary | ICD-10-CM | POA: Diagnosis not present

## 2023-02-24 DIAGNOSIS — T86810 Lung transplant rejection: Secondary | ICD-10-CM | POA: Diagnosis not present

## 2023-02-24 DIAGNOSIS — J849 Interstitial pulmonary disease, unspecified: Secondary | ICD-10-CM | POA: Diagnosis not present

## 2023-02-24 DIAGNOSIS — J4481 Bronchiolitis obliterans and bronchiolitis obliterans syndrome: Secondary | ICD-10-CM | POA: Diagnosis not present

## 2023-02-24 DIAGNOSIS — Z006 Encounter for examination for normal comparison and control in clinical research program: Secondary | ICD-10-CM | POA: Diagnosis not present

## 2023-02-24 DIAGNOSIS — J42 Unspecified chronic bronchitis: Secondary | ICD-10-CM | POA: Diagnosis not present

## 2023-03-10 DIAGNOSIS — Z95810 Presence of automatic (implantable) cardiac defibrillator: Secondary | ICD-10-CM | POA: Diagnosis not present

## 2023-03-10 DIAGNOSIS — R0609 Other forms of dyspnea: Secondary | ICD-10-CM | POA: Diagnosis not present

## 2023-03-10 DIAGNOSIS — I5022 Chronic systolic (congestive) heart failure: Secondary | ICD-10-CM | POA: Diagnosis not present

## 2023-03-10 DIAGNOSIS — J449 Chronic obstructive pulmonary disease, unspecified: Secondary | ICD-10-CM | POA: Diagnosis not present

## 2023-03-10 DIAGNOSIS — I4891 Unspecified atrial fibrillation: Secondary | ICD-10-CM | POA: Diagnosis not present

## 2023-03-10 DIAGNOSIS — I509 Heart failure, unspecified: Secondary | ICD-10-CM | POA: Diagnosis not present

## 2023-03-10 DIAGNOSIS — Z4502 Encounter for adjustment and management of automatic implantable cardiac defibrillator: Secondary | ICD-10-CM | POA: Diagnosis not present

## 2023-03-10 DIAGNOSIS — Z7901 Long term (current) use of anticoagulants: Secondary | ICD-10-CM | POA: Diagnosis not present

## 2023-03-10 DIAGNOSIS — I10 Essential (primary) hypertension: Secondary | ICD-10-CM | POA: Diagnosis not present

## 2023-03-10 DIAGNOSIS — R55 Syncope and collapse: Secondary | ICD-10-CM | POA: Diagnosis not present

## 2023-03-10 DIAGNOSIS — R0602 Shortness of breath: Secondary | ICD-10-CM | POA: Diagnosis not present

## 2023-03-23 DIAGNOSIS — T86810 Lung transplant rejection: Secondary | ICD-10-CM | POA: Diagnosis not present

## 2023-03-23 DIAGNOSIS — Z942 Lung transplant status: Secondary | ICD-10-CM | POA: Diagnosis not present

## 2023-03-23 DIAGNOSIS — I129 Hypertensive chronic kidney disease with stage 1 through stage 4 chronic kidney disease, or unspecified chronic kidney disease: Secondary | ICD-10-CM | POA: Diagnosis not present

## 2023-03-23 DIAGNOSIS — N400 Enlarged prostate without lower urinary tract symptoms: Secondary | ICD-10-CM | POA: Diagnosis not present

## 2023-03-23 DIAGNOSIS — I4891 Unspecified atrial fibrillation: Secondary | ICD-10-CM | POA: Diagnosis not present

## 2023-03-23 DIAGNOSIS — I5022 Chronic systolic (congestive) heart failure: Secondary | ICD-10-CM | POA: Diagnosis not present

## 2023-03-23 DIAGNOSIS — I5032 Chronic diastolic (congestive) heart failure: Secondary | ICD-10-CM | POA: Diagnosis not present

## 2023-03-23 DIAGNOSIS — N184 Chronic kidney disease, stage 4 (severe): Secondary | ICD-10-CM | POA: Diagnosis not present

## 2023-03-23 DIAGNOSIS — E1122 Type 2 diabetes mellitus with diabetic chronic kidney disease: Secondary | ICD-10-CM | POA: Diagnosis not present

## 2023-03-23 DIAGNOSIS — I428 Other cardiomyopathies: Secondary | ICD-10-CM | POA: Diagnosis not present

## 2023-03-23 DIAGNOSIS — J849 Interstitial pulmonary disease, unspecified: Secondary | ICD-10-CM | POA: Diagnosis not present

## 2023-03-23 DIAGNOSIS — Z79899 Other long term (current) drug therapy: Secondary | ICD-10-CM | POA: Diagnosis not present

## 2023-03-23 DIAGNOSIS — Z006 Encounter for examination for normal comparison and control in clinical research program: Secondary | ICD-10-CM | POA: Diagnosis not present

## 2023-03-23 DIAGNOSIS — J42 Unspecified chronic bronchitis: Secondary | ICD-10-CM | POA: Diagnosis not present

## 2023-03-24 DIAGNOSIS — T86810 Lung transplant rejection: Secondary | ICD-10-CM | POA: Diagnosis not present

## 2023-03-24 DIAGNOSIS — J4481 Bronchiolitis obliterans and bronchiolitis obliterans syndrome: Secondary | ICD-10-CM | POA: Diagnosis not present

## 2023-03-24 DIAGNOSIS — Z006 Encounter for examination for normal comparison and control in clinical research program: Secondary | ICD-10-CM | POA: Diagnosis not present

## 2023-03-24 DIAGNOSIS — J9621 Acute and chronic respiratory failure with hypoxia: Secondary | ICD-10-CM | POA: Diagnosis not present

## 2023-03-24 DIAGNOSIS — J42 Unspecified chronic bronchitis: Secondary | ICD-10-CM | POA: Diagnosis not present

## 2023-04-03 ENCOUNTER — Telehealth: Payer: Self-pay | Admitting: Nurse Practitioner

## 2023-04-03 DIAGNOSIS — R0602 Shortness of breath: Secondary | ICD-10-CM | POA: Diagnosis not present

## 2023-04-03 DIAGNOSIS — R531 Weakness: Secondary | ICD-10-CM | POA: Diagnosis not present

## 2023-04-03 DIAGNOSIS — Z03818 Encounter for observation for suspected exposure to other biological agents ruled out: Secondary | ICD-10-CM | POA: Diagnosis not present

## 2023-04-03 DIAGNOSIS — R051 Acute cough: Secondary | ICD-10-CM | POA: Diagnosis not present

## 2023-04-03 DIAGNOSIS — C44329 Squamous cell carcinoma of skin of other parts of face: Secondary | ICD-10-CM | POA: Diagnosis not present

## 2023-04-03 NOTE — Telephone Encounter (Signed)
Left vm and sent mychart message to confirm 04/10/23 appointment-Toni

## 2023-04-10 ENCOUNTER — Encounter: Payer: Self-pay | Admitting: Nurse Practitioner

## 2023-04-10 ENCOUNTER — Ambulatory Visit (INDEPENDENT_AMBULATORY_CARE_PROVIDER_SITE_OTHER): Payer: Medicare Other | Admitting: Nurse Practitioner

## 2023-04-10 VITALS — BP 138/86 | HR 79 | Temp 98.2°F | Resp 16 | Ht 69.0 in | Wt 206.4 lb

## 2023-04-10 DIAGNOSIS — J449 Chronic obstructive pulmonary disease, unspecified: Secondary | ICD-10-CM | POA: Diagnosis not present

## 2023-04-10 DIAGNOSIS — E1169 Type 2 diabetes mellitus with other specified complication: Secondary | ICD-10-CM

## 2023-04-10 DIAGNOSIS — Z794 Long term (current) use of insulin: Secondary | ICD-10-CM

## 2023-04-10 DIAGNOSIS — E1165 Type 2 diabetes mellitus with hyperglycemia: Secondary | ICD-10-CM

## 2023-04-10 DIAGNOSIS — T86811 Lung transplant failure: Secondary | ICD-10-CM | POA: Diagnosis not present

## 2023-04-10 DIAGNOSIS — Z9981 Dependence on supplemental oxygen: Secondary | ICD-10-CM | POA: Diagnosis not present

## 2023-04-10 DIAGNOSIS — Z0000 Encounter for general adult medical examination without abnormal findings: Secondary | ICD-10-CM

## 2023-04-10 NOTE — Progress Notes (Signed)
[facility] [address] [place], [place] [address]  Internal MEDICINE  Office Visit Note  Patient Name: Caleb [name]  [id number]  [id number]  Date of Service: 04/10/2023  Chief Complaint  Patient presents with   Diabetes   Hypertension   Hyperlipidemia   Medicare Wellness    HPI Caleb [name] presents for an annual well visit and physical exam.  Well-appearing 77 y.o. male on continuous supplemental oxygen but otherwise well-appearing.  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 --upcoming cataract surgery of left eye next week, and then the right eye at the end of the month.  --routine labs are up to date right now. Routine CRC screening: due in 2025 Diabetic eye exam -- sees eye doctor regularly Foot exam -- done by endocrinology.  Labs: gets labs done frequently through many specialists.  New or worsening pain: none Other concerns: goes to [facility] for diabetes -- has done cmp and urine microalbumin in the past year .   All medications are prescribed by his specialists.  Specialists:  Endocrinologist: Dr. Verdis Frederickson at [facility] Nephrology at [facility]: Dr. Marlinda Mike Dermatology -- sees [facility] dermatology Oncologist: Dr Gae Dry at [facility] Cardiologist: Dr. Sande Rives at [facility] Urologist: Dr. Phillips Odor Gn at [facility] Transplant team -- at [facility]     04/10/2023    2:04 PM 04/04/2022    2:12 PM 03/29/2021    2:58 PM  MMSE - Mini Mental State Exam  Orientation to time 5 5 5   Orientation to Place 5 5 5   Registration 3 3 3   Attention/ Calculation 5 5 5   Recall 3 3 3   Language- name 2 objects 2 2 2   Language- repeat 1 1 1   Language- follow 3 step command 3 3 3   Language- read & follow direction 1 1 1   Write a sentence 0 0 1  Copy design 1 1 1   Total score 29 29 30     Functional Status Survey: Is the patient deaf or have difficulty hearing?: Yes Does the patient have difficulty seeing, even when wearing  glasses/contacts?: Yes Does the patient have difficulty concentrating, remembering, or making decisions?: No Does the patient have difficulty walking or climbing stairs?: Yes Does the patient have difficulty dressing or bathing?: Yes Does the patient have difficulty doing errands alone such as visiting a doctor's office or shopping?: Yes     04/04/2022    2:10 PM 04/11/2022    9:45 AM 04/25/2022    6:41 AM 10/03/2022    1:27 PM 04/10/2023    2:03 PM  Fall Risk  Falls in the past year? 0   0 0  Was there an injury with Fall? 0   0 0  Fall Risk Category Calculator 0   0 0  Fall Risk Category (Retired) Low      (RETIRED) Patient Fall Risk Level Low fall risk Moderate fall risk Moderate fall risk    Patient at Risk for Falls Due to No Fall Risks   No Fall Risks No Fall Risks  Fall risk Follow up Falls evaluation completed   Falls evaluation completed Falls evaluation completed       04/10/2023    2:03 PM  Depression screen PHQ 2/9  Decreased Interest 0  Down, Depressed, Hopeless 0  PHQ - 2 Score 0        Current Medication: Outpatient Encounter Medications as of 04/10/2023  Medication Sig Note   acetaminophen (TYLENOL) 325 MG  tablet Take 650 mg by mouth every 6 (six) hours as needed.    albuterol (VENTOLIN HFA) 108 (90 Base) MCG/ACT inhaler Inhale 2 puffs into the lungs every 6 (six) hours as needed for wheezing or shortness of breath.    apixaban (ELIQUIS) 2.5 MG TABS tablet Take 2.5 mg by mouth 2 (two) times daily.    calcium acetate, Phos Binder, (PHOSLYRA) 667 MG/5ML SOLN Take 1,334 mg by mouth 3 (three) times daily with meals.    Cholecalciferol (VITAMIN D3) 125 MCG (5000 UT) TBDP Take by mouth.    ferrous sulfate 324 MG TBEC Take 324 mg by mouth daily. With lunch    finasteride (PROSCAR) 5 MG tablet Take 5 mg by mouth daily.    FLUoxetine (PROZAC) 10 MG capsule Take 10 mg by mouth daily.    glucagon 1 MG injection For emergency use    Insulin Syringe-Needle U-100 (BD  VEO INSULIN SYRINGE U/F) 31G X 15/64" 0.3 ML MISC Use 1 Syringe 4 (four) times daily    ketoconazole (NIZORAL) 2 % cream Apply 1 Application topically 2 (two) times daily. foot    Magnesium 200 MG TABS Take by mouth. Take 1 tab po daily    mometasone-formoterol (DULERA) 200-5 MCG/ACT AERO Inhale into the lungs.    Multiple Vitamin (MULTI-VITAMIN) tablet Take 1 tablet by mouth daily.    mycophenolate (CELLCEPT) 250 MG capsule Take by mouth 2 (two) times daily.    OXYGEN Inhale 2 L into the lungs. At night and with exertion    pantoprazole (PROTONIX) 40 MG tablet Take 40 mg by mouth 2 (two) times daily.     polyethylene glycol (MIRALAX / GLYCOLAX) 17 g packet Take 17 g by mouth daily.    predniSONE (DELTASONE) 5 MG tablet Take 5 mg by mouth daily with breakfast.    propranolol (INDERAL) 20 MG tablet Take 20 mg by mouth 2 (two) times daily.    rosuvastatin (CRESTOR) 10 MG tablet Take 10 mg by mouth daily.    senna-docusate (SENOKOT-S) 8.6-50 MG tablet Take 1 tablet by mouth 2 (two) times daily.    sulfamethoxazole-trimethoprim (BACTRIM) 400-80 MG tablet Take 1 tablet by mouth 3 (three) times a week. Mon, Wed, Fri    tacrolimus (PROGRAF) 0.5 MG capsule Take in combination with 1mg  capsules for total dose of 1mg  by mouth daily and 1.5mg  by mouth nightly.    tacrolimus (PROGRAF) 1 MG capsule Take 1 mg by mouth 2 (two) times daily.  01/14/2020: Takes 1mg  PO BID   tamsulosin (FLOMAX) 0.4 MG CAPS Take 0.4 mg by mouth daily after breakfast. 1 on saturdays    torsemide (DEMADEX) 20 MG tablet Take 60mg  by mouth twice daily x 3 days (9/3-9/5), then decrease to 40mg  by mouth twice daily.  Take additional 40mg  by mouth if weight gain increases by 2 lbs in 1 day or 5 lbs in 1 week.    valGANciclovir (VALCYTE) 450 MG tablet Take 450 mg by mouth every other day. Take every Monday and thursday 01/13/2020: Due today, takes in the evening every other day   [DISCONTINUED] predniSONE (DELTASONE) 10 MG tablet Take 4  tablets by mouth on day 1, then 3 tablets by mouth on day 2, then 2 tablets by mouth on day 3 and 1 tablet by mouth on day 4, then stop.    azithromycin (ZITHROMAX) 250 MG tablet Take 250 mg by mouth 3 (three) times a week.    budesonide-formoterol (SYMBICORT) 160-4.5 MCG/ACT inhaler Inhale 2 puffs  into the lungs 2 (two) times daily.     insulin lispro (HUMALOG) 100 UNIT/ML KwikPen Inject into the skin.    No facility-administered encounter medications on file as of 04/10/2023.    Surgical History: Past Surgical History:  Procedure Laterality Date   BRONCHOSCOPY     multiple   CARDIAC DEFIBRILLATOR PLACEMENT  10/18/2019   [facility]   CATARACT EXTRACTION W/PHACO Left 04/11/2022   Procedure: CATARACT EXTRACTION PHACO AND INTRAOCULAR LENS PLACEMENT (IOC) LEFT DIABETIC;  Surgeon: Nevada Crane, MD;  Location: [facility];  Service: Ophthalmology;  Laterality: Left;  4.20 0:46.5   CATARACT EXTRACTION W/PHACO Right 04/25/2022   Procedure: CATARACT EXTRACTION PHACO AND INTRAOCULAR LENS PLACEMENT (IOC) RIGHT DIABETIC;  Surgeon: Nevada Crane, MD;  Location: [facility];  Service: Ophthalmology;  Laterality: Right;  3.98 0:36.8   HIP FRACTURE SURGERY Right 12/2019   [facility]   LUNG TRANSPLANT      Medical History: 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   Atrial fibrillation (HCC)    BPH (benign prostatic hyperplasia)    Chest pain, unspecified    Chronic heart failure with preserved ejection fraction (HCC)    Chronic rejection of allograft lung (HCC)    CKD (chronic kidney disease), stage IV (HCC)    COPD (chronic obstructive pulmonary disease) (HCC)    Diabetes mellitus without complication (HCC)    Dyspnea    Hyperlipidemia    Hypertension    Interstitial lung disease (HCC)    Lung transplant recipient [facility]) 2016   [facility] - Right   Sleep apnea     Family History: Family History  Problem Relation  Age of Onset   Coronary artery disease Other    Diabetes Other     Social History   Socioeconomic History   Marital status: Married    Spouse name: Not on file   Number of children: Not on file   Years of education: Not on file   Highest education level: Not on file  Occupational History   Not on file  Tobacco Use   Smoking status: Former    Current packs/day: 0.00    Types: Cigarettes    Quit date: 05/16/1979    Years since quitting: 44.1   Smokeless tobacco: Never   Tobacco comments:    Quit 1980  Vaping Use   Vaping status: Never Used  Substance and Sexual Activity   Alcohol use: Not Currently   Drug use: No   Sexual activity: Not on file  Other Topics Concern   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    Social Drivers of Corporate investment banker Strain: Low Risk  (12/21/2022)   Received from [facility]   Overall Financial Resource Strain (CARDIA)    Difficulty of Paying Living Expenses: Not very hard  Food Insecurity: No Food Insecurity (12/21/2022)   Received from [facility]   Hunger Vital Sign    Worried About Running Out of Food in the Last Year: Never true    Ran Out of Food in the Last Year: Never true  Transportation Needs: No Transportation Needs (12/21/2022)   Received from [facility] - Transportation    In the past 12 months, has lack of transportation kept you from medical appointments or from getting medications?: No    Lack of Transportation (Non-Medical): No  Physical Activity: Sufficiently Active (12/27/2020)   Received from [facility], [facility]   Exercise Vital Sign    Days of Exercise per Week: 3 days    Minutes of Exercise per Session: 90 min  Stress: No Stress Concern Present (12/27/2020)   Received from [facility], [facility]   [employer] of Occupational Health - Occupational Stress Questionnaire    Feeling of Stress : Not at all  Social Connections: Moderately Isolated (12/27/2020)   [facility]   Social Connection and Isolation Panel [NHANES]    Frequency of Communication with Friends and Family: More than three times a week    Frequency of Social Gatherings with Friends and Family: Never    Attends Religious Services: Never    Database administrator or Organizations: No    Attends Engineer, structural: Never    Marital Status: Married  Catering manager Violence: Not on file      Review of Systems  Constitutional:  Negative for activity change, appetite change, chills, fatigue, fever and unexpected weight change.  HENT: Negative.  Negative for congestion, ear pain, rhinorrhea, sore throat and trouble swallowing.   Eyes: Negative.   Respiratory:  Positive for cough, shortness of breath and wheezing. Negative for chest tightness.        On continuous supplemental oxygen  Cardiovascular: Negative.  Negative for chest pain.  Gastrointestinal: Negative.  Negative for abdominal pain, blood in stool, constipation, diarrhea, nausea and vomiting.  Endocrine: Negative.   Genitourinary: Negative.  Negative for difficulty urinating, dysuria, frequency, hematuria and urgency.  Musculoskeletal: Negative.  Negative for arthralgias, back pain, joint swelling, myalgias and neck pain.  Skin: Negative.  Negative for rash and wound.  Allergic/Immunologic: Negative.  Negative for immunocompromised state.  Neurological: Negative.  Negative for dizziness, seizures, numbness and headaches.  Hematological: Negative.   Psychiatric/Behavioral: Negative.  Negative for behavioral problems, self-injury and suicidal ideas. The patient is not nervous/anxious.     Vital Signs: BP 138/86   Pulse 79   Temp 98.2 F (36.8 C)   Resp 16   Ht 5\' 9"  (1.753 m)   Wt 206  lb 6.4 oz (93.6 kg)   SpO2 98% Comment: 3L  BMI 30.48 kg/m    Physical Exam Vitals reviewed.  Constitutional:      General: He is awake. He is not in acute distress.    Appearance: Normal appearance. He is well-developed and well-groomed. He is obese. He is not ill-appearing or diaphoretic.     Interventions: Nasal cannula in place.  HENT:     Head: Normocephalic and atraumatic.     Right Ear: Tympanic membrane, ear canal and external ear normal.     Left Ear: Tympanic membrane, ear canal and external ear normal.     Nose: Nose normal. No congestion or rhinorrhea.     Mouth/Throat:     Lips: Pink.     Mouth: Mucous membranes are moist.     Pharynx: Oropharynx is clear. Uvula midline. No oropharyngeal exudate or posterior oropharyngeal erythema.  Eyes:     General: Lids are normal. Vision grossly intact. Gaze aligned appropriately. No scleral icterus.       Right eye: No discharge.        Left eye: No discharge.     Extraocular Movements: Extraocular movements intact.     Conjunctiva/sclera: Conjunctivae normal.  Pupils: Pupils are equal, round, and reactive to light.     Funduscopic exam:    Right eye: Red reflex present.        Left eye: Red reflex present. Neck:     Thyroid: No thyromegaly.     Vascular: No carotid bruit or JVD.     Trachea: Trachea and phonation normal. No tracheal deviation.  Cardiovascular:     Rate and Rhythm: Normal rate and regular rhythm.     Pulses: Normal pulses.     Heart sounds: Normal heart sounds, S1 normal and S2 normal. No murmur heard.    No friction rub. No gallop.  Pulmonary:     Effort: Accessory muscle usage present. No respiratory distress.     Breath sounds: Normal air entry. No stridor. Examination of the left-upper field reveals rales. Examination of the right-middle field reveals decreased breath sounds. Examination of the left-middle field reveals rales. Examination of the right-lower field reveals decreased breath sounds.  Examination of the left-lower field reveals rales. Decreased breath sounds and rales present. No wheezing.  Chest:     Chest wall: No tenderness.  Abdominal:     General: Bowel sounds are normal. There is no distension.     Palpations: Abdomen is soft. There is no shifting dullness, fluid wave, mass or pulsatile mass.     Tenderness: There is no abdominal tenderness. There is no guarding or rebound.  Musculoskeletal:        General: No tenderness or deformity. Normal range of motion.     Cervical back: Normal range of motion and neck supple.  Lymphadenopathy:     Cervical: No cervical adenopathy.  Skin:    General: Skin is warm and dry.     Capillary Refill: Capillary refill takes less than 2 seconds.     Coloration: Skin is not pale.     Findings: No erythema or rash.  Neurological:     Mental Status: He is alert and oriented to person, place, and time.     Cranial Nerves: No cranial nerve deficit.     Motor: No abnormal muscle tone.     Coordination: Coordination normal.     Gait: Gait normal.     Deep Tendon Reflexes: Reflexes are normal and symmetric.  Psychiatric:        Mood and Affect: Mood and affect normal.        Behavior: Behavior normal. Behavior is cooperative.        Thought Content: Thought content normal.        Judgment: Judgment normal.        Assessment/Plan: 1. Encounter for subsequent annual wellness visit (AWV) in Medicare patient (Primary) Age-appropriate preventive screenings and vaccinations discussed, annual physical exam completed. Routine labs for health maintenance deferred, patient's specialists regularly get all of his labs checked. PHM updated.    2. COPD, severe (HCC) Continue supplemental oxygen use and medications as prescribed. Sees pulmonologist  3. Lung transplant failure (HCC) No significant change in respiratory status. Still sees pulmonology and transplant specialists regularly.   4. Type 2 diabetes mellitus with other specified  complication, with long-term current use of insulin (HCC) Sees endocrinology at [facility]  5. Oxygen dependent Continue supplemental oxygen use as instructed.      General Counseling: aldair mennella understanding of the findings of todays visit and agrees with plan of treatment. I have discussed any further diagnostic evaluation that may be needed or ordered today. We also reviewed his medications today. he  has been encouraged to call the office with any questions or concerns that should arise related to todays visit.    No orders of the defined types were placed in this encounter.   No orders of the defined types were placed in this encounter.   Return in about 1 year (around 04/09/2024) for AWV, Zauria Dombek PCP and otherwise as needed. .   Total time spent:30 Minutes Time spent includes review of chart, medications, test results, and follow up plan with the patient.   [place] Controlled Substance Database was reviewed by me.  This patient was seen by Sallyanne Kuster, FNP-C in collaboration with Dr. Beverely Risen as a part of collaborative care agreement.  Mackenzy Eisenberg R. Tedd Sias, MSN, FNP-C Internal medicine

## 2023-04-13 DIAGNOSIS — D2260 Melanocytic nevi of unspecified upper limb, including shoulder: Secondary | ICD-10-CM | POA: Diagnosis not present

## 2023-04-13 DIAGNOSIS — B353 Tinea pedis: Secondary | ICD-10-CM | POA: Diagnosis not present

## 2023-04-13 DIAGNOSIS — Z85828 Personal history of other malignant neoplasm of skin: Secondary | ICD-10-CM | POA: Diagnosis not present

## 2023-04-13 DIAGNOSIS — D849 Immunodeficiency, unspecified: Secondary | ICD-10-CM | POA: Diagnosis not present

## 2023-04-13 DIAGNOSIS — L738 Other specified follicular disorders: Secondary | ICD-10-CM | POA: Diagnosis not present

## 2023-04-13 DIAGNOSIS — D485 Neoplasm of uncertain behavior of skin: Secondary | ICD-10-CM | POA: Diagnosis not present

## 2023-04-13 DIAGNOSIS — L570 Actinic keratosis: Secondary | ICD-10-CM | POA: Diagnosis not present

## 2023-04-13 DIAGNOSIS — L821 Other seborrheic keratosis: Secondary | ICD-10-CM | POA: Diagnosis not present

## 2023-04-13 DIAGNOSIS — D099 Carcinoma in situ, unspecified: Secondary | ICD-10-CM | POA: Diagnosis not present

## 2023-04-13 DIAGNOSIS — L578 Other skin changes due to chronic exposure to nonionizing radiation: Secondary | ICD-10-CM | POA: Diagnosis not present

## 2023-04-13 DIAGNOSIS — L304 Erythema intertrigo: Secondary | ICD-10-CM | POA: Diagnosis not present

## 2023-04-13 DIAGNOSIS — L814 Other melanin hyperpigmentation: Secondary | ICD-10-CM | POA: Diagnosis not present

## 2023-04-20 DIAGNOSIS — Z006 Encounter for examination for normal comparison and control in clinical research program: Secondary | ICD-10-CM | POA: Diagnosis not present

## 2023-04-20 DIAGNOSIS — J42 Unspecified chronic bronchitis: Secondary | ICD-10-CM | POA: Diagnosis not present

## 2023-04-20 DIAGNOSIS — T86810 Lung transplant rejection: Secondary | ICD-10-CM | POA: Diagnosis not present

## 2023-04-20 DIAGNOSIS — J4481 Bronchiolitis obliterans and bronchiolitis obliterans syndrome: Secondary | ICD-10-CM | POA: Diagnosis not present

## 2023-04-21 DIAGNOSIS — J4481 Bronchiolitis obliterans and bronchiolitis obliterans syndrome: Secondary | ICD-10-CM | POA: Diagnosis not present

## 2023-04-21 DIAGNOSIS — J42 Unspecified chronic bronchitis: Secondary | ICD-10-CM | POA: Diagnosis not present

## 2023-04-21 DIAGNOSIS — T86810 Lung transplant rejection: Secondary | ICD-10-CM | POA: Diagnosis not present

## 2023-04-21 DIAGNOSIS — Z006 Encounter for examination for normal comparison and control in clinical research program: Secondary | ICD-10-CM | POA: Diagnosis not present

## 2023-05-03 DIAGNOSIS — Z942 Lung transplant status: Secondary | ICD-10-CM | POA: Diagnosis not present

## 2023-05-03 DIAGNOSIS — N184 Chronic kidney disease, stage 4 (severe): Secondary | ICD-10-CM | POA: Diagnosis not present

## 2023-05-03 DIAGNOSIS — D849 Immunodeficiency, unspecified: Secondary | ICD-10-CM | POA: Diagnosis not present

## 2023-05-03 DIAGNOSIS — T86818 Other complications of lung transplant: Secondary | ICD-10-CM | POA: Diagnosis not present

## 2023-05-03 DIAGNOSIS — R918 Other nonspecific abnormal finding of lung field: Secondary | ICD-10-CM | POA: Diagnosis not present

## 2023-05-03 DIAGNOSIS — Z5181 Encounter for therapeutic drug level monitoring: Secondary | ICD-10-CM | POA: Diagnosis not present

## 2023-05-03 DIAGNOSIS — Z79899 Other long term (current) drug therapy: Secondary | ICD-10-CM | POA: Diagnosis not present

## 2023-05-03 DIAGNOSIS — Z23 Encounter for immunization: Secondary | ICD-10-CM | POA: Diagnosis not present

## 2023-05-11 DIAGNOSIS — J42 Unspecified chronic bronchitis: Secondary | ICD-10-CM | POA: Diagnosis not present

## 2023-05-11 DIAGNOSIS — Z006 Encounter for examination for normal comparison and control in clinical research program: Secondary | ICD-10-CM | POA: Diagnosis not present

## 2023-05-11 DIAGNOSIS — E876 Hypokalemia: Secondary | ICD-10-CM | POA: Diagnosis not present

## 2023-05-11 DIAGNOSIS — T86810 Lung transplant rejection: Secondary | ICD-10-CM | POA: Diagnosis not present

## 2023-05-11 DIAGNOSIS — N184 Chronic kidney disease, stage 4 (severe): Secondary | ICD-10-CM | POA: Diagnosis not present

## 2023-05-11 DIAGNOSIS — J4481 Bronchiolitis obliterans and bronchiolitis obliterans syndrome: Secondary | ICD-10-CM | POA: Diagnosis not present

## 2023-05-11 DIAGNOSIS — D631 Anemia in chronic kidney disease: Secondary | ICD-10-CM | POA: Diagnosis not present

## 2023-05-12 DIAGNOSIS — J42 Unspecified chronic bronchitis: Secondary | ICD-10-CM | POA: Diagnosis not present

## 2023-05-12 DIAGNOSIS — T86810 Lung transplant rejection: Secondary | ICD-10-CM | POA: Diagnosis not present

## 2023-05-12 DIAGNOSIS — Z006 Encounter for examination for normal comparison and control in clinical research program: Secondary | ICD-10-CM | POA: Diagnosis not present

## 2023-05-12 DIAGNOSIS — J4481 Bronchiolitis obliterans and bronchiolitis obliterans syndrome: Secondary | ICD-10-CM | POA: Diagnosis not present

## 2023-06-01 DIAGNOSIS — G4733 Obstructive sleep apnea (adult) (pediatric): Secondary | ICD-10-CM | POA: Insufficient documentation

## 2023-06-01 NOTE — Progress Notes (Signed)
 Name: Caleb [name] DOB: Jul 14, 1945 MRN: [id number]  History of Present Illness: Caleb [name] is a 78 y.o. male who presents today for new patient appointment at [facility] [place]. He is accompanied by his wife Caleb [name]. - GU history: 1. BPH with LUTS (weak stream, incomplete bladder emptying, frequency). 2. Elevated PSA (resolved). - Family history of prostate cancer (brother). - PSA values: 04/28/2014: 1.42 10/01/2018: 6.22 03/29/2022: 1.0  Per chart review:  > 03/29/2022:  - Seen by Dr. Penne in [place] office. - PSA was normal (1.0).  > 04/06/2022:  - Renal US  showed non-specific diffuse bladder wall irregularity.  - Per Dr. Penne: Structure of kidneys looks fine.  Prostate is enlarged and your bladder looks like it has been working hard for many years to compensate for this.  > 04/12/2022:  - Seen at [facility].  - PVR = 556 ml.  - The patient is very deconditioned and is not a surgical candidate due to his medical comorbidities. - Foley catheter placed.   Today: He is transferring care from prior urology office. Confirms that he has used an indwelling catheter ever since last visit at [facility] in November 2023. He reports that he exchanges his indwelling Foley catheter himself at home without difficulty; reports that it will be due for exchange tomorrow. He reports adequate supply of 16 fr coude catheters at home, which he has been purchasing out of pocket. He reports the catheter is draining well with occasional hematuria. Reports that his catheter bag is purple, which is new in the past few weeks. Denies fevers, flank pain, abdominal pain, nausea, or vomiting. He is taking Flomax  0.4 mg daily. He reports the when he attempts to void volitionally between catheter exchanges he can only get a little bit out with a weak dribbling urinary stream. He reports that over the past few months he has started to notice a small amount of urine leakage  around the indwelling Foley catheter.    Medications: Current Outpatient Medications  Medication Sig Dispense Refill   acetaminophen  (TYLENOL ) 325 MG tablet Take 650 mg by mouth every 6 (six) hours as needed.     albuterol  (VENTOLIN  HFA) 108 (90 Base) MCG/ACT inhaler Inhale 2 puffs into the lungs every 6 (six) hours as needed for wheezing or shortness of breath.     apixaban (ELIQUIS) 2.5 MG TABS tablet Take 2.5 mg by mouth 2 (two) times daily.     azithromycin (ZITHROMAX) 250 MG tablet Take 250 mg by mouth 3 (three) times a week.     calcium acetate, Phos Binder, (PHOSLYRA) 667 MG/5ML SOLN Take 1,334 mg by mouth 3 (three) times daily with meals.     ferrous sulfate 324 MG TBEC Take 324 mg by mouth daily. With lunch     finasteride (PROSCAR) 5 MG tablet Take 5 mg by mouth daily.     FLUoxetine (PROZAC) 10 MG capsule Take 10 mg by mouth daily.     glucagon 1 MG injection For emergency use     Insulin  Syringe-Needle U-100 (BD VEO INSULIN  SYRINGE U/F) 31G X 15/64 0.3 ML MISC Use 1 Syringe 4 (four) times daily     ketoconazole (NIZORAL) 2 % cream Apply 1 Application topically 2 (two) times daily. foot     Magnesium 200 MG TABS Take by mouth. Take 1 tab po daily     mometasone -formoterol  (DULERA ) 200-5 MCG/ACT AERO Inhale into the lungs.     Multiple Vitamin (MULTI-VITAMIN) tablet Take 1  tablet by mouth daily.     mycophenolate  (CELLCEPT ) 250 MG capsule Take by mouth 2 (two) times daily.     OXYGEN  Inhale 2 L into the lungs. At night and with exertion     pantoprazole  (PROTONIX ) 40 MG tablet Take 40 mg by mouth 2 (two) times daily.      polyethylene glycol (MIRALAX  / GLYCOLAX ) 17 g packet Take 17 g by mouth daily.     predniSONE  (DELTASONE ) 5 MG tablet Take 5 mg by mouth daily with breakfast.     propranolol (INDERAL) 20 MG tablet Take 20 mg by mouth 2 (two) times daily.     rosuvastatin (CRESTOR) 10 MG tablet Take 10 mg by mouth daily.     senna-docusate (SENOKOT-S) 8.6-50 MG tablet Take 1  tablet by mouth 2 (two) times daily.     sulfamethoxazole -trimethoprim  (BACTRIM ) 400-80 MG tablet Take 1 tablet by mouth 3 (three) times a week. Mon, Wed, Fri     tacrolimus  (PROGRAF ) 0.5 MG capsule Take in combination with 1mg  capsules for total dose of 1mg  by mouth daily and 1.5mg  by mouth nightly.     tacrolimus  (PROGRAF ) 1 MG capsule Take 1 mg by mouth 2 (two) times daily.      tamsulosin  (FLOMAX ) 0.4 MG CAPS Take 0.4 mg by mouth daily after breakfast. 1 on saturdays     torsemide (DEMADEX) 20 MG tablet Take 60mg  by mouth twice daily x 3 days (9/3-9/5), then decrease to 40mg  by mouth twice daily.  Take additional 40mg  by mouth if weight gain increases by 2 lbs in 1 day or 5 lbs in 1 week.     valGANciclovir  (VALCYTE ) 450 MG tablet Take 450 mg by mouth every other day. Take every Monday and thursday     budesonide-formoterol  (SYMBICORT) 160-4.5 MCG/ACT inhaler Inhale 2 puffs into the lungs 2 (two) times daily.      Cholecalciferol  (VITAMIN D3) 125 MCG (5000 UT) TBDP Take by mouth.     insulin  lispro (HUMALOG) 100 UNIT/ML KwikPen Inject into the skin.     No current facility-administered medications for this visit.    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   [facility] - Right   Sleep apnea     Past Surgical History:  Procedure Laterality Date   BRONCHOSCOPY     multiple   CARDIAC DEFIBRILLATOR PLACEMENT  10/18/2019   [facility]   CATARACT EXTRACTION W/PHACO Left 04/11/2022   Procedure: CATARACT EXTRACTION PHACO AND INTRAOCULAR LENS PLACEMENT (IOC) LEFT DIABETIC;  Surgeon: Myrna Adine Anes, MD;  Location[facility];  Service: Ophthalmology;  Laterality: Left;  4.20 0:46.5   CATARACT EXTRACTION W/PHACO Right 04/25/2022   Procedure: CATARACT EXTRACTION PHACO AND INTRAOCULAR LENS PLACEMENT (IOC) RIGHT DIABETIC;  Surgeon: Myrna Adine Anes, MD;  Location: [facility];  Service: Ophthalmology;  Laterality: Right;  3.98 0:36.8   HIP FRACTURE SURGERY Right 12/2019   [facility]   LUNG TRANSPLANT     Family History  Problem Relation Age of Onset   Coronary artery disease Other    Diabetes Other    Social History   Socioeconomic History   Marital status: Married    Spouse name: Not on file   Number of children: Not on file   Years of education: Not on file   Highest education level: Not on file  Occupational History   Not on file  Tobacco Use   Smoking status: Former    Current packs/day: 0.00    Types: Cigarettes    Quit date: 05/16/1979    Years since quitting: 44.0   Smokeless tobacco: Never   Tobacco comments:    Quit 1980  Vaping Use   Vaping status: Never Used  Substance and Sexual Activity   Alcohol use: Not Currently   Drug use: No   Sexual activity: Not on file  Other Topics Concern   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    Social Drivers of Corporate Investment Banker Strain: Low Risk  (12/21/2022)   Received from [facility]   Overall Financial Resource Strain (CARDIA)    Difficulty of Paying Living Expenses: Not very hard  Food Insecurity: No Food Insecurity (12/21/2022)   Received from [facility]   Hunger Vital Sign    Worried About Running  Out of Food in the Last Year: Never true    Ran Out of Food in the Last Year: Never true  Transportation Needs: No Transportation Needs (12/21/2022)   Received from [facility] - Transportation    In the past 12 months, has lack of transportation kept you from medical appointments or from getting medications?: No    Lack of Transportation (Non-Medical): No  Physical Activity: Sufficiently Active (12/27/2020)   Received from [facility], [facility]   Exercise Vital Sign    Days of Exercise per Week: 3 days    Minutes of Exercise per Session: 90 min  Stress: No Stress Concern Present (12/27/2020)   Received from [facility], [facility]   [employer] of Occupational Health - Occupational Stress Questionnaire    Feeling of Stress : Not at all  Social Connections: Moderately Isolated (12/27/2020)   Received from [facility], [facility]   Social Connection and Isolation Panel [NHANES]    Frequency of Communication with Friends and Family: More than three times a week    Frequency of Social Gatherings with Friends and Family: Never    Attends Religious Services: Never    Diplomatic Services Operational Officer: No    Attends Engineer, Structural: Never    Marital Status: Married  Catering Manager Violence: Not on file    Review of Systems Constitutional: Patient denies any unintentional weight loss or change in strength lntegumentary: Patient denies any rashes or pruritus Cardiovascular: Patient denies chest pain or syncope Respiratory: Patient reports chronic shortness of breath  Gastrointestinal: Patient denies nausea, vomiting, constipation, or diarrhea Musculoskeletal: Patient denies muscle cramps or weakness Neurologic: Patient denies convulsions or seizures Allergic/Immunologic: Patient denies recent allergic  reaction(s) Hematologic/Lymphatic: Patient denies bleeding tendencies  Endocrine: Patient denies heat/cold intolerance  GU: As per HPI.  OBJECTIVE Vitals:   06/07/23 1007  BP: 105/72  Pulse: 83  Temp: (!) 97.5 F (36.4 C)   There is no height or weight on file to calculate BMI.  Physical Examination Constitutional: No obvious distress; patient is non-toxic appearing  Cardiovascular: No visible lower extremity edema; no pallor  Respiratory: The patient does not have audible wheezing/stridor; wearing oxygen  nasal cannula Gastrointestinal: Abdomen non-distended Musculoskeletal: Normal ROM of UEs  Skin: No obvious rashes/open sores  Neurologic: CN 2-12 grossly intact Psychiatric: Answered questions appropriately with normal affect  Hematologic/Lymphatic/Immunologic: No obvious bruises or sites of spontaneous bleeding  GU: Catheter bag is purple.    ASSESSMENT Benign prostatic hyperplasia with incomplete bladder emptying  Urine retention  Purple urine bag syndrome (HCC)  Urethral catheter present for long-term use  We discussed possible causes for his chronic urinary retention - most likely BPH and/or neurogenic bladder. He is a poor candidate for any form of sedation due to his significant medical comorbidities (particularly respiratory risks) and therefore is not a candidate for a bladder outlet procedure or SP tube placement.   We discussed that his current options to address chronic urinary retention include persistent use of an indwelling Foley catheter or clean intermittent catheterization (CIC) several times per day. We discussed that one disadvantage with long term use of an indwelling Foley catheter is the risk for erosion of the urethral sphincter, which may result in irreversible gross urinary incontinence over time.   He is comfortable self-cathing and ultimately elected to trial CIC 4-5x/day. He may use an indwelling Foley catheter on occasion as desired or if CIC  does not go well. Catheter order placed.   We discussed purple bag syndrome and how it occurs. Patient is asymptomatic for UTI today and therefore no antibiotic treatment is indicated as purple bag syndrome is attributed to asymptomatic bacteriuria from catheter use.   Pt was advised that their urine will always appear infected on UA due to catheter use and that we would not advise antibiotic treatment unless systemic symptoms are present (such as new onset or worsening of fever, rigors, altered mental status, malaise, or lethargy with no other identified cause; flank pain; CVA tenderness). We discussed antibiotic stewardship and goal to minimize risk for developing antibiotic resistance.  Will plan for follow up in 4 weeks or sooner if needed. Pt verbalized understanding and agreement. All questions were answered.  PLAN Advised the following: 1. Trial CIC 4-5x/day.  2. He may use an indwelling Foley catheter on occasion as desired or if CIC does not go well.  3. Continue Flomax  0.4 mg daily. 4. Return in about 4 weeks (around 07/05/2023) for f/u with Lauraine Oz, NP.  No orders of the defined types were placed in this encounter.  Total time spent caring for the patient today was over 45 minutes. This includes time spent on the date of the visit reviewing the patient's chart before the visit, time spent during the visit, and time spent after the visit on documentation. Over 50% of that time was spent in face-to-face time with this patient for direct counseling. E&M based on time and complexity of medical decision making.  It has been explained that the patient is to follow regularly with their PCP in addition to all other providers involved in their care and to follow instructions provided by these respective offices. Patient advised to contact urology clinic if any urologic-pertaining questions, concerns, new symptoms or  problems arise in the interim period.  There are no Patient Instructions  on file for this visit.  Electronically signed by:  Lauraine JAYSON Oz, FNP   06/07/23    12:45 PM

## 2023-06-07 ENCOUNTER — Ambulatory Visit: Payer: Medicare Other | Admitting: Urology

## 2023-06-07 ENCOUNTER — Encounter: Payer: Self-pay | Admitting: Urology

## 2023-06-07 VITALS — BP 105/72 | HR 83 | Temp 97.5°F

## 2023-06-07 DIAGNOSIS — N390 Urinary tract infection, site not specified: Secondary | ICD-10-CM | POA: Insufficient documentation

## 2023-06-07 DIAGNOSIS — Z960 Presence of urogenital implants: Secondary | ICD-10-CM | POA: Insufficient documentation

## 2023-06-07 DIAGNOSIS — T83518A Infection and inflammatory reaction due to other urinary catheter, initial encounter: Secondary | ICD-10-CM

## 2023-06-07 DIAGNOSIS — N401 Enlarged prostate with lower urinary tract symptoms: Secondary | ICD-10-CM

## 2023-06-07 DIAGNOSIS — R339 Retention of urine, unspecified: Secondary | ICD-10-CM

## 2023-06-08 DIAGNOSIS — J42 Unspecified chronic bronchitis: Secondary | ICD-10-CM | POA: Diagnosis not present

## 2023-06-08 DIAGNOSIS — R55 Syncope and collapse: Secondary | ICD-10-CM | POA: Diagnosis not present

## 2023-06-08 DIAGNOSIS — Z006 Encounter for examination for normal comparison and control in clinical research program: Secondary | ICD-10-CM | POA: Diagnosis not present

## 2023-06-08 DIAGNOSIS — J4481 Bronchiolitis obliterans and bronchiolitis obliterans syndrome: Secondary | ICD-10-CM | POA: Diagnosis not present

## 2023-06-08 DIAGNOSIS — T86810 Lung transplant rejection: Secondary | ICD-10-CM | POA: Diagnosis not present

## 2023-06-09 DIAGNOSIS — I4891 Unspecified atrial fibrillation: Secondary | ICD-10-CM | POA: Diagnosis not present

## 2023-06-09 DIAGNOSIS — Z006 Encounter for examination for normal comparison and control in clinical research program: Secondary | ICD-10-CM | POA: Diagnosis not present

## 2023-06-09 DIAGNOSIS — T86810 Lung transplant rejection: Secondary | ICD-10-CM | POA: Diagnosis not present

## 2023-06-09 DIAGNOSIS — Z95810 Presence of automatic (implantable) cardiac defibrillator: Secondary | ICD-10-CM | POA: Diagnosis not present

## 2023-06-09 DIAGNOSIS — J42 Unspecified chronic bronchitis: Secondary | ICD-10-CM | POA: Diagnosis not present

## 2023-06-09 DIAGNOSIS — J4481 Bronchiolitis obliterans and bronchiolitis obliterans syndrome: Secondary | ICD-10-CM | POA: Diagnosis not present

## 2023-06-15 ENCOUNTER — Encounter: Payer: Self-pay | Admitting: Physician Assistant

## 2023-06-15 ENCOUNTER — Ambulatory Visit (INDEPENDENT_AMBULATORY_CARE_PROVIDER_SITE_OTHER): Payer: Medicare Other | Admitting: Physician Assistant

## 2023-06-15 VITALS — BP 128/57 | HR 83 | Temp 97.8°F | Resp 16 | Ht 69.0 in | Wt 200.2 lb

## 2023-06-15 DIAGNOSIS — K5900 Constipation, unspecified: Secondary | ICD-10-CM | POA: Diagnosis not present

## 2023-06-15 DIAGNOSIS — K921 Melena: Secondary | ICD-10-CM

## 2023-06-15 DIAGNOSIS — J441 Chronic obstructive pulmonary disease with (acute) exacerbation: Secondary | ICD-10-CM

## 2023-06-15 MED ORDER — AMOXICILLIN-POT CLAVULANATE 875-125 MG PO TABS: 1.0000 | ORAL_TABLET | Freq: Two times a day (BID) | ORAL | 0 refills | Status: DC

## 2023-06-15 MED ORDER — PREDNISONE 10 MG PO TABS: ORAL_TABLET | ORAL | 0 refills | Status: DC

## 2023-06-15 NOTE — Progress Notes (Signed)
[facility] [address] [place], [place] [address]  Internal MEDICINE  Office Visit Note  Patient Name: Caleb [name]  [id number]  [id number]  Date of Service: 06/15/2023  Chief Complaint  Patient presents with   Acute Visit   Constipation     HPI Pt is here for a sick visit. -Reports feeling like he has a head cold, no fever, but has a productive coughing, lack of appetite/nausea for the past week or so. Reports he takes zpak 3 days per week normally and has done ok with augmentin previously -Some bloating and constipation and requests GI referral -Some black stools for the last week -does take iron supplement, but was taking this for awhile prior to dark stools. Denies taking any pepto bismol -does have some dizziness this past week as well with some fatigue -taking miralax occasionally, took senna to help constipation -drinking water, but limits how much due to kidney disease -Has lung transplant and has labs monitored closely. Anemic at baseline and hemoglobin stable for him as of last week -Discussed need for updated labs to check hemoglobin for possible GI bleed as cause of dark stools. Discussed blood thinner increases his risk -advised to go to ED if any new or worsening symptoms  Current Medication:  Outpatient Encounter Medications as of 06/15/2023  Medication Sig Note   acetaminophen (TYLENOL) 325 MG tablet Take 650 mg by mouth every 6 (six) hours as needed.    albuterol (VENTOLIN HFA) 108 (90 Base) MCG/ACT inhaler Inhale 2 puffs into the lungs every 6 (six) hours as needed for wheezing or shortness of breath.    amoxicillin-clavulanate (AUGMENTIN) 875-125 MG tablet Take 1 tablet by mouth 2 (two) times daily. Take with food.    apixaban (ELIQUIS) 2.5 MG TABS tablet Take 2.5 mg by mouth 2 (two) times daily.    azithromycin (ZITHROMAX) 250 MG tablet Take 250 mg by mouth 3 (three) times a week.    calcium acetate, Phos Binder, (PHOSLYRA) 667 MG/5ML  SOLN Take 1,334 mg by mouth 3 (three) times daily with meals.    ferrous sulfate 324 MG TBEC Take 324 mg by mouth daily. With lunch    finasteride (PROSCAR) 5 MG tablet Take 5 mg by mouth daily.    FLUoxetine (PROZAC) 10 MG capsule Take 10 mg by mouth daily.    glucagon 1 MG injection For emergency use    Insulin Syringe-Needle U-100 (BD VEO INSULIN SYRINGE U/F) 31G X 15/64" 0.3 ML MISC Use 1 Syringe 4 (four) times daily    ketoconazole (NIZORAL) 2 % cream Apply 1 Application topically 2 (two) times daily. foot    Magnesium 200 MG TABS Take by mouth. Take 1 tab po daily    mometasone-formoterol (DULERA) 200-5 MCG/ACT AERO Inhale into the lungs.    Multiple Vitamin (MULTI-VITAMIN) tablet Take 1 tablet by mouth daily.    mycophenolate (CELLCEPT) 250 MG capsule Take by mouth 2 (two) times daily.    OXYGEN Inhale 2 L into the lungs. At night and with exertion    pantoprazole (PROTONIX) 40 MG tablet Take 40 mg by mouth 2 (two) times daily.     polyethylene glycol (MIRALAX / GLYCOLAX) 17 g packet Take 17 g by mouth daily.    predniSONE (DELTASONE) 10 MG tablet Take one tab 3 x day for 3 days, then take one tab 2 x a day for 3 days and then take one tab a day for 3 days for copd    predniSONE (  DELTASONE) 5 MG tablet Take 5 mg by mouth daily with breakfast.    propranolol (INDERAL) 20 MG tablet Take 20 mg by mouth 2 (two) times daily.    rosuvastatin (CRESTOR) 10 MG tablet Take 10 mg by mouth daily.    senna-docusate (SENOKOT-S) 8.6-50 MG tablet Take 1 tablet by mouth 2 (two) times daily.    sulfamethoxazole-trimethoprim (BACTRIM) 400-80 MG tablet Take 1 tablet by mouth 3 (three) times a week. Mon, Wed, Fri    tacrolimus (PROGRAF) 0.5 MG capsule Take in combination with 1mg  capsules for total dose of 1mg  by mouth daily and 1.5mg  by mouth nightly.    tacrolimus (PROGRAF) 1 MG capsule Take 1 mg by mouth 2 (two) times daily.  01/14/2020: Takes 1mg  PO BID   tamsulosin (FLOMAX) 0.4 MG CAPS Take 0.4 mg by  mouth daily after breakfast. 1 on saturdays    torsemide (DEMADEX) 20 MG tablet Take 60mg  by mouth twice daily x 3 days (9/3-9/5), then decrease to 40mg  by mouth twice daily.  Take additional 40mg  by mouth if weight gain increases by 2 lbs in 1 day or 5 lbs in 1 week.    valGANciclovir (VALCYTE) 450 MG tablet Take 450 mg by mouth every other day. Take every Monday and thursday 01/13/2020: Due today, takes in the evening every other day   budesonide-formoterol (SYMBICORT) 160-4.5 MCG/ACT inhaler Inhale 2 puffs into the lungs 2 (two) times daily.     Cholecalciferol (VITAMIN D3) 125 MCG (5000 UT) TBDP Take by mouth.    insulin lispro (HUMALOG) 100 UNIT/ML KwikPen Inject into the skin.    No facility-administered encounter medications on file as of 06/15/2023.      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 Negative for fever.  HENT:  Positive for congestion and postnasal drip. Negative for mouth sores.   Respiratory:  Positive for cough, shortness of breath and wheezing.   Cardiovascular:  Negative for chest pain.  Gastrointestinal:  Positive for abdominal distention, constipation and  nausea.       Dark stools  Genitourinary:  Negative for flank pain.  Psychiatric/Behavioral: Negative.      Physical Exam Constitutional:      General: He is not in acute distress.    Appearance: Normal appearance. He is ill-appearing.  HENT:     Head: Normocephalic and atraumatic.     Nose: Congestion present.  Eyes:     Pupils: Pupils are equal, round, and reactive to light.  Cardiovascular:  Rate and Rhythm: Normal rate and regular rhythm.     Heart sounds: Normal heart sounds. No murmur heard. Pulmonary:     Breath sounds: Wheezing and rales present.  Neurological:     Mental Status: He is alert and oriented to person, place, and time.  Psychiatric:        Mood and Affect: Mood normal.        Behavior: Behavior normal.       Assessment/Plan: 1. COPD with acute exacerbation (HCC) (Primary) Will start on augmentin and prednisone and continue oxygen and inhalers as prescribed. Go to ED if new or worsening symptoms - predniSONE (DELTASONE) 10 MG tablet; Take one tab 3 x day for 3 days, then take one tab 2 x a day for 3 days and then take one tab a day for 3 days for copd  Dispense: 18 tablet; Refill: 0 - amoxicillin-clavulanate (AUGMENTIN) 875-125 MG tablet; Take 1 tablet by mouth 2 (two) times daily. Take with food.  Dispense: 14 tablet; Refill: 0  2. Black tarry stools Discussed concern for possible GI bleed and will order stat CBC to monitor hgb. Advised to go to ED if any new or worsening symptoms. Will order urgent GI referral - CBC w/Diff/Platelet - Ambulatory referral to Gastroenterology  3. Constipation, unspecified constipation type - Ambulatory referral to Gastroenterology   General Counseling: amorion oberlin understanding of the findings of todays visit and agrees with plan of treatment. I have discussed any further diagnostic evaluation that may be needed or ordered today. We also reviewed his medications today. he has been encouraged to call the office  with any questions or concerns that should arise related to todays visit.    Counseling:    Orders Placed This Encounter  Procedures   CBC w/Diff/Platelet   Ambulatory referral to Gastroenterology    Meds ordered this encounter  Medications   predniSONE (DELTASONE) 10 MG tablet    Sig: Take one tab 3 x day for 3 days, then take one tab 2 x a day for 3 days and then take one tab a day for 3 days for copd    Dispense:  18 tablet    Refill:  0   amoxicillin-clavulanate (AUGMENTIN) 875-125 MG tablet    Sig: Take 1 tablet by mouth 2 (two) times daily. Take with food.    Dispense:  14 tablet    Refill:  0    Time spent:30 Minutes

## 2023-06-16 ENCOUNTER — Telehealth: Payer: Self-pay | Admitting: Physician Assistant

## 2023-06-16 NOTE — Telephone Encounter (Signed)
Urgent Gastroenterology  referral sent via Epic to [facility]

## 2023-06-17 ENCOUNTER — Encounter: Payer: Self-pay | Admitting: Nurse Practitioner

## 2023-06-17 DIAGNOSIS — Z992 Dependence on renal dialysis: Secondary | ICD-10-CM | POA: Diagnosis not present

## 2023-06-17 DIAGNOSIS — M25451 Effusion, right hip: Secondary | ICD-10-CM | POA: Diagnosis not present

## 2023-06-17 DIAGNOSIS — D84821 Immunodeficiency due to drugs: Secondary | ICD-10-CM | POA: Diagnosis not present

## 2023-06-17 DIAGNOSIS — I4892 Unspecified atrial flutter: Secondary | ICD-10-CM | POA: Diagnosis not present

## 2023-06-17 DIAGNOSIS — D8989 Other specified disorders involving the immune mechanism, not elsewhere classified: Secondary | ICD-10-CM | POA: Diagnosis not present

## 2023-06-17 DIAGNOSIS — I4891 Unspecified atrial fibrillation: Secondary | ICD-10-CM | POA: Diagnosis not present

## 2023-06-17 DIAGNOSIS — E1165 Type 2 diabetes mellitus with hyperglycemia: Secondary | ICD-10-CM | POA: Diagnosis not present

## 2023-06-17 DIAGNOSIS — J4481 Bronchiolitis obliterans and bronchiolitis obliterans syndrome: Secondary | ICD-10-CM | POA: Diagnosis not present

## 2023-06-17 DIAGNOSIS — R109 Unspecified abdominal pain: Secondary | ICD-10-CM | POA: Diagnosis not present

## 2023-06-17 DIAGNOSIS — J101 Influenza due to other identified influenza virus with other respiratory manifestations: Secondary | ICD-10-CM | POA: Diagnosis not present

## 2023-06-17 DIAGNOSIS — T86812 Lung transplant infection: Secondary | ICD-10-CM | POA: Diagnosis not present

## 2023-06-17 DIAGNOSIS — J122 Parainfluenza virus pneumonia: Secondary | ICD-10-CM | POA: Diagnosis not present

## 2023-06-17 DIAGNOSIS — E119 Type 2 diabetes mellitus without complications: Secondary | ICD-10-CM | POA: Diagnosis not present

## 2023-06-17 DIAGNOSIS — R918 Other nonspecific abnormal finding of lung field: Secondary | ICD-10-CM | POA: Diagnosis not present

## 2023-06-17 DIAGNOSIS — E1122 Type 2 diabetes mellitus with diabetic chronic kidney disease: Secondary | ICD-10-CM | POA: Diagnosis not present

## 2023-06-17 DIAGNOSIS — A439 Nocardiosis, unspecified: Secondary | ICD-10-CM | POA: Diagnosis not present

## 2023-06-17 DIAGNOSIS — A310 Pulmonary mycobacterial infection: Secondary | ICD-10-CM | POA: Diagnosis not present

## 2023-06-17 DIAGNOSIS — B449 Aspergillosis, unspecified: Secondary | ICD-10-CM | POA: Diagnosis not present

## 2023-06-17 DIAGNOSIS — B964 Proteus (mirabilis) (morganii) as the cause of diseases classified elsewhere: Secondary | ICD-10-CM | POA: Diagnosis not present

## 2023-06-17 DIAGNOSIS — E8770 Fluid overload, unspecified: Secondary | ICD-10-CM | POA: Diagnosis not present

## 2023-06-17 DIAGNOSIS — Z5181 Encounter for therapeutic drug level monitoring: Secondary | ICD-10-CM | POA: Diagnosis not present

## 2023-06-17 DIAGNOSIS — R0602 Shortness of breath: Secondary | ICD-10-CM | POA: Diagnosis not present

## 2023-06-17 DIAGNOSIS — R000 Tachycardia, unspecified: Secondary | ICD-10-CM | POA: Diagnosis not present

## 2023-06-17 DIAGNOSIS — D649 Anemia, unspecified: Secondary | ICD-10-CM | POA: Diagnosis not present

## 2023-06-17 DIAGNOSIS — Z95810 Presence of automatic (implantable) cardiac defibrillator: Secondary | ICD-10-CM | POA: Diagnosis not present

## 2023-06-17 DIAGNOSIS — I5023 Acute on chronic systolic (congestive) heart failure: Secondary | ICD-10-CM | POA: Diagnosis not present

## 2023-06-17 DIAGNOSIS — Z45018 Encounter for adjustment and management of other part of cardiac pacemaker: Secondary | ICD-10-CM | POA: Diagnosis not present

## 2023-06-17 DIAGNOSIS — J849 Interstitial pulmonary disease, unspecified: Secondary | ICD-10-CM | POA: Diagnosis not present

## 2023-06-17 DIAGNOSIS — J90 Pleural effusion, not elsewhere classified: Secondary | ICD-10-CM | POA: Diagnosis not present

## 2023-06-17 DIAGNOSIS — Z8616 Personal history of COVID-19: Secondary | ICD-10-CM | POA: Diagnosis not present

## 2023-06-17 DIAGNOSIS — R8290 Unspecified abnormal findings in urine: Secondary | ICD-10-CM | POA: Diagnosis not present

## 2023-06-17 DIAGNOSIS — G7249 Other inflammatory and immune myopathies, not elsewhere classified: Secondary | ICD-10-CM | POA: Diagnosis not present

## 2023-06-17 DIAGNOSIS — N400 Enlarged prostate without lower urinary tract symptoms: Secondary | ICD-10-CM | POA: Diagnosis not present

## 2023-06-17 DIAGNOSIS — J479 Bronchiectasis, uncomplicated: Secondary | ICD-10-CM | POA: Diagnosis not present

## 2023-06-17 DIAGNOSIS — I491 Atrial premature depolarization: Secondary | ICD-10-CM | POA: Diagnosis not present

## 2023-06-17 DIAGNOSIS — T86818 Other complications of lung transplant: Secondary | ICD-10-CM | POA: Diagnosis not present

## 2023-06-17 DIAGNOSIS — I9789 Other postprocedural complications and disorders of the circulatory system, not elsewhere classified: Secondary | ICD-10-CM | POA: Diagnosis not present

## 2023-06-17 DIAGNOSIS — R58 Hemorrhage, not elsewhere classified: Secondary | ICD-10-CM | POA: Diagnosis not present

## 2023-06-17 DIAGNOSIS — B348 Other viral infections of unspecified site: Secondary | ICD-10-CM | POA: Diagnosis not present

## 2023-06-17 DIAGNOSIS — N189 Chronic kidney disease, unspecified: Secondary | ICD-10-CM | POA: Diagnosis not present

## 2023-06-17 DIAGNOSIS — S7011XA Contusion of right thigh, initial encounter: Secondary | ICD-10-CM | POA: Diagnosis not present

## 2023-06-17 DIAGNOSIS — R29898 Other symptoms and signs involving the musculoskeletal system: Secondary | ICD-10-CM | POA: Diagnosis not present

## 2023-06-17 DIAGNOSIS — K922 Gastrointestinal hemorrhage, unspecified: Secondary | ICD-10-CM | POA: Diagnosis not present

## 2023-06-17 DIAGNOSIS — M1711 Unilateral primary osteoarthritis, right knee: Secondary | ICD-10-CM | POA: Diagnosis not present

## 2023-06-17 DIAGNOSIS — R069 Unspecified abnormalities of breathing: Secondary | ICD-10-CM | POA: Diagnosis not present

## 2023-06-17 DIAGNOSIS — R140 Abdominal distension (gaseous): Secondary | ICD-10-CM | POA: Diagnosis not present

## 2023-06-17 DIAGNOSIS — M7981 Nontraumatic hematoma of soft tissue: Secondary | ICD-10-CM | POA: Diagnosis not present

## 2023-06-17 DIAGNOSIS — J9620 Acute and chronic respiratory failure, unspecified whether with hypoxia or hypercapnia: Secondary | ICD-10-CM | POA: Diagnosis not present

## 2023-06-17 DIAGNOSIS — N185 Chronic kidney disease, stage 5: Secondary | ICD-10-CM | POA: Diagnosis not present

## 2023-06-17 DIAGNOSIS — D509 Iron deficiency anemia, unspecified: Secondary | ICD-10-CM | POA: Diagnosis not present

## 2023-06-17 DIAGNOSIS — E441 Mild protein-calorie malnutrition: Secondary | ICD-10-CM | POA: Diagnosis not present

## 2023-06-17 DIAGNOSIS — R9431 Abnormal electrocardiogram [ECG] [EKG]: Secondary | ICD-10-CM | POA: Diagnosis not present

## 2023-06-17 DIAGNOSIS — X58XXXA Exposure to other specified factors, initial encounter: Secondary | ICD-10-CM | POA: Diagnosis not present

## 2023-06-17 DIAGNOSIS — R9389 Abnormal findings on diagnostic imaging of other specified body structures: Secondary | ICD-10-CM | POA: Diagnosis not present

## 2023-06-17 DIAGNOSIS — Z66 Do not resuscitate: Secondary | ICD-10-CM | POA: Diagnosis not present

## 2023-06-17 DIAGNOSIS — S7011XS Contusion of right thigh, sequela: Secondary | ICD-10-CM | POA: Diagnosis not present

## 2023-06-17 DIAGNOSIS — H748X3 Other specified disorders of middle ear and mastoid, bilateral: Secondary | ICD-10-CM | POA: Diagnosis not present

## 2023-06-17 DIAGNOSIS — F32A Depression, unspecified: Secondary | ICD-10-CM | POA: Diagnosis not present

## 2023-06-17 DIAGNOSIS — K921 Melena: Secondary | ICD-10-CM | POA: Diagnosis not present

## 2023-06-17 DIAGNOSIS — N184 Chronic kidney disease, stage 4 (severe): Secondary | ICD-10-CM | POA: Diagnosis not present

## 2023-06-17 DIAGNOSIS — J4A9 Chronic lung allograft dysfunction, unspecified: Secondary | ICD-10-CM | POA: Diagnosis not present

## 2023-06-17 DIAGNOSIS — E785 Hyperlipidemia, unspecified: Secondary | ICD-10-CM | POA: Diagnosis not present

## 2023-06-17 DIAGNOSIS — I2699 Other pulmonary embolism without acute cor pulmonale: Secondary | ICD-10-CM | POA: Diagnosis not present

## 2023-06-17 DIAGNOSIS — A430 Pulmonary nocardiosis: Secondary | ICD-10-CM | POA: Diagnosis not present

## 2023-06-17 DIAGNOSIS — D500 Iron deficiency anemia secondary to blood loss (chronic): Secondary | ICD-10-CM | POA: Diagnosis not present

## 2023-06-17 DIAGNOSIS — Z7401 Bed confinement status: Secondary | ICD-10-CM | POA: Diagnosis not present

## 2023-06-17 DIAGNOSIS — D849 Immunodeficiency, unspecified: Secondary | ICD-10-CM | POA: Diagnosis not present

## 2023-06-17 DIAGNOSIS — R112 Nausea with vomiting, unspecified: Secondary | ICD-10-CM | POA: Diagnosis not present

## 2023-06-17 DIAGNOSIS — J449 Chronic obstructive pulmonary disease, unspecified: Secondary | ICD-10-CM | POA: Diagnosis not present

## 2023-06-17 DIAGNOSIS — Z4659 Encounter for fitting and adjustment of other gastrointestinal appliance and device: Secondary | ICD-10-CM | POA: Diagnosis not present

## 2023-06-17 DIAGNOSIS — I132 Hypertensive heart and chronic kidney disease with heart failure and with stage 5 chronic kidney disease, or end stage renal disease: Secondary | ICD-10-CM | POA: Diagnosis not present

## 2023-06-17 DIAGNOSIS — J189 Pneumonia, unspecified organism: Secondary | ICD-10-CM | POA: Diagnosis not present

## 2023-06-17 DIAGNOSIS — Z6834 Body mass index (BMI) 34.0-34.9, adult: Secondary | ICD-10-CM | POA: Diagnosis not present

## 2023-06-17 DIAGNOSIS — B4489 Other forms of aspergillosis: Secondary | ICD-10-CM | POA: Diagnosis not present

## 2023-06-17 DIAGNOSIS — I129 Hypertensive chronic kidney disease with stage 1 through stage 4 chronic kidney disease, or unspecified chronic kidney disease: Secondary | ICD-10-CM | POA: Diagnosis not present

## 2023-06-17 DIAGNOSIS — B484 Penicillosis: Secondary | ICD-10-CM | POA: Diagnosis not present

## 2023-06-17 DIAGNOSIS — Z942 Lung transplant status: Secondary | ICD-10-CM | POA: Diagnosis not present

## 2023-06-17 DIAGNOSIS — A319 Mycobacterial infection, unspecified: Secondary | ICD-10-CM | POA: Diagnosis not present

## 2023-06-17 DIAGNOSIS — R739 Hyperglycemia, unspecified: Secondary | ICD-10-CM | POA: Diagnosis not present

## 2023-06-17 DIAGNOSIS — I071 Rheumatic tricuspid insufficiency: Secondary | ICD-10-CM | POA: Diagnosis not present

## 2023-06-17 DIAGNOSIS — I428 Other cardiomyopathies: Secondary | ICD-10-CM | POA: Diagnosis not present

## 2023-06-17 DIAGNOSIS — D638 Anemia in other chronic diseases classified elsewhere: Secondary | ICD-10-CM | POA: Diagnosis not present

## 2023-06-17 DIAGNOSIS — I498 Other specified cardiac arrhythmias: Secondary | ICD-10-CM | POA: Diagnosis not present

## 2023-06-17 DIAGNOSIS — I493 Ventricular premature depolarization: Secondary | ICD-10-CM | POA: Diagnosis not present

## 2023-06-17 DIAGNOSIS — I5033 Acute on chronic diastolic (congestive) heart failure: Secondary | ICD-10-CM | POA: Diagnosis not present

## 2023-06-17 DIAGNOSIS — I5022 Chronic systolic (congestive) heart failure: Secondary | ICD-10-CM | POA: Diagnosis not present

## 2023-06-17 DIAGNOSIS — S8011XA Contusion of right lower leg, initial encounter: Secondary | ICD-10-CM | POA: Diagnosis not present

## 2023-06-17 DIAGNOSIS — J9621 Acute and chronic respiratory failure with hypoxia: Secondary | ICD-10-CM | POA: Diagnosis not present

## 2023-06-17 DIAGNOSIS — Z515 Encounter for palliative care: Secondary | ICD-10-CM | POA: Diagnosis not present

## 2023-06-17 DIAGNOSIS — G4733 Obstructive sleep apnea (adult) (pediatric): Secondary | ICD-10-CM | POA: Diagnosis not present

## 2023-06-17 DIAGNOSIS — I959 Hypotension, unspecified: Secondary | ICD-10-CM | POA: Diagnosis not present

## 2023-06-17 DIAGNOSIS — J690 Pneumonitis due to inhalation of food and vomit: Secondary | ICD-10-CM | POA: Diagnosis not present

## 2023-06-17 DIAGNOSIS — N179 Acute kidney failure, unspecified: Secondary | ICD-10-CM | POA: Diagnosis not present

## 2023-06-17 DIAGNOSIS — I130 Hypertensive heart and chronic kidney disease with heart failure and stage 1 through stage 4 chronic kidney disease, or unspecified chronic kidney disease: Secondary | ICD-10-CM | POA: Diagnosis not present

## 2023-06-17 DIAGNOSIS — J329 Chronic sinusitis, unspecified: Secondary | ICD-10-CM | POA: Diagnosis not present

## 2023-06-17 DIAGNOSIS — J9602 Acute respiratory failure with hypercapnia: Secondary | ICD-10-CM | POA: Diagnosis not present

## 2023-06-17 DIAGNOSIS — T86810 Lung transplant rejection: Secondary | ICD-10-CM | POA: Diagnosis not present

## 2023-06-17 DIAGNOSIS — I10 Essential (primary) hypertension: Secondary | ICD-10-CM | POA: Diagnosis not present

## 2023-06-17 DIAGNOSIS — N19 Unspecified kidney failure: Secondary | ICD-10-CM | POA: Diagnosis not present

## 2023-06-17 DIAGNOSIS — K219 Gastro-esophageal reflux disease without esophagitis: Secondary | ICD-10-CM | POA: Diagnosis not present

## 2023-06-17 DIAGNOSIS — B441 Other pulmonary aspergillosis: Secondary | ICD-10-CM | POA: Diagnosis not present

## 2023-06-17 DIAGNOSIS — N186 End stage renal disease: Secondary | ICD-10-CM | POA: Diagnosis not present

## 2023-06-17 DIAGNOSIS — K5909 Other constipation: Secondary | ICD-10-CM | POA: Diagnosis not present

## 2023-06-17 DIAGNOSIS — Z794 Long term (current) use of insulin: Secondary | ICD-10-CM | POA: Diagnosis not present

## 2023-06-17 DIAGNOSIS — Z452 Encounter for adjustment and management of vascular access device: Secondary | ICD-10-CM | POA: Diagnosis not present

## 2023-06-17 DIAGNOSIS — R110 Nausea: Secondary | ICD-10-CM | POA: Diagnosis not present

## 2023-06-18 DIAGNOSIS — D849 Immunodeficiency, unspecified: Secondary | ICD-10-CM | POA: Diagnosis not present

## 2023-06-18 DIAGNOSIS — N184 Chronic kidney disease, stage 4 (severe): Secondary | ICD-10-CM | POA: Diagnosis not present

## 2023-06-18 DIAGNOSIS — T86810 Lung transplant rejection: Secondary | ICD-10-CM | POA: Diagnosis not present

## 2023-06-18 DIAGNOSIS — I9789 Other postprocedural complications and disorders of the circulatory system, not elsewhere classified: Secondary | ICD-10-CM | POA: Diagnosis not present

## 2023-06-18 DIAGNOSIS — K921 Melena: Secondary | ICD-10-CM | POA: Diagnosis not present

## 2023-06-18 DIAGNOSIS — D638 Anemia in other chronic diseases classified elsewhere: Secondary | ICD-10-CM | POA: Diagnosis not present

## 2023-06-18 DIAGNOSIS — I4891 Unspecified atrial fibrillation: Secondary | ICD-10-CM | POA: Diagnosis not present

## 2023-06-18 DIAGNOSIS — J9621 Acute and chronic respiratory failure with hypoxia: Secondary | ICD-10-CM | POA: Diagnosis not present

## 2023-06-18 DIAGNOSIS — I5022 Chronic systolic (congestive) heart failure: Secondary | ICD-10-CM | POA: Diagnosis not present

## 2023-06-18 DIAGNOSIS — J189 Pneumonia, unspecified organism: Secondary | ICD-10-CM | POA: Diagnosis not present

## 2023-06-18 DIAGNOSIS — R0602 Shortness of breath: Secondary | ICD-10-CM | POA: Diagnosis not present

## 2023-06-18 DIAGNOSIS — Z942 Lung transplant status: Secondary | ICD-10-CM | POA: Diagnosis not present

## 2023-06-18 DIAGNOSIS — B348 Other viral infections of unspecified site: Secondary | ICD-10-CM | POA: Diagnosis not present

## 2023-06-18 DIAGNOSIS — K922 Gastrointestinal hemorrhage, unspecified: Secondary | ICD-10-CM | POA: Diagnosis not present

## 2023-06-19 DIAGNOSIS — R140 Abdominal distension (gaseous): Secondary | ICD-10-CM | POA: Diagnosis not present

## 2023-06-19 DIAGNOSIS — Z794 Long term (current) use of insulin: Secondary | ICD-10-CM | POA: Diagnosis not present

## 2023-06-19 DIAGNOSIS — R0602 Shortness of breath: Secondary | ICD-10-CM | POA: Diagnosis not present

## 2023-06-19 DIAGNOSIS — J189 Pneumonia, unspecified organism: Secondary | ICD-10-CM | POA: Diagnosis not present

## 2023-06-19 DIAGNOSIS — K922 Gastrointestinal hemorrhage, unspecified: Secondary | ICD-10-CM | POA: Diagnosis not present

## 2023-06-19 DIAGNOSIS — I5022 Chronic systolic (congestive) heart failure: Secondary | ICD-10-CM | POA: Diagnosis not present

## 2023-06-19 DIAGNOSIS — E119 Type 2 diabetes mellitus without complications: Secondary | ICD-10-CM | POA: Diagnosis not present

## 2023-06-19 DIAGNOSIS — D638 Anemia in other chronic diseases classified elsewhere: Secondary | ICD-10-CM | POA: Diagnosis not present

## 2023-06-19 DIAGNOSIS — I9789 Other postprocedural complications and disorders of the circulatory system, not elsewhere classified: Secondary | ICD-10-CM | POA: Diagnosis not present

## 2023-06-19 DIAGNOSIS — I4891 Unspecified atrial fibrillation: Secondary | ICD-10-CM | POA: Diagnosis not present

## 2023-06-19 DIAGNOSIS — D849 Immunodeficiency, unspecified: Secondary | ICD-10-CM | POA: Diagnosis not present

## 2023-06-19 DIAGNOSIS — Z942 Lung transplant status: Secondary | ICD-10-CM | POA: Diagnosis not present

## 2023-06-20 DIAGNOSIS — Z942 Lung transplant status: Secondary | ICD-10-CM | POA: Diagnosis not present

## 2023-06-20 DIAGNOSIS — I9789 Other postprocedural complications and disorders of the circulatory system, not elsewhere classified: Secondary | ICD-10-CM | POA: Diagnosis not present

## 2023-06-20 DIAGNOSIS — I129 Hypertensive chronic kidney disease with stage 1 through stage 4 chronic kidney disease, or unspecified chronic kidney disease: Secondary | ICD-10-CM | POA: Diagnosis not present

## 2023-06-20 DIAGNOSIS — E1122 Type 2 diabetes mellitus with diabetic chronic kidney disease: Secondary | ICD-10-CM | POA: Diagnosis not present

## 2023-06-20 DIAGNOSIS — E1165 Type 2 diabetes mellitus with hyperglycemia: Secondary | ICD-10-CM | POA: Diagnosis not present

## 2023-06-20 DIAGNOSIS — Z95810 Presence of automatic (implantable) cardiac defibrillator: Secondary | ICD-10-CM | POA: Diagnosis not present

## 2023-06-20 DIAGNOSIS — R918 Other nonspecific abnormal finding of lung field: Secondary | ICD-10-CM | POA: Diagnosis not present

## 2023-06-20 DIAGNOSIS — J189 Pneumonia, unspecified organism: Secondary | ICD-10-CM | POA: Diagnosis not present

## 2023-06-20 DIAGNOSIS — R0602 Shortness of breath: Secondary | ICD-10-CM | POA: Diagnosis not present

## 2023-06-20 DIAGNOSIS — I491 Atrial premature depolarization: Secondary | ICD-10-CM | POA: Diagnosis not present

## 2023-06-20 DIAGNOSIS — D638 Anemia in other chronic diseases classified elsewhere: Secondary | ICD-10-CM | POA: Diagnosis not present

## 2023-06-20 DIAGNOSIS — I5022 Chronic systolic (congestive) heart failure: Secondary | ICD-10-CM | POA: Diagnosis not present

## 2023-06-20 DIAGNOSIS — N184 Chronic kidney disease, stage 4 (severe): Secondary | ICD-10-CM | POA: Diagnosis not present

## 2023-06-20 DIAGNOSIS — I4892 Unspecified atrial flutter: Secondary | ICD-10-CM | POA: Diagnosis not present

## 2023-06-20 DIAGNOSIS — D849 Immunodeficiency, unspecified: Secondary | ICD-10-CM | POA: Diagnosis not present

## 2023-06-20 DIAGNOSIS — I4891 Unspecified atrial fibrillation: Secondary | ICD-10-CM | POA: Diagnosis not present

## 2023-06-20 DIAGNOSIS — K922 Gastrointestinal hemorrhage, unspecified: Secondary | ICD-10-CM | POA: Diagnosis not present

## 2023-06-21 DIAGNOSIS — D638 Anemia in other chronic diseases classified elsewhere: Secondary | ICD-10-CM | POA: Diagnosis not present

## 2023-06-21 DIAGNOSIS — I4891 Unspecified atrial fibrillation: Secondary | ICD-10-CM | POA: Diagnosis not present

## 2023-06-21 DIAGNOSIS — E1122 Type 2 diabetes mellitus with diabetic chronic kidney disease: Secondary | ICD-10-CM | POA: Diagnosis not present

## 2023-06-21 DIAGNOSIS — N184 Chronic kidney disease, stage 4 (severe): Secondary | ICD-10-CM | POA: Diagnosis not present

## 2023-06-21 DIAGNOSIS — I4892 Unspecified atrial flutter: Secondary | ICD-10-CM | POA: Diagnosis not present

## 2023-06-21 DIAGNOSIS — R0602 Shortness of breath: Secondary | ICD-10-CM | POA: Diagnosis not present

## 2023-06-21 DIAGNOSIS — I129 Hypertensive chronic kidney disease with stage 1 through stage 4 chronic kidney disease, or unspecified chronic kidney disease: Secondary | ICD-10-CM | POA: Diagnosis not present

## 2023-06-21 DIAGNOSIS — I9789 Other postprocedural complications and disorders of the circulatory system, not elsewhere classified: Secondary | ICD-10-CM | POA: Diagnosis not present

## 2023-06-21 DIAGNOSIS — Z942 Lung transplant status: Secondary | ICD-10-CM | POA: Diagnosis not present

## 2023-06-21 DIAGNOSIS — Z95810 Presence of automatic (implantable) cardiac defibrillator: Secondary | ICD-10-CM | POA: Diagnosis not present

## 2023-06-21 DIAGNOSIS — K922 Gastrointestinal hemorrhage, unspecified: Secondary | ICD-10-CM | POA: Diagnosis not present

## 2023-06-21 DIAGNOSIS — I493 Ventricular premature depolarization: Secondary | ICD-10-CM | POA: Diagnosis not present

## 2023-06-21 DIAGNOSIS — D849 Immunodeficiency, unspecified: Secondary | ICD-10-CM | POA: Diagnosis not present

## 2023-06-21 DIAGNOSIS — J189 Pneumonia, unspecified organism: Secondary | ICD-10-CM | POA: Diagnosis not present

## 2023-06-21 DIAGNOSIS — I5022 Chronic systolic (congestive) heart failure: Secondary | ICD-10-CM | POA: Diagnosis not present

## 2023-06-22 DIAGNOSIS — B449 Aspergillosis, unspecified: Secondary | ICD-10-CM | POA: Diagnosis not present

## 2023-06-22 DIAGNOSIS — K922 Gastrointestinal hemorrhage, unspecified: Secondary | ICD-10-CM | POA: Diagnosis not present

## 2023-06-22 DIAGNOSIS — Z942 Lung transplant status: Secondary | ICD-10-CM | POA: Diagnosis not present

## 2023-06-22 DIAGNOSIS — R9389 Abnormal findings on diagnostic imaging of other specified body structures: Secondary | ICD-10-CM | POA: Diagnosis not present

## 2023-06-22 DIAGNOSIS — I4891 Unspecified atrial fibrillation: Secondary | ICD-10-CM | POA: Diagnosis not present

## 2023-06-22 DIAGNOSIS — N400 Enlarged prostate without lower urinary tract symptoms: Secondary | ICD-10-CM | POA: Diagnosis not present

## 2023-06-22 DIAGNOSIS — J189 Pneumonia, unspecified organism: Secondary | ICD-10-CM | POA: Diagnosis not present

## 2023-06-22 DIAGNOSIS — D638 Anemia in other chronic diseases classified elsewhere: Secondary | ICD-10-CM | POA: Diagnosis not present

## 2023-06-22 DIAGNOSIS — J479 Bronchiectasis, uncomplicated: Secondary | ICD-10-CM | POA: Diagnosis not present

## 2023-06-22 DIAGNOSIS — B4489 Other forms of aspergillosis: Secondary | ICD-10-CM | POA: Diagnosis not present

## 2023-06-22 DIAGNOSIS — E1165 Type 2 diabetes mellitus with hyperglycemia: Secondary | ICD-10-CM | POA: Diagnosis not present

## 2023-06-22 DIAGNOSIS — I5022 Chronic systolic (congestive) heart failure: Secondary | ICD-10-CM | POA: Diagnosis not present

## 2023-06-22 DIAGNOSIS — R0602 Shortness of breath: Secondary | ICD-10-CM | POA: Diagnosis not present

## 2023-06-22 DIAGNOSIS — I9789 Other postprocedural complications and disorders of the circulatory system, not elsewhere classified: Secondary | ICD-10-CM | POA: Diagnosis not present

## 2023-06-22 DIAGNOSIS — D849 Immunodeficiency, unspecified: Secondary | ICD-10-CM | POA: Diagnosis not present

## 2023-06-22 DIAGNOSIS — J9621 Acute and chronic respiratory failure with hypoxia: Secondary | ICD-10-CM | POA: Diagnosis not present

## 2023-06-23 DIAGNOSIS — I9789 Other postprocedural complications and disorders of the circulatory system, not elsewhere classified: Secondary | ICD-10-CM | POA: Diagnosis not present

## 2023-06-23 DIAGNOSIS — K922 Gastrointestinal hemorrhage, unspecified: Secondary | ICD-10-CM | POA: Diagnosis not present

## 2023-06-23 DIAGNOSIS — R0602 Shortness of breath: Secondary | ICD-10-CM | POA: Diagnosis not present

## 2023-06-23 DIAGNOSIS — I5022 Chronic systolic (congestive) heart failure: Secondary | ICD-10-CM | POA: Diagnosis not present

## 2023-06-23 DIAGNOSIS — E1165 Type 2 diabetes mellitus with hyperglycemia: Secondary | ICD-10-CM | POA: Diagnosis not present

## 2023-06-23 DIAGNOSIS — J189 Pneumonia, unspecified organism: Secondary | ICD-10-CM | POA: Diagnosis not present

## 2023-06-23 DIAGNOSIS — D638 Anemia in other chronic diseases classified elsewhere: Secondary | ICD-10-CM | POA: Diagnosis not present

## 2023-06-23 DIAGNOSIS — N400 Enlarged prostate without lower urinary tract symptoms: Secondary | ICD-10-CM | POA: Diagnosis not present

## 2023-06-23 DIAGNOSIS — Z942 Lung transplant status: Secondary | ICD-10-CM | POA: Diagnosis not present

## 2023-06-23 DIAGNOSIS — D849 Immunodeficiency, unspecified: Secondary | ICD-10-CM | POA: Diagnosis not present

## 2023-06-23 DIAGNOSIS — J9621 Acute and chronic respiratory failure with hypoxia: Secondary | ICD-10-CM | POA: Diagnosis not present

## 2023-06-23 DIAGNOSIS — I4891 Unspecified atrial fibrillation: Secondary | ICD-10-CM | POA: Diagnosis not present

## 2023-06-23 DIAGNOSIS — B449 Aspergillosis, unspecified: Secondary | ICD-10-CM | POA: Diagnosis not present

## 2023-06-23 DIAGNOSIS — R9389 Abnormal findings on diagnostic imaging of other specified body structures: Secondary | ICD-10-CM | POA: Diagnosis not present

## 2023-06-24 DIAGNOSIS — J9621 Acute and chronic respiratory failure with hypoxia: Secondary | ICD-10-CM | POA: Diagnosis not present

## 2023-06-24 DIAGNOSIS — Z942 Lung transplant status: Secondary | ICD-10-CM | POA: Diagnosis not present

## 2023-06-24 DIAGNOSIS — J189 Pneumonia, unspecified organism: Secondary | ICD-10-CM | POA: Diagnosis not present

## 2023-06-24 DIAGNOSIS — R0602 Shortness of breath: Secondary | ICD-10-CM | POA: Diagnosis not present

## 2023-06-25 DIAGNOSIS — Z942 Lung transplant status: Secondary | ICD-10-CM | POA: Diagnosis not present

## 2023-06-25 DIAGNOSIS — R0602 Shortness of breath: Secondary | ICD-10-CM | POA: Diagnosis not present

## 2023-06-25 DIAGNOSIS — J189 Pneumonia, unspecified organism: Secondary | ICD-10-CM | POA: Diagnosis not present

## 2023-06-25 DIAGNOSIS — J9621 Acute and chronic respiratory failure with hypoxia: Secondary | ICD-10-CM | POA: Diagnosis not present

## 2023-06-26 DIAGNOSIS — Z942 Lung transplant status: Secondary | ICD-10-CM | POA: Diagnosis not present

## 2023-06-26 DIAGNOSIS — D649 Anemia, unspecified: Secondary | ICD-10-CM | POA: Diagnosis not present

## 2023-06-26 DIAGNOSIS — R0602 Shortness of breath: Secondary | ICD-10-CM | POA: Diagnosis not present

## 2023-06-26 DIAGNOSIS — R29898 Other symptoms and signs involving the musculoskeletal system: Secondary | ICD-10-CM | POA: Diagnosis not present

## 2023-06-26 DIAGNOSIS — N189 Chronic kidney disease, unspecified: Secondary | ICD-10-CM | POA: Diagnosis not present

## 2023-06-26 DIAGNOSIS — D849 Immunodeficiency, unspecified: Secondary | ICD-10-CM | POA: Diagnosis not present

## 2023-06-26 DIAGNOSIS — I5022 Chronic systolic (congestive) heart failure: Secondary | ICD-10-CM | POA: Diagnosis not present

## 2023-06-26 DIAGNOSIS — E1165 Type 2 diabetes mellitus with hyperglycemia: Secondary | ICD-10-CM | POA: Diagnosis not present

## 2023-06-26 DIAGNOSIS — J189 Pneumonia, unspecified organism: Secondary | ICD-10-CM | POA: Diagnosis not present

## 2023-06-26 DIAGNOSIS — I4891 Unspecified atrial fibrillation: Secondary | ICD-10-CM | POA: Diagnosis not present

## 2023-06-26 DIAGNOSIS — J4481 Bronchiolitis obliterans and bronchiolitis obliterans syndrome: Secondary | ICD-10-CM | POA: Diagnosis not present

## 2023-06-26 DIAGNOSIS — R9431 Abnormal electrocardiogram [ECG] [EKG]: Secondary | ICD-10-CM | POA: Diagnosis not present

## 2023-06-26 DIAGNOSIS — I9789 Other postprocedural complications and disorders of the circulatory system, not elsewhere classified: Secondary | ICD-10-CM | POA: Diagnosis not present

## 2023-06-27 DIAGNOSIS — D849 Immunodeficiency, unspecified: Secondary | ICD-10-CM | POA: Diagnosis not present

## 2023-06-27 DIAGNOSIS — J189 Pneumonia, unspecified organism: Secondary | ICD-10-CM | POA: Diagnosis not present

## 2023-06-27 DIAGNOSIS — N19 Unspecified kidney failure: Secondary | ICD-10-CM | POA: Diagnosis not present

## 2023-06-27 DIAGNOSIS — I9789 Other postprocedural complications and disorders of the circulatory system, not elsewhere classified: Secondary | ICD-10-CM | POA: Diagnosis not present

## 2023-06-27 DIAGNOSIS — R8290 Unspecified abnormal findings in urine: Secondary | ICD-10-CM | POA: Diagnosis not present

## 2023-06-27 DIAGNOSIS — I5022 Chronic systolic (congestive) heart failure: Secondary | ICD-10-CM | POA: Diagnosis not present

## 2023-06-27 DIAGNOSIS — A430 Pulmonary nocardiosis: Secondary | ICD-10-CM | POA: Diagnosis not present

## 2023-06-27 DIAGNOSIS — D649 Anemia, unspecified: Secondary | ICD-10-CM | POA: Diagnosis not present

## 2023-06-27 DIAGNOSIS — J9621 Acute and chronic respiratory failure with hypoxia: Secondary | ICD-10-CM | POA: Diagnosis not present

## 2023-06-27 DIAGNOSIS — I4891 Unspecified atrial fibrillation: Secondary | ICD-10-CM | POA: Diagnosis not present

## 2023-06-27 DIAGNOSIS — R0602 Shortness of breath: Secondary | ICD-10-CM | POA: Diagnosis not present

## 2023-06-27 DIAGNOSIS — E8770 Fluid overload, unspecified: Secondary | ICD-10-CM | POA: Diagnosis not present

## 2023-06-27 DIAGNOSIS — N185 Chronic kidney disease, stage 5: Secondary | ICD-10-CM | POA: Diagnosis not present

## 2023-06-27 DIAGNOSIS — E1165 Type 2 diabetes mellitus with hyperglycemia: Secondary | ICD-10-CM | POA: Diagnosis not present

## 2023-06-27 DIAGNOSIS — Z942 Lung transplant status: Secondary | ICD-10-CM | POA: Diagnosis not present

## 2023-06-27 DIAGNOSIS — B449 Aspergillosis, unspecified: Secondary | ICD-10-CM | POA: Diagnosis not present

## 2023-06-27 NOTE — Progress Notes (Deleted)
 Name: Caleb [name] DOB: 04-21-1946 MRN: [id number]  History of Present Illness: Mr. Hannan is a 78 y.o. male who presents today for follow up visit at [facility] [place].  ***He is accompanied by ***. - GU history: 1. BPH with LUTS (weak stream, incomplete bladder emptying, frequency). 2. Chronic urinary retention.  - Undetermined etiology (BPH versus neurogenic bladder). - He is a poor candidate for any form of sedation due to his significant medical comorbidities (particularly respiratory risks) and therefore is not a candidate for a bladder outlet procedure or SP tube placement.  3. Elevated PSA (resolved). - Family history of prostate cancer (brother). - PSA values: 04/28/2014: 1.42 10/01/2018: 6.22 03/29/2022: 1.0  At initial visit on 06/07/2023: - Educated about purple bag syndrome and asymptomatic bacteriuria from catheter use.  - The plan was: 1. Trial CIC 4-5x/day as alternative to indwelling Foley catheter to minimize risk for urethral erosion. 2. He may use an indwelling Foley catheter on occasion as desired or if CIC does not go well. He has previously exchanged his indwelling Foley catheter himself at home multiple times without difficulty.  3. Continue Flomax  0.4 mg daily. 4. Return in about 4 weeks (around 07/05/2023) for f/u with Lauraine Oz, NP.  Since last visit: ***  Today: He reports ***  He reports performing self-catheterization *** times per day with***out difficulty.  He reports the catheter is draining ***.  It was last exchanged ***.  He {Actions; denies-reports:120008} gross hematuria.  He {Actions; denies-reports:120008} flank pain or abdominal pain. He {Actions; denies-reports:120008} fevers, nausea, or vomiting.   Fall Screening: Do you usually have a device to assist in your mobility? {yes/no:20286} ***cane / ***walker / ***wheelchair   Medications: Current Outpatient Medications  Medication Sig Dispense Refill    acetaminophen  (TYLENOL ) 325 MG tablet Take 650 mg by mouth every 6 (six) hours as needed.     albuterol  (VENTOLIN  HFA) 108 (90 Base) MCG/ACT inhaler Inhale 2 puffs into the lungs every 6 (six) hours as needed for wheezing or shortness of breath.     amoxicillin -clavulanate (AUGMENTIN ) 875-125 MG tablet Take 1 tablet by mouth 2 (two) times daily. Take with food. 14 tablet 0   apixaban (ELIQUIS) 2.5 MG TABS tablet Take 2.5 mg by mouth 2 (two) times daily.     azithromycin (ZITHROMAX) 250 MG tablet Take 250 mg by mouth 3 (three) times a week.     budesonide-formoterol  (SYMBICORT) 160-4.5 MCG/ACT inhaler Inhale 2 puffs into the lungs 2 (two) times daily.      calcium acetate, Phos Binder, (PHOSLYRA) 667 MG/5ML SOLN Take 1,334 mg by mouth 3 (three) times daily with meals.     ferrous sulfate 324 MG TBEC Take 324 mg by mouth daily. With lunch     finasteride (PROSCAR) 5 MG tablet Take 5 mg by mouth daily.     FLUoxetine (PROZAC) 10 MG capsule Take 10 mg by mouth daily.     glucagon 1 MG injection For emergency use     insulin  lispro (HUMALOG) 100 UNIT/ML KwikPen Inject into the skin.     Insulin  Syringe-Needle U-100 (BD VEO INSULIN  SYRINGE U/F) 31G X 15/64 0.3 ML MISC Use 1 Syringe 4 (four) times daily     ketoconazole (NIZORAL) 2 % cream Apply 1 Application topically 2 (two) times daily. foot     Magnesium 200 MG TABS Take by mouth. Take 1 tab po daily     mometasone -formoterol  (DULERA ) 200-5 MCG/ACT AERO Inhale into the lungs.  Multiple Vitamin (MULTI-VITAMIN) tablet Take 1 tablet by mouth daily.     mycophenolate  (CELLCEPT ) 250 MG capsule Take by mouth 2 (two) times daily.     OXYGEN  Inhale 2 L into the lungs. At night and with exertion     pantoprazole  (PROTONIX ) 40 MG tablet Take 40 mg by mouth 2 (two) times daily.      polyethylene glycol (MIRALAX  / GLYCOLAX ) 17 g packet Take 17 g by mouth daily.     predniSONE  (DELTASONE ) 10 MG tablet Take one tab 3 x day for 3 days, then take one tab 2 x a  day for 3 days and then take one tab a day for 3 days for copd 18 tablet 0   predniSONE  (DELTASONE ) 5 MG tablet Take 5 mg by mouth daily with breakfast.     propranolol (INDERAL) 20 MG tablet Take 20 mg by mouth 2 (two) times daily.     rosuvastatin (CRESTOR) 10 MG tablet Take 10 mg by mouth daily.     senna-docusate (SENOKOT-S) 8.6-50 MG tablet Take 1 tablet by mouth 2 (two) times daily.     sulfamethoxazole -trimethoprim  (BACTRIM ) 400-80 MG tablet Take 1 tablet by mouth 3 (three) times a week. Mon, Wed, Fri     tacrolimus  (PROGRAF ) 0.5 MG capsule Take in combination with 1mg  capsules for total dose of 1mg  by mouth daily and 1.5mg  by mouth nightly.     tacrolimus  (PROGRAF ) 1 MG capsule Take 1 mg by mouth 2 (two) times daily.      tamsulosin  (FLOMAX ) 0.4 MG CAPS Take 0.4 mg by mouth daily after breakfast. 1 on saturdays     torsemide (DEMADEX) 20 MG tablet Take 60mg  by mouth twice daily x 3 days (9/3-9/5), then decrease to 40mg  by mouth twice daily.  Take additional 40mg  by mouth if weight gain increases by 2 lbs in 1 day or 5 lbs in 1 week.     valGANciclovir  (VALCYTE ) 450 MG tablet Take 450 mg by mouth every other day. Take every Monday and thursday     No current facility-administered medications for this visit.    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   [facility] - Right   Sleep apnea    Past Surgical History:  Procedure Laterality Date   BRONCHOSCOPY     multiple   CARDIAC DEFIBRILLATOR PLACEMENT  10/18/2019   [facility]   CATARACT EXTRACTION W/PHACO Left 04/11/2022   Procedure: CATARACT EXTRACTION PHACO AND INTRAOCULAR LENS PLACEMENT (IOC) LEFT DIABETIC;  Surgeon: Myrna Adine Anes, MD;  Location: [facility];  Service: Ophthalmology;  Laterality: Left;  4.20 0:46.5   CATARACT EXTRACTION W/PHACO Right 04/25/2022   Procedure: CATARACT EXTRACTION PHACO AND INTRAOCULAR LENS PLACEMENT (IOC) RIGHT DIABETIC;  Surgeon:  Myrna Adine Anes, MD;  Location: [facility];  Service: Ophthalmology;  Laterality: Right;  3.98 0:36.8   HIP FRACTURE SURGERY Right 12/2019   [facility]   LUNG TRANSPLANT     Family History  Problem Relation Age of Onset   Coronary artery disease Other    Diabetes Other    Social History   Socioeconomic History   Marital status: Married    Spouse name: Not on file   Number of children: Not on file   Years of education: Not on file   Highest education level: Not on file  Occupational History   Not on file  Tobacco Use   Smoking status: Former    Current packs/day: 0.00    Types: Cigarettes    Quit date: 05/16/1979    Years since quitting: 44.1   Smokeless tobacco: Never   Tobacco comments:    Quit 1980  Vaping Use   Vaping status: Never Used  Substance and Sexual Activity   Alcohol use: Not Currently   Drug use: No   Sexual activity: Not on file  Other Topics Concern   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    Social Drivers of Corporate Investment Banker Strain: Low Risk  (06/17/2023)   Received from [facility]   Overall Financial Resource Strain (CARDIA)    Difficulty of Paying Living Expenses: Not hard at all  Food Insecurity: No Food Insecurity  (06/17/2023)   Received from [facility]   Hunger Vital Sign    Worried About Running Out of Food in the Last Year: Never true    Ran Out of Food in the Last Year: Never true  Transportation Needs: No Transportation Needs (06/18/2023)   Received from [facility] - Transportation    In the past 12 months, has lack of transportation kept you from medical appointments or from getting medications?: No    Lack of Transportation (Non-Medical): No  Physical Activity: Sufficiently Active (12/27/2020)   Received from [facility], [facility]   Exercise Vital Sign    Days of Exercise per Week: 3 days    Minutes of Exercise per Session: 90 min  Stress: No Stress Concern Present (12/27/2020)   Received from [facility], [facility]   [employer] of Occupational Health - Occupational Stress Questionnaire    Feeling of Stress : Not at all  Social Connections: Moderately Isolated (12/27/2020)   Received from [facility], [facility]   Social Connection and Isolation Panel [NHANES]    Frequency of Communication with Friends and Family: More than three times a week    Frequency of Social Gatherings with Friends and Family: Never    Attends Religious Services: Never    Diplomatic Services Operational Officer: No    Attends Engineer, Structural: Never    Marital Status: Married  Catering Manager Violence: Not on file    Review of Systems Constitutional: Patient denies any unintentional weight loss or change in strength lntegumentary: Patient denies any rashes or pruritus Cardiovascular: Patient denies chest pain or syncope Respiratory: Patient denies shortness of breath Gastrointestinal: Patient ***denies nausea, vomiting, constipation, or diarrhea Musculoskeletal: Patient denies muscle cramps or weakness Neurologic: Patient denies  convulsions or seizures Allergic/Immunologic: Patient denies recent allergic reaction(s) Hematologic/Lymphatic: Patient denies bleeding tendencies Endocrine: Patient  denies heat/cold intolerance  GU: As per HPI.  OBJECTIVE There were no vitals filed for this visit. There is no height or weight on file to calculate BMI.  Physical Examination Constitutional: No obvious distress; patient is non-toxic appearing  Cardiovascular: No visible lower extremity edema.  Respiratory: The patient does not have audible wheezing/stridor; respirations do not appear labored  Gastrointestinal: Abdomen non-distended Musculoskeletal: Normal ROM of UEs  Skin: No obvious rashes/open sores  Neurologic: CN 2-12 grossly intact Psychiatric: Answered questions appropriately with normal affect  Hematologic/Lymphatic/Immunologic: No obvious bruises or sites of spontaneous bleeding  UA: ***negative / *** WBC/hpf, *** RBC/hpf, *** bacteria ***Urine microscopy: ***negative / *** WBC/hpf, *** RBC/hpf, *** bacteria ***with no evidence of UTI ***with no evidence of microscopic hematuria ***otherwise unremarkable  PVR: *** ml  ASSESSMENT No diagnosis found. ***  We agreed to plan for follow up in *** months / ***1 year or sooner if needed. Patient verbalized understanding of and agreement with current plan. All questions were answered.  PLAN Advised the following: 1. *** 2. ***No follow-ups on file.  No orders of the defined types were placed in this encounter.   It has been explained that the patient is to follow regularly with their PCP in addition to all other providers involved in their care and to follow instructions provided by these respective offices. Patient advised to contact urology clinic if any urologic-pertaining questions, concerns, new symptoms or problems arise in the interim period.  There are no Patient Instructions on file for this visit.  Electronically signed by:  Lauraine JAYSON Oz,  FNP   06/27/23    1:16 PM

## 2023-06-28 DIAGNOSIS — R8290 Unspecified abnormal findings in urine: Secondary | ICD-10-CM | POA: Diagnosis not present

## 2023-06-28 DIAGNOSIS — N185 Chronic kidney disease, stage 5: Secondary | ICD-10-CM | POA: Diagnosis not present

## 2023-06-28 DIAGNOSIS — N19 Unspecified kidney failure: Secondary | ICD-10-CM | POA: Diagnosis not present

## 2023-06-28 DIAGNOSIS — J9621 Acute and chronic respiratory failure with hypoxia: Secondary | ICD-10-CM | POA: Diagnosis not present

## 2023-06-28 DIAGNOSIS — R0602 Shortness of breath: Secondary | ICD-10-CM | POA: Diagnosis not present

## 2023-06-28 DIAGNOSIS — D649 Anemia, unspecified: Secondary | ICD-10-CM | POA: Diagnosis not present

## 2023-06-28 DIAGNOSIS — E8770 Fluid overload, unspecified: Secondary | ICD-10-CM | POA: Diagnosis not present

## 2023-06-28 DIAGNOSIS — E1165 Type 2 diabetes mellitus with hyperglycemia: Secondary | ICD-10-CM | POA: Diagnosis not present

## 2023-06-28 DIAGNOSIS — I9789 Other postprocedural complications and disorders of the circulatory system, not elsewhere classified: Secondary | ICD-10-CM | POA: Diagnosis not present

## 2023-06-28 DIAGNOSIS — D849 Immunodeficiency, unspecified: Secondary | ICD-10-CM | POA: Diagnosis not present

## 2023-06-28 DIAGNOSIS — I4891 Unspecified atrial fibrillation: Secondary | ICD-10-CM | POA: Diagnosis not present

## 2023-06-28 DIAGNOSIS — I5022 Chronic systolic (congestive) heart failure: Secondary | ICD-10-CM | POA: Diagnosis not present

## 2023-06-28 DIAGNOSIS — Z942 Lung transplant status: Secondary | ICD-10-CM | POA: Diagnosis not present

## 2023-06-28 DIAGNOSIS — A430 Pulmonary nocardiosis: Secondary | ICD-10-CM | POA: Diagnosis not present

## 2023-06-28 DIAGNOSIS — J189 Pneumonia, unspecified organism: Secondary | ICD-10-CM | POA: Diagnosis not present

## 2023-06-28 DIAGNOSIS — B449 Aspergillosis, unspecified: Secondary | ICD-10-CM | POA: Diagnosis not present

## 2023-06-29 DIAGNOSIS — S7011XA Contusion of right thigh, initial encounter: Secondary | ICD-10-CM | POA: Diagnosis not present

## 2023-06-29 DIAGNOSIS — Z452 Encounter for adjustment and management of vascular access device: Secondary | ICD-10-CM | POA: Diagnosis not present

## 2023-06-29 DIAGNOSIS — J9621 Acute and chronic respiratory failure with hypoxia: Secondary | ICD-10-CM | POA: Diagnosis not present

## 2023-06-29 DIAGNOSIS — A439 Nocardiosis, unspecified: Secondary | ICD-10-CM | POA: Diagnosis not present

## 2023-06-29 DIAGNOSIS — J4481 Bronchiolitis obliterans and bronchiolitis obliterans syndrome: Secondary | ICD-10-CM | POA: Diagnosis not present

## 2023-06-29 DIAGNOSIS — D849 Immunodeficiency, unspecified: Secondary | ICD-10-CM | POA: Diagnosis not present

## 2023-06-29 DIAGNOSIS — I4891 Unspecified atrial fibrillation: Secondary | ICD-10-CM | POA: Diagnosis not present

## 2023-06-29 DIAGNOSIS — T86810 Lung transplant rejection: Secondary | ICD-10-CM | POA: Diagnosis not present

## 2023-06-29 DIAGNOSIS — N184 Chronic kidney disease, stage 4 (severe): Secondary | ICD-10-CM | POA: Diagnosis not present

## 2023-06-29 DIAGNOSIS — B449 Aspergillosis, unspecified: Secondary | ICD-10-CM | POA: Diagnosis not present

## 2023-06-29 DIAGNOSIS — A430 Pulmonary nocardiosis: Secondary | ICD-10-CM | POA: Diagnosis not present

## 2023-06-29 DIAGNOSIS — I5022 Chronic systolic (congestive) heart failure: Secondary | ICD-10-CM | POA: Diagnosis not present

## 2023-06-29 DIAGNOSIS — X58XXXA Exposure to other specified factors, initial encounter: Secondary | ICD-10-CM | POA: Diagnosis not present

## 2023-06-29 DIAGNOSIS — Z942 Lung transplant status: Secondary | ICD-10-CM | POA: Diagnosis not present

## 2023-06-29 DIAGNOSIS — J189 Pneumonia, unspecified organism: Secondary | ICD-10-CM | POA: Diagnosis not present

## 2023-06-29 DIAGNOSIS — R0602 Shortness of breath: Secondary | ICD-10-CM | POA: Diagnosis not present

## 2023-06-29 DIAGNOSIS — I9789 Other postprocedural complications and disorders of the circulatory system, not elsewhere classified: Secondary | ICD-10-CM | POA: Diagnosis not present

## 2023-06-29 DIAGNOSIS — A319 Mycobacterial infection, unspecified: Secondary | ICD-10-CM | POA: Diagnosis not present

## 2023-06-29 DIAGNOSIS — N185 Chronic kidney disease, stage 5: Secondary | ICD-10-CM | POA: Diagnosis not present

## 2023-06-29 DIAGNOSIS — Z45018 Encounter for adjustment and management of other part of cardiac pacemaker: Secondary | ICD-10-CM | POA: Diagnosis not present

## 2023-06-29 DIAGNOSIS — E8770 Fluid overload, unspecified: Secondary | ICD-10-CM | POA: Diagnosis not present

## 2023-06-29 DIAGNOSIS — D649 Anemia, unspecified: Secondary | ICD-10-CM | POA: Diagnosis not present

## 2023-06-29 DIAGNOSIS — N19 Unspecified kidney failure: Secondary | ICD-10-CM | POA: Diagnosis not present

## 2023-06-29 DIAGNOSIS — E1165 Type 2 diabetes mellitus with hyperglycemia: Secondary | ICD-10-CM | POA: Diagnosis not present

## 2023-06-30 DIAGNOSIS — J9621 Acute and chronic respiratory failure with hypoxia: Secondary | ICD-10-CM | POA: Diagnosis not present

## 2023-06-30 DIAGNOSIS — B449 Aspergillosis, unspecified: Secondary | ICD-10-CM | POA: Diagnosis not present

## 2023-06-30 DIAGNOSIS — R110 Nausea: Secondary | ICD-10-CM | POA: Diagnosis not present

## 2023-06-30 DIAGNOSIS — I9789 Other postprocedural complications and disorders of the circulatory system, not elsewhere classified: Secondary | ICD-10-CM | POA: Diagnosis not present

## 2023-06-30 DIAGNOSIS — I5022 Chronic systolic (congestive) heart failure: Secondary | ICD-10-CM | POA: Diagnosis not present

## 2023-06-30 DIAGNOSIS — N185 Chronic kidney disease, stage 5: Secondary | ICD-10-CM | POA: Diagnosis not present

## 2023-06-30 DIAGNOSIS — I4891 Unspecified atrial fibrillation: Secondary | ICD-10-CM | POA: Diagnosis not present

## 2023-06-30 DIAGNOSIS — R0602 Shortness of breath: Secondary | ICD-10-CM | POA: Diagnosis not present

## 2023-06-30 DIAGNOSIS — A430 Pulmonary nocardiosis: Secondary | ICD-10-CM | POA: Diagnosis not present

## 2023-06-30 DIAGNOSIS — E8770 Fluid overload, unspecified: Secondary | ICD-10-CM | POA: Diagnosis not present

## 2023-06-30 DIAGNOSIS — T86810 Lung transplant rejection: Secondary | ICD-10-CM | POA: Diagnosis not present

## 2023-06-30 DIAGNOSIS — J4481 Bronchiolitis obliterans and bronchiolitis obliterans syndrome: Secondary | ICD-10-CM | POA: Diagnosis not present

## 2023-06-30 DIAGNOSIS — Z942 Lung transplant status: Secondary | ICD-10-CM | POA: Diagnosis not present

## 2023-06-30 DIAGNOSIS — J189 Pneumonia, unspecified organism: Secondary | ICD-10-CM | POA: Diagnosis not present

## 2023-06-30 DIAGNOSIS — A439 Nocardiosis, unspecified: Secondary | ICD-10-CM | POA: Diagnosis not present

## 2023-06-30 DIAGNOSIS — D649 Anemia, unspecified: Secondary | ICD-10-CM | POA: Diagnosis not present

## 2023-06-30 DIAGNOSIS — N19 Unspecified kidney failure: Secondary | ICD-10-CM | POA: Diagnosis not present

## 2023-06-30 DIAGNOSIS — D849 Immunodeficiency, unspecified: Secondary | ICD-10-CM | POA: Diagnosis not present

## 2023-06-30 DIAGNOSIS — A319 Mycobacterial infection, unspecified: Secondary | ICD-10-CM | POA: Diagnosis not present

## 2023-07-01 DIAGNOSIS — J9621 Acute and chronic respiratory failure with hypoxia: Secondary | ICD-10-CM | POA: Diagnosis not present

## 2023-07-01 DIAGNOSIS — N185 Chronic kidney disease, stage 5: Secondary | ICD-10-CM | POA: Diagnosis not present

## 2023-07-01 DIAGNOSIS — Z942 Lung transplant status: Secondary | ICD-10-CM | POA: Diagnosis not present

## 2023-07-01 DIAGNOSIS — N19 Unspecified kidney failure: Secondary | ICD-10-CM | POA: Diagnosis not present

## 2023-07-01 DIAGNOSIS — E8770 Fluid overload, unspecified: Secondary | ICD-10-CM | POA: Diagnosis not present

## 2023-07-01 DIAGNOSIS — J189 Pneumonia, unspecified organism: Secondary | ICD-10-CM | POA: Diagnosis not present

## 2023-07-02 DIAGNOSIS — J189 Pneumonia, unspecified organism: Secondary | ICD-10-CM | POA: Diagnosis not present

## 2023-07-02 DIAGNOSIS — J9621 Acute and chronic respiratory failure with hypoxia: Secondary | ICD-10-CM | POA: Diagnosis not present

## 2023-07-02 DIAGNOSIS — R109 Unspecified abdominal pain: Secondary | ICD-10-CM | POA: Diagnosis not present

## 2023-07-02 DIAGNOSIS — R112 Nausea with vomiting, unspecified: Secondary | ICD-10-CM | POA: Diagnosis not present

## 2023-07-02 DIAGNOSIS — J4481 Bronchiolitis obliterans and bronchiolitis obliterans syndrome: Secondary | ICD-10-CM | POA: Diagnosis not present

## 2023-07-02 DIAGNOSIS — T86810 Lung transplant rejection: Secondary | ICD-10-CM | POA: Diagnosis not present

## 2023-07-02 DIAGNOSIS — R9431 Abnormal electrocardiogram [ECG] [EKG]: Secondary | ICD-10-CM | POA: Diagnosis not present

## 2023-07-02 DIAGNOSIS — J849 Interstitial pulmonary disease, unspecified: Secondary | ICD-10-CM | POA: Diagnosis not present

## 2023-07-02 DIAGNOSIS — B449 Aspergillosis, unspecified: Secondary | ICD-10-CM | POA: Diagnosis not present

## 2023-07-02 DIAGNOSIS — R140 Abdominal distension (gaseous): Secondary | ICD-10-CM | POA: Diagnosis not present

## 2023-07-02 DIAGNOSIS — Z942 Lung transplant status: Secondary | ICD-10-CM | POA: Diagnosis not present

## 2023-07-02 DIAGNOSIS — J690 Pneumonitis due to inhalation of food and vomit: Secondary | ICD-10-CM | POA: Diagnosis not present

## 2023-07-02 DIAGNOSIS — B4489 Other forms of aspergillosis: Secondary | ICD-10-CM | POA: Diagnosis not present

## 2023-07-02 DIAGNOSIS — K5909 Other constipation: Secondary | ICD-10-CM | POA: Diagnosis not present

## 2023-07-03 DIAGNOSIS — B4489 Other forms of aspergillosis: Secondary | ICD-10-CM | POA: Diagnosis not present

## 2023-07-03 DIAGNOSIS — J9621 Acute and chronic respiratory failure with hypoxia: Secondary | ICD-10-CM | POA: Diagnosis not present

## 2023-07-03 DIAGNOSIS — T86810 Lung transplant rejection: Secondary | ICD-10-CM | POA: Diagnosis not present

## 2023-07-03 DIAGNOSIS — J4481 Bronchiolitis obliterans and bronchiolitis obliterans syndrome: Secondary | ICD-10-CM | POA: Diagnosis not present

## 2023-07-03 DIAGNOSIS — R109 Unspecified abdominal pain: Secondary | ICD-10-CM | POA: Diagnosis not present

## 2023-07-03 DIAGNOSIS — Z942 Lung transplant status: Secondary | ICD-10-CM | POA: Diagnosis not present

## 2023-07-03 DIAGNOSIS — B449 Aspergillosis, unspecified: Secondary | ICD-10-CM | POA: Diagnosis not present

## 2023-07-03 DIAGNOSIS — J101 Influenza due to other identified influenza virus with other respiratory manifestations: Secondary | ICD-10-CM | POA: Diagnosis not present

## 2023-07-03 DIAGNOSIS — A439 Nocardiosis, unspecified: Secondary | ICD-10-CM | POA: Diagnosis not present

## 2023-07-03 DIAGNOSIS — J690 Pneumonitis due to inhalation of food and vomit: Secondary | ICD-10-CM | POA: Diagnosis not present

## 2023-07-03 DIAGNOSIS — J849 Interstitial pulmonary disease, unspecified: Secondary | ICD-10-CM | POA: Diagnosis not present

## 2023-07-03 DIAGNOSIS — D849 Immunodeficiency, unspecified: Secondary | ICD-10-CM | POA: Diagnosis not present

## 2023-07-03 DIAGNOSIS — N184 Chronic kidney disease, stage 4 (severe): Secondary | ICD-10-CM | POA: Diagnosis not present

## 2023-07-03 DIAGNOSIS — R112 Nausea with vomiting, unspecified: Secondary | ICD-10-CM | POA: Diagnosis not present

## 2023-07-03 DIAGNOSIS — J189 Pneumonia, unspecified organism: Secondary | ICD-10-CM | POA: Diagnosis not present

## 2023-07-04 DIAGNOSIS — J9620 Acute and chronic respiratory failure, unspecified whether with hypoxia or hypercapnia: Secondary | ICD-10-CM | POA: Diagnosis not present

## 2023-07-04 DIAGNOSIS — K219 Gastro-esophageal reflux disease without esophagitis: Secondary | ICD-10-CM | POA: Diagnosis not present

## 2023-07-04 DIAGNOSIS — D849 Immunodeficiency, unspecified: Secondary | ICD-10-CM | POA: Diagnosis not present

## 2023-07-04 DIAGNOSIS — J849 Interstitial pulmonary disease, unspecified: Secondary | ICD-10-CM | POA: Diagnosis not present

## 2023-07-04 DIAGNOSIS — I5022 Chronic systolic (congestive) heart failure: Secondary | ICD-10-CM | POA: Diagnosis not present

## 2023-07-04 DIAGNOSIS — T86810 Lung transplant rejection: Secondary | ICD-10-CM | POA: Diagnosis not present

## 2023-07-04 DIAGNOSIS — N179 Acute kidney failure, unspecified: Secondary | ICD-10-CM | POA: Diagnosis not present

## 2023-07-04 DIAGNOSIS — A430 Pulmonary nocardiosis: Secondary | ICD-10-CM | POA: Diagnosis not present

## 2023-07-04 DIAGNOSIS — J9621 Acute and chronic respiratory failure with hypoxia: Secondary | ICD-10-CM | POA: Diagnosis not present

## 2023-07-04 DIAGNOSIS — R109 Unspecified abdominal pain: Secondary | ICD-10-CM | POA: Diagnosis not present

## 2023-07-04 DIAGNOSIS — J101 Influenza due to other identified influenza virus with other respiratory manifestations: Secondary | ICD-10-CM | POA: Diagnosis not present

## 2023-07-04 DIAGNOSIS — J4481 Bronchiolitis obliterans and bronchiolitis obliterans syndrome: Secondary | ICD-10-CM | POA: Diagnosis not present

## 2023-07-04 DIAGNOSIS — D500 Iron deficiency anemia secondary to blood loss (chronic): Secondary | ICD-10-CM | POA: Diagnosis not present

## 2023-07-04 DIAGNOSIS — A439 Nocardiosis, unspecified: Secondary | ICD-10-CM | POA: Diagnosis not present

## 2023-07-04 DIAGNOSIS — N184 Chronic kidney disease, stage 4 (severe): Secondary | ICD-10-CM | POA: Diagnosis not present

## 2023-07-04 DIAGNOSIS — B441 Other pulmonary aspergillosis: Secondary | ICD-10-CM | POA: Diagnosis not present

## 2023-07-04 DIAGNOSIS — J189 Pneumonia, unspecified organism: Secondary | ICD-10-CM | POA: Diagnosis not present

## 2023-07-04 DIAGNOSIS — I130 Hypertensive heart and chronic kidney disease with heart failure and stage 1 through stage 4 chronic kidney disease, or unspecified chronic kidney disease: Secondary | ICD-10-CM | POA: Diagnosis not present

## 2023-07-04 DIAGNOSIS — Z942 Lung transplant status: Secondary | ICD-10-CM | POA: Diagnosis not present

## 2023-07-04 DIAGNOSIS — B4489 Other forms of aspergillosis: Secondary | ICD-10-CM | POA: Diagnosis not present

## 2023-07-04 DIAGNOSIS — G4733 Obstructive sleep apnea (adult) (pediatric): Secondary | ICD-10-CM | POA: Diagnosis not present

## 2023-07-04 DIAGNOSIS — N400 Enlarged prostate without lower urinary tract symptoms: Secondary | ICD-10-CM | POA: Diagnosis not present

## 2023-07-04 DIAGNOSIS — E785 Hyperlipidemia, unspecified: Secondary | ICD-10-CM | POA: Diagnosis not present

## 2023-07-05 ENCOUNTER — Ambulatory Visit: Payer: Medicare Other | Admitting: Urology

## 2023-07-05 DIAGNOSIS — J690 Pneumonitis due to inhalation of food and vomit: Secondary | ICD-10-CM | POA: Diagnosis not present

## 2023-07-05 DIAGNOSIS — G4733 Obstructive sleep apnea (adult) (pediatric): Secondary | ICD-10-CM | POA: Diagnosis not present

## 2023-07-05 DIAGNOSIS — R0602 Shortness of breath: Secondary | ICD-10-CM | POA: Diagnosis not present

## 2023-07-05 DIAGNOSIS — I428 Other cardiomyopathies: Secondary | ICD-10-CM | POA: Diagnosis not present

## 2023-07-05 DIAGNOSIS — J9621 Acute and chronic respiratory failure with hypoxia: Secondary | ICD-10-CM | POA: Diagnosis not present

## 2023-07-05 DIAGNOSIS — N401 Enlarged prostate with lower urinary tract symptoms: Secondary | ICD-10-CM

## 2023-07-05 DIAGNOSIS — K219 Gastro-esophageal reflux disease without esophagitis: Secondary | ICD-10-CM | POA: Diagnosis not present

## 2023-07-05 DIAGNOSIS — J122 Parainfluenza virus pneumonia: Secondary | ICD-10-CM | POA: Diagnosis not present

## 2023-07-05 DIAGNOSIS — J189 Pneumonia, unspecified organism: Secondary | ICD-10-CM | POA: Diagnosis not present

## 2023-07-05 DIAGNOSIS — N184 Chronic kidney disease, stage 4 (severe): Secondary | ICD-10-CM | POA: Diagnosis not present

## 2023-07-05 DIAGNOSIS — A430 Pulmonary nocardiosis: Secondary | ICD-10-CM | POA: Diagnosis not present

## 2023-07-05 DIAGNOSIS — I130 Hypertensive heart and chronic kidney disease with heart failure and stage 1 through stage 4 chronic kidney disease, or unspecified chronic kidney disease: Secondary | ICD-10-CM | POA: Diagnosis not present

## 2023-07-05 DIAGNOSIS — B4489 Other forms of aspergillosis: Secondary | ICD-10-CM | POA: Diagnosis not present

## 2023-07-05 DIAGNOSIS — R109 Unspecified abdominal pain: Secondary | ICD-10-CM | POA: Diagnosis not present

## 2023-07-05 DIAGNOSIS — Z942 Lung transplant status: Secondary | ICD-10-CM | POA: Diagnosis not present

## 2023-07-05 DIAGNOSIS — J849 Interstitial pulmonary disease, unspecified: Secondary | ICD-10-CM | POA: Diagnosis not present

## 2023-07-05 DIAGNOSIS — N179 Acute kidney failure, unspecified: Secondary | ICD-10-CM | POA: Diagnosis not present

## 2023-07-05 DIAGNOSIS — B449 Aspergillosis, unspecified: Secondary | ICD-10-CM | POA: Diagnosis not present

## 2023-07-05 DIAGNOSIS — J101 Influenza due to other identified influenza virus with other respiratory manifestations: Secondary | ICD-10-CM | POA: Diagnosis not present

## 2023-07-05 DIAGNOSIS — N400 Enlarged prostate without lower urinary tract symptoms: Secondary | ICD-10-CM | POA: Diagnosis not present

## 2023-07-05 DIAGNOSIS — T86810 Lung transplant rejection: Secondary | ICD-10-CM | POA: Diagnosis not present

## 2023-07-05 DIAGNOSIS — J9620 Acute and chronic respiratory failure, unspecified whether with hypoxia or hypercapnia: Secondary | ICD-10-CM | POA: Diagnosis not present

## 2023-07-05 DIAGNOSIS — D849 Immunodeficiency, unspecified: Secondary | ICD-10-CM | POA: Diagnosis not present

## 2023-07-05 DIAGNOSIS — N186 End stage renal disease: Secondary | ICD-10-CM | POA: Diagnosis not present

## 2023-07-05 DIAGNOSIS — Z992 Dependence on renal dialysis: Secondary | ICD-10-CM | POA: Diagnosis not present

## 2023-07-05 DIAGNOSIS — E785 Hyperlipidemia, unspecified: Secondary | ICD-10-CM | POA: Diagnosis not present

## 2023-07-05 DIAGNOSIS — D500 Iron deficiency anemia secondary to blood loss (chronic): Secondary | ICD-10-CM | POA: Diagnosis not present

## 2023-07-05 DIAGNOSIS — J4481 Bronchiolitis obliterans and bronchiolitis obliterans syndrome: Secondary | ICD-10-CM | POA: Diagnosis not present

## 2023-07-05 DIAGNOSIS — A319 Mycobacterial infection, unspecified: Secondary | ICD-10-CM | POA: Diagnosis not present

## 2023-07-05 DIAGNOSIS — N390 Urinary tract infection, site not specified: Secondary | ICD-10-CM

## 2023-07-05 DIAGNOSIS — R339 Retention of urine, unspecified: Secondary | ICD-10-CM

## 2023-07-05 DIAGNOSIS — A439 Nocardiosis, unspecified: Secondary | ICD-10-CM | POA: Diagnosis not present

## 2023-07-05 DIAGNOSIS — I498 Other specified cardiac arrhythmias: Secondary | ICD-10-CM | POA: Diagnosis not present

## 2023-07-05 DIAGNOSIS — Z960 Presence of urogenital implants: Secondary | ICD-10-CM

## 2023-07-05 DIAGNOSIS — I5033 Acute on chronic diastolic (congestive) heart failure: Secondary | ICD-10-CM | POA: Diagnosis not present

## 2023-07-05 DIAGNOSIS — R9389 Abnormal findings on diagnostic imaging of other specified body structures: Secondary | ICD-10-CM | POA: Diagnosis not present

## 2023-07-05 DIAGNOSIS — I132 Hypertensive heart and chronic kidney disease with heart failure and with stage 5 chronic kidney disease, or end stage renal disease: Secondary | ICD-10-CM | POA: Diagnosis not present

## 2023-07-05 DIAGNOSIS — J4A9 Chronic lung allograft dysfunction, unspecified: Secondary | ICD-10-CM | POA: Diagnosis not present

## 2023-07-05 DIAGNOSIS — I5022 Chronic systolic (congestive) heart failure: Secondary | ICD-10-CM | POA: Diagnosis not present

## 2023-07-06 DIAGNOSIS — T86810 Lung transplant rejection: Secondary | ICD-10-CM | POA: Diagnosis not present

## 2023-07-06 DIAGNOSIS — M25451 Effusion, right hip: Secondary | ICD-10-CM | POA: Diagnosis not present

## 2023-07-06 DIAGNOSIS — J189 Pneumonia, unspecified organism: Secondary | ICD-10-CM | POA: Diagnosis not present

## 2023-07-06 DIAGNOSIS — E785 Hyperlipidemia, unspecified: Secondary | ICD-10-CM | POA: Diagnosis not present

## 2023-07-06 DIAGNOSIS — N400 Enlarged prostate without lower urinary tract symptoms: Secondary | ICD-10-CM | POA: Diagnosis not present

## 2023-07-06 DIAGNOSIS — M1711 Unilateral primary osteoarthritis, right knee: Secondary | ICD-10-CM | POA: Diagnosis not present

## 2023-07-06 DIAGNOSIS — Z942 Lung transplant status: Secondary | ICD-10-CM | POA: Diagnosis not present

## 2023-07-06 DIAGNOSIS — R109 Unspecified abdominal pain: Secondary | ICD-10-CM | POA: Diagnosis not present

## 2023-07-06 DIAGNOSIS — A439 Nocardiosis, unspecified: Secondary | ICD-10-CM | POA: Diagnosis not present

## 2023-07-06 DIAGNOSIS — J9621 Acute and chronic respiratory failure with hypoxia: Secondary | ICD-10-CM | POA: Diagnosis not present

## 2023-07-06 DIAGNOSIS — J101 Influenza due to other identified influenza virus with other respiratory manifestations: Secondary | ICD-10-CM | POA: Diagnosis not present

## 2023-07-06 DIAGNOSIS — D849 Immunodeficiency, unspecified: Secondary | ICD-10-CM | POA: Diagnosis not present

## 2023-07-06 DIAGNOSIS — E8770 Fluid overload, unspecified: Secondary | ICD-10-CM | POA: Diagnosis not present

## 2023-07-06 DIAGNOSIS — N184 Chronic kidney disease, stage 4 (severe): Secondary | ICD-10-CM | POA: Diagnosis not present

## 2023-07-06 DIAGNOSIS — B4489 Other forms of aspergillosis: Secondary | ICD-10-CM | POA: Diagnosis not present

## 2023-07-06 DIAGNOSIS — R0602 Shortness of breath: Secondary | ICD-10-CM | POA: Diagnosis not present

## 2023-07-06 DIAGNOSIS — G4733 Obstructive sleep apnea (adult) (pediatric): Secondary | ICD-10-CM | POA: Diagnosis not present

## 2023-07-06 DIAGNOSIS — N179 Acute kidney failure, unspecified: Secondary | ICD-10-CM | POA: Diagnosis not present

## 2023-07-06 DIAGNOSIS — J9620 Acute and chronic respiratory failure, unspecified whether with hypoxia or hypercapnia: Secondary | ICD-10-CM | POA: Diagnosis not present

## 2023-07-06 DIAGNOSIS — N185 Chronic kidney disease, stage 5: Secondary | ICD-10-CM | POA: Diagnosis not present

## 2023-07-06 DIAGNOSIS — K219 Gastro-esophageal reflux disease without esophagitis: Secondary | ICD-10-CM | POA: Diagnosis not present

## 2023-07-06 DIAGNOSIS — D500 Iron deficiency anemia secondary to blood loss (chronic): Secondary | ICD-10-CM | POA: Diagnosis not present

## 2023-07-06 DIAGNOSIS — E1165 Type 2 diabetes mellitus with hyperglycemia: Secondary | ICD-10-CM | POA: Diagnosis not present

## 2023-07-06 DIAGNOSIS — I5022 Chronic systolic (congestive) heart failure: Secondary | ICD-10-CM | POA: Diagnosis not present

## 2023-07-06 DIAGNOSIS — J4481 Bronchiolitis obliterans and bronchiolitis obliterans syndrome: Secondary | ICD-10-CM | POA: Diagnosis not present

## 2023-07-06 DIAGNOSIS — I130 Hypertensive heart and chronic kidney disease with heart failure and stage 1 through stage 4 chronic kidney disease, or unspecified chronic kidney disease: Secondary | ICD-10-CM | POA: Diagnosis not present

## 2023-07-07 DIAGNOSIS — E785 Hyperlipidemia, unspecified: Secondary | ICD-10-CM | POA: Diagnosis not present

## 2023-07-07 DIAGNOSIS — J9620 Acute and chronic respiratory failure, unspecified whether with hypoxia or hypercapnia: Secondary | ICD-10-CM | POA: Diagnosis not present

## 2023-07-07 DIAGNOSIS — K219 Gastro-esophageal reflux disease without esophagitis: Secondary | ICD-10-CM | POA: Diagnosis not present

## 2023-07-07 DIAGNOSIS — N400 Enlarged prostate without lower urinary tract symptoms: Secondary | ICD-10-CM | POA: Diagnosis not present

## 2023-07-07 DIAGNOSIS — R0602 Shortness of breath: Secondary | ICD-10-CM | POA: Diagnosis not present

## 2023-07-07 DIAGNOSIS — I5022 Chronic systolic (congestive) heart failure: Secondary | ICD-10-CM | POA: Diagnosis not present

## 2023-07-07 DIAGNOSIS — A310 Pulmonary mycobacterial infection: Secondary | ICD-10-CM | POA: Diagnosis not present

## 2023-07-07 DIAGNOSIS — R918 Other nonspecific abnormal finding of lung field: Secondary | ICD-10-CM | POA: Diagnosis not present

## 2023-07-07 DIAGNOSIS — G4733 Obstructive sleep apnea (adult) (pediatric): Secondary | ICD-10-CM | POA: Diagnosis not present

## 2023-07-07 DIAGNOSIS — B964 Proteus (mirabilis) (morganii) as the cause of diseases classified elsewhere: Secondary | ICD-10-CM | POA: Diagnosis not present

## 2023-07-07 DIAGNOSIS — J101 Influenza due to other identified influenza virus with other respiratory manifestations: Secondary | ICD-10-CM | POA: Diagnosis not present

## 2023-07-07 DIAGNOSIS — B441 Other pulmonary aspergillosis: Secondary | ICD-10-CM | POA: Diagnosis not present

## 2023-07-07 DIAGNOSIS — Z4659 Encounter for fitting and adjustment of other gastrointestinal appliance and device: Secondary | ICD-10-CM | POA: Diagnosis not present

## 2023-07-07 DIAGNOSIS — J9621 Acute and chronic respiratory failure with hypoxia: Secondary | ICD-10-CM | POA: Diagnosis not present

## 2023-07-07 DIAGNOSIS — S8011XA Contusion of right lower leg, initial encounter: Secondary | ICD-10-CM | POA: Diagnosis not present

## 2023-07-07 DIAGNOSIS — E1165 Type 2 diabetes mellitus with hyperglycemia: Secondary | ICD-10-CM | POA: Diagnosis not present

## 2023-07-07 DIAGNOSIS — N184 Chronic kidney disease, stage 4 (severe): Secondary | ICD-10-CM | POA: Diagnosis not present

## 2023-07-07 DIAGNOSIS — D500 Iron deficiency anemia secondary to blood loss (chronic): Secondary | ICD-10-CM | POA: Diagnosis not present

## 2023-07-07 DIAGNOSIS — R112 Nausea with vomiting, unspecified: Secondary | ICD-10-CM | POA: Diagnosis not present

## 2023-07-07 DIAGNOSIS — I130 Hypertensive heart and chronic kidney disease with heart failure and stage 1 through stage 4 chronic kidney disease, or unspecified chronic kidney disease: Secondary | ICD-10-CM | POA: Diagnosis not present

## 2023-07-07 DIAGNOSIS — Z942 Lung transplant status: Secondary | ICD-10-CM | POA: Diagnosis not present

## 2023-07-07 DIAGNOSIS — B484 Penicillosis: Secondary | ICD-10-CM | POA: Diagnosis not present

## 2023-07-07 DIAGNOSIS — N179 Acute kidney failure, unspecified: Secondary | ICD-10-CM | POA: Diagnosis not present

## 2023-07-07 DIAGNOSIS — X58XXXA Exposure to other specified factors, initial encounter: Secondary | ICD-10-CM | POA: Diagnosis not present

## 2023-07-08 DIAGNOSIS — N179 Acute kidney failure, unspecified: Secondary | ICD-10-CM | POA: Diagnosis not present

## 2023-07-08 DIAGNOSIS — E1165 Type 2 diabetes mellitus with hyperglycemia: Secondary | ICD-10-CM | POA: Diagnosis not present

## 2023-07-08 DIAGNOSIS — N184 Chronic kidney disease, stage 4 (severe): Secondary | ICD-10-CM | POA: Diagnosis not present

## 2023-07-09 DIAGNOSIS — E1165 Type 2 diabetes mellitus with hyperglycemia: Secondary | ICD-10-CM | POA: Diagnosis not present

## 2023-07-10 DIAGNOSIS — J189 Pneumonia, unspecified organism: Secondary | ICD-10-CM | POA: Diagnosis not present

## 2023-07-10 DIAGNOSIS — E1165 Type 2 diabetes mellitus with hyperglycemia: Secondary | ICD-10-CM | POA: Diagnosis not present

## 2023-07-10 DIAGNOSIS — N189 Chronic kidney disease, unspecified: Secondary | ICD-10-CM | POA: Diagnosis not present

## 2023-07-10 DIAGNOSIS — N179 Acute kidney failure, unspecified: Secondary | ICD-10-CM | POA: Diagnosis not present

## 2023-07-11 DIAGNOSIS — E1165 Type 2 diabetes mellitus with hyperglycemia: Secondary | ICD-10-CM | POA: Diagnosis not present

## 2023-07-11 DIAGNOSIS — J329 Chronic sinusitis, unspecified: Secondary | ICD-10-CM | POA: Diagnosis not present

## 2023-07-11 DIAGNOSIS — A439 Nocardiosis, unspecified: Secondary | ICD-10-CM | POA: Diagnosis not present

## 2023-07-11 DIAGNOSIS — N179 Acute kidney failure, unspecified: Secondary | ICD-10-CM | POA: Diagnosis not present

## 2023-07-11 DIAGNOSIS — N189 Chronic kidney disease, unspecified: Secondary | ICD-10-CM | POA: Diagnosis not present

## 2023-07-11 DIAGNOSIS — H748X3 Other specified disorders of middle ear and mastoid, bilateral: Secondary | ICD-10-CM | POA: Diagnosis not present

## 2023-07-12 DIAGNOSIS — R112 Nausea with vomiting, unspecified: Secondary | ICD-10-CM | POA: Diagnosis not present

## 2023-07-12 DIAGNOSIS — E1165 Type 2 diabetes mellitus with hyperglycemia: Secondary | ICD-10-CM | POA: Diagnosis not present

## 2023-07-13 DIAGNOSIS — N179 Acute kidney failure, unspecified: Secondary | ICD-10-CM | POA: Diagnosis not present

## 2023-07-13 DIAGNOSIS — N189 Chronic kidney disease, unspecified: Secondary | ICD-10-CM | POA: Diagnosis not present

## 2023-07-14 DIAGNOSIS — N189 Chronic kidney disease, unspecified: Secondary | ICD-10-CM | POA: Diagnosis not present

## 2023-07-14 DIAGNOSIS — N179 Acute kidney failure, unspecified: Secondary | ICD-10-CM | POA: Diagnosis not present

## 2023-07-29 DEATH — deceased

## 2024-04-15 ENCOUNTER — Ambulatory Visit: Payer: Medicare Other | Admitting: Nurse Practitioner

## 2024-05-01 ENCOUNTER — Telehealth: Payer: Self-pay | Admitting: Nurse Practitioner

## 2024-05-01 NOTE — Telephone Encounter (Signed)
 Lvm with spouse to reschedule 04/15/2024 missed appointment-Toni
